# Patient Record
Sex: Female | Born: 1970
Health system: Southern US, Community
[De-identification: ages and names within clinical notes are randomized; demographics above are authoritative.]

## PROBLEM LIST (undated history)

## (undated) DIAGNOSIS — E079 Disorder of thyroid, unspecified: Secondary | ICD-10-CM

## (undated) DIAGNOSIS — J45909 Unspecified asthma, uncomplicated: Secondary | ICD-10-CM

## (undated) DIAGNOSIS — E119 Type 2 diabetes mellitus without complications: Secondary | ICD-10-CM

## (undated) DIAGNOSIS — L0390 Cellulitis, unspecified: Secondary | ICD-10-CM

## (undated) DIAGNOSIS — B86 Scabies: Secondary | ICD-10-CM

## (undated) HISTORY — DX: Disorder of thyroid, unspecified: E07.9

---

## 2001-04-06 ENCOUNTER — Emergency Department (HOSPITAL_COMMUNITY): Admission: EM | Admit: 2001-04-06 | Discharge: 2001-04-06 | Payer: Self-pay | Admitting: Emergency Medicine

## 2003-09-08 ENCOUNTER — Emergency Department (HOSPITAL_COMMUNITY): Admission: EM | Admit: 2003-09-08 | Discharge: 2003-09-08 | Payer: Self-pay | Admitting: Emergency Medicine

## 2015-07-20 ENCOUNTER — Emergency Department (HOSPITAL_COMMUNITY)
Admission: EM | Admit: 2015-07-20 | Discharge: 2015-07-20 | Discharge status: Against Medical Advice | Payer: Self-pay | Attending: Emergency Medicine | Admitting: Emergency Medicine

## 2015-07-20 ENCOUNTER — Encounter (HOSPITAL_COMMUNITY): Payer: Self-pay | Admitting: Emergency Medicine

## 2015-07-20 DIAGNOSIS — R21 Rash and other nonspecific skin eruption: Secondary | ICD-10-CM | POA: Insufficient documentation

## 2015-07-20 DIAGNOSIS — J45909 Unspecified asthma, uncomplicated: Secondary | ICD-10-CM | POA: Insufficient documentation

## 2015-07-20 DIAGNOSIS — R000 Tachycardia, unspecified: Secondary | ICD-10-CM | POA: Insufficient documentation

## 2015-07-20 DIAGNOSIS — L299 Pruritus, unspecified: Secondary | ICD-10-CM | POA: Insufficient documentation

## 2015-07-20 DIAGNOSIS — Z720 Tobacco use: Secondary | ICD-10-CM | POA: Insufficient documentation

## 2015-07-20 HISTORY — DX: Unspecified asthma, uncomplicated: J45.909

## 2015-07-20 LAB — CBG MONITORING, ED: Glucose-Capillary: 142 mg/dL — ABNORMAL HIGH (ref 65–99)

## 2015-07-20 NOTE — ED Provider Notes (Signed)
CSN: [id number]     Arrival date & time 07/20/15  1417 History   First MD Initiated Contact with Patient 07/20/15 1520     Chief Complaint  Patient presents with  . Rash   HPI Pt has had trouble with a rah in lower extremity for a few months.  The rash is itchy and raised.  It recently in the last couple of weeks started involving her arms.  There are raised area there as well that itch.  No trouble with her breathing.  No known exposure to chemicals.  NO history of rash problems in the past.  No shortness of breath.  No fever or pain.  She has also noticed swelling in her ankle areas recently. Past Medical History  Diagnosis Date  . Asthma    History reviewed. No pertinent past surgical history. No family history on file. Social History  Substance Use Topics  . Smoking status: Current Every Day Smoker -- 0.50 packs/day    Types: Cigarettes  . Smokeless tobacco: None  . Alcohol Use: Yes     Comment: occ   OB History    No data available     Review of Systems  Constitutional: Negative for fever.  Respiratory: Negative for chest tightness.   Cardiovascular: Negative for chest pain.  Genitourinary: Negative for dysuria.  All other systems reviewed and are negative.     Allergies  Review of patient's allergies indicates no known allergies.  Home Medications   Prior to Admission medications   Medication Sig Start Date End Date Taking? Authorizing Provider  diphenhydrAMINE (BENADRYL) 25 mg capsule Take 50 mg by mouth every 6 (six) hours as needed for itching.   Yes Historical Provider, MD  ibuprofen (ADVIL,MOTRIN) 200 MG tablet Take 600 mg by mouth every 8 (eight) hours as needed for mild pain.   Yes Historical Provider, MD   BP 142/83 mmHg  Pulse 127  Temp(Src) 98.2 F (36.8 C) (Oral)  Resp 18  Ht  (1.6 m)  Wt 140 lb (63.504 kg)  BMI 24.81 kg/m2  SpO2 98%  LMP 07/18/2015 Physical Exam  Constitutional: She appears well-developed and well-nourished. No  distress.  HENT:  Head: Normocephalic and atraumatic.  Right Ear: External ear normal.  Left Ear: External ear normal.  Eyes: Conjunctivae are normal. Right eye exhibits no discharge. Left eye exhibits no discharge. No scleral icterus.  Neck: Neck supple. No tracheal deviation present.  Cardiovascular: Regular rhythm and intact distal pulses.  Tachycardia present.   Pulmonary/Chest: Effort normal and breath sounds normal. No stridor. No respiratory distress. She has no wheezes. She has no rales.  Abdominal: Soft. Bowel sounds are normal. She exhibits no distension. There is no tenderness. There is no rebound and no guarding.  Musculoskeletal: She exhibits no edema or tenderness.  Neurological: She is alert. She has normal strength. No cranial nerve deficit (no facial droop, extraocular movements intact, no slurred speech) or sensory deficit. She exhibits normal muscle tone. She displays no seizure activity. Coordination normal.  Skin: Skin is warm and dry. Rash noted. No bruising, no ecchymosis and no petechiae noted. Rash is papular. Rash is not pustular, not vesicular and not urticarial. No erythema.  Multiple raised areas on upper and lower extremities, areas of excoriation on the lower extremities  Psychiatric: She has a normal mood and affect.  Nursing note and vitals reviewed.   ED Course  Procedures (including critical care time) Labs Review   MDM   I am not  certain what is causing the patient's rash.  I think she would benefit from seeing a dermatologist.  I suggested this to the patient but she does not have insurance.  The leg swelling is most likely related to her skin rash.  No sign of infection.  She was tachycardic.  Will basic labscheck labs and EKG to assess liver and renal function.  After I left the room, the patient ended up deciding to leave.  She did not get any of her tests done     Linwood Dibbles, MD 07/20/15 [phone]

## 2015-07-20 NOTE — ED Notes (Addendum)
Pt reports bilateral lower extremity edema x 2 weeks. Pt noted to have multiple wounds to bilateral feet. Pt unsure if she is diabetic. Pt also reports an itchy, raised rash to bilateral upper/ lower extremities and chin. Denies SOB. CBG 142

## 2015-07-20 NOTE — ED Notes (Signed)
Patient and family states "doctor came into room and said he didn't know much about rashes." Patient refused EKG/blood work. Patient informed Clinical research associate she is going to [facility] for treatment. Writer offered to let patient/family speak with doctor/charge nurse- patient/family refused. Patient stable and ambulatory at discharge.

## 2015-08-14 ENCOUNTER — Emergency Department (HOSPITAL_COMMUNITY)
Admission: EM | Admit: 2015-08-14 | Discharge: 2015-08-15 | Disposition: A | Discharge status: Home / Self Care | Payer: Self-pay | Attending: Emergency Medicine | Admitting: Emergency Medicine

## 2015-08-14 ENCOUNTER — Encounter (HOSPITAL_COMMUNITY): Payer: Self-pay | Admitting: Emergency Medicine

## 2015-08-14 DIAGNOSIS — R21 Rash and other nonspecific skin eruption: Secondary | ICD-10-CM | POA: Insufficient documentation

## 2015-08-14 DIAGNOSIS — J45901 Unspecified asthma with (acute) exacerbation: Secondary | ICD-10-CM | POA: Insufficient documentation

## 2015-08-14 DIAGNOSIS — F17210 Nicotine dependence, cigarettes, uncomplicated: Secondary | ICD-10-CM | POA: Insufficient documentation

## 2015-08-14 DIAGNOSIS — R220 Localized swelling, mass and lump, head: Secondary | ICD-10-CM | POA: Insufficient documentation

## 2015-08-14 NOTE — ED Notes (Signed)
Pt states she woke thinking her throat was closing up and rash all over. Pt was diagnosed with scabies x2 weeks ago.

## 2015-08-14 NOTE — ED Provider Notes (Signed)
CSN: [id number]     Arrival date & time 08/14/15  2350 History  By signing my name below, I, Budd Palmer, attest that this documentation has been prepared under the direction and in the presence of Loren Racer, MD. Electronically Signed: Budd Palmer, ED Scribe. 08/15/2015. 12:15 AM.     Chief Complaint  Patient presents with  . Allergic Reaction   The history is provided by the patient. No language interpreter was used.   HPI Comments: Linda Lin is a 44 y.o. female smoker at 0.5 ppd with a PMHx of asthma who presents to the Emergency Department complaining of an allergic reaction onset 1 day ago. She notes she was diagnosed with scabies 2 weeks ago. Was given permethrin cream. Rash improved. States she began noticing a similar rash 2 days ago starting on her hands and progressing up her arms. Also noted to the top of her chest, upper abdomen and face.  She notes she woke up 24 hours ago with the sensation of tongue swelling, and woke up with the same tonight, which is why she came to the ED. She notes she has been washing and cleaning around the house. Denies shortness of breath. No nausea or vomiting.  Past Medical History  Diagnosis Date  . Asthma    History reviewed. No pertinent past surgical history. No family history on file. Social History  Substance Use Topics  . Smoking status: Current Every Day Smoker -- 0.50 packs/day    Types: Cigarettes  . Smokeless tobacco: None  . Alcohol Use: Yes     Comment: occ   OB History    No data available     Review of Systems  Constitutional: Negative for fever and chills.  HENT: Negative for facial swelling and trouble swallowing.   Respiratory: Positive for wheezing. Negative for cough and shortness of breath.   Gastrointestinal: Negative for nausea, vomiting and abdominal pain.  Musculoskeletal: Negative for back pain, neck pain and neck stiffness.  Skin: Positive for rash.  Neurological: Negative for dizziness,  weakness, light-headedness, numbness and headaches.  All other systems reviewed and are negative.   Allergies  Review of patient's allergies indicates no known allergies.  Home Medications   Prior to Admission medications   Medication Sig Start Date End Date Taking? Authorizing Provider  diphenhydrAMINE (BENADRYL) 25 mg capsule Take 50 mg by mouth every 6 (six) hours as needed for itching.   Yes Historical Provider, MD  ibuprofen (ADVIL,MOTRIN) 200 MG tablet Take 600 mg by mouth every 8 (eight) hours as needed for mild pain.   Yes Historical Provider, MD  famotidine (PEPCID) 20 MG tablet Take 1 tablet (20 mg total) by mouth daily. 08/15/15   Loren Racer, MD  predniSONE (DELTASONE) 50 MG tablet Take 1 tablet daily 5 days 08/15/15   Loren Racer, MD  pyrethrins-piperonyl butoxide 0.5 % bottle Apply topically once. 08/15/15   Loren Racer, MD   BP 130/84 mmHg  Pulse 107  Temp(Src) 98.1 F (36.7 C) (Oral)  Resp 20  Ht 5\' 3"  (1.6 m)  Wt 139 lb (63.05 kg)  BMI 24.63 kg/m2  SpO2 97%  LMP 07/10/2015 Physical Exam  Constitutional: She is oriented to person, place, and time. She appears well-developed and well-nourished. No distress.  HENT:  Head: Normocephalic and atraumatic.  Mouth/Throat: Oropharynx is clear and moist.  No intraoral swelling or facial swelling. Patient speaks in a clear voice.  Eyes: EOM are normal. Pupils are equal, round, and reactive to light.  Neck: Normal range of motion. Neck supple.  Cardiovascular: Regular rhythm.  Exam reveals no gallop and no friction rub.   No murmur heard. Tachycardia  Pulmonary/Chest: Effort normal. No stridor. No respiratory distress. She has wheezes (few scattered expiratory wheezes). She has no rales.  Abdominal: Soft. Bowel sounds are normal. She exhibits no distension and no mass. There is no tenderness. There is no rebound and no guarding.  Musculoskeletal: Normal range of motion. She exhibits no edema or tenderness.   Neurological: She is alert and oriented to person, place, and time.  Moves all extremities without deficit. Sensation is fully intact.  Skin: Skin is warm and dry. Rash noted. No erythema.  Patient has papular lesions on bilateral hands and forearms. Also noted on face and chest. No erythema or evidence of infectious process. No interdigital lesions.   Psychiatric: She has a normal mood and affect. Her behavior is normal.  Nursing note and vitals reviewed.   ED Course  Procedures  DIAGNOSTIC STUDIES: Oxygen Saturation is 100% on RA, normal by my interpretation.    COORDINATION OF CARE: 12:14 AM - Discussed plans to order 2 doses of promethazine creme. Pt advised of plan for treatment and pt agrees.  Labs Review Labs Reviewed - No data to display  Imaging Review No results found. I have personally reviewed and evaluated these images and lab results as part of my medical decision-making.   EKG Interpretation None      MDM   Final diagnoses:  Rash and nonspecific skin eruption    I personally performed the services described in this documentation, which was scribed in my presence. The recorded information has been reviewed and is accurate.   Patient treated for possible allergic reaction. No evidence of intraoral swelling or airway compromise. Patient states sensation of tongue swelling has abated. We'll re-dose permethrin cream for diffuse rash. Patient has been advised that she needs to thoroughly clean all linens, clothes. Return precautions given.  Loren Racer, MD 08/15/15 [phone]

## 2015-08-15 MED ORDER — METHYLPREDNISOLONE SODIUM SUCC 125 MG IJ SOLR
125.0000 mg | Freq: Once | INTRAMUSCULAR | Status: AC
  Administered 2015-08-15: 125 mg via INTRAMUSCULAR
  Filled 2015-08-15: qty 2

## 2015-08-15 MED ORDER — PREDNISONE 50 MG PO TABS: ORAL_TABLET | ORAL | Status: DC

## 2015-08-15 MED ORDER — PERMETHRIN 0.5 % AERO: INHALATION_SPRAY | Freq: Once | Status: DC

## 2015-08-15 MED ORDER — FAMOTIDINE 20 MG PO TABS: 20.0000 mg | ORAL_TABLET | Freq: Every day | ORAL | Status: DC

## 2015-08-15 MED ORDER — FAMOTIDINE 20 MG PO TABS
20.0000 mg | ORAL_TABLET | Freq: Once | ORAL | Status: AC
  Administered 2015-08-15: 20 mg via ORAL
  Filled 2015-08-15: qty 1

## 2015-08-15 MED ORDER — ALBUTEROL SULFATE (2.5 MG/3ML) 0.083% IN NEBU
2.5000 mg | INHALATION_SOLUTION | Freq: Once | RESPIRATORY_TRACT | Status: AC
  Administered 2015-08-15: 2.5 mg via RESPIRATORY_TRACT
  Filled 2015-08-15: qty 3

## 2015-08-15 MED ORDER — DIPHENHYDRAMINE HCL 25 MG PO CAPS
25.0000 mg | ORAL_CAPSULE | Freq: Once | ORAL | Status: AC
  Administered 2015-08-15: 25 mg via ORAL
  Filled 2015-08-15: qty 1

## 2015-08-15 NOTE — Discharge Instructions (Signed)
Allergies An allergy is an abnormal reaction to a substance by the body's defense system (immune system). Allergies can develop at any age. WHAT CAUSES ALLERGIES? An allergic reaction happens when the immune system mistakenly reacts to a normally harmless substance, called an allergen, as if it were harmful. The immune system releases antibodies to fight the substance. Antibodies eventually release a chemical called histamine into the bloodstream. The release of histamine is meant to protect the body from infection, but it also causes discomfort. An allergic reaction can be triggered by:  Eating an allergen.  Inhaling an allergen.  Touching an allergen. WHAT TYPES OF ALLERGIES ARE THERE? There are many types of allergies. Common types include:  Seasonal allergies. People with this type of allergy are usually allergic to substances that are only present during certain seasons, such as molds and pollens.  Food allergies.  Drug allergies.  Insect allergies.  Animal dander allergies. WHAT ARE SYMPTOMS OF ALLERGIES? Possible allergy symptoms include:  Swelling of the lips, face, tongue, mouth, or throat.  Sneezing, coughing, or wheezing.  Nasal congestion.  Tingling in the mouth.  Rash.  Itching.  Itchy, red, swollen areas of skin (hives).  Watery eyes.  Vomiting.  Diarrhea.  Dizziness.  Lightheadedness.  Fainting.  Trouble breathing or swallowing.  Chest tightness.  Rapid heartbeat. HOW ARE ALLERGIES DIAGNOSED? Allergies are diagnosed with a medical and family history and one or more of the following:  Skin tests.  Blood tests.  A food diary. A food diary is a record of all the foods and drinks you have in a day and of all the symptoms you experience.  The results of an elimination diet. An elimination diet involves eliminating foods from your diet and then adding them back in one by one to find out if a certain food causes an allergic reaction. HOW ARE  ALLERGIES TREATED? There is no cure for allergies, but allergic reactions can be treated with medicine. Severe reactions usually need to be treated at a hospital. HOW CAN REACTIONS BE PREVENTED? The best way to prevent an allergic reaction is by avoiding the substance you are allergic to. Allergy shots and medicines can also help prevent reactions in some cases. People with severe allergic reactions may be able to prevent a life-threatening reaction called anaphylaxis with a medicine given right after exposure to the allergen.   This information is not intended to replace advice given to you by your health care provider. Make sure you discuss any questions you have with your health care provider.   Document Released: 12/03/2002 Document Revised: 09/30/2014 Document Reviewed: 06/21/2014 Elsevier Interactive Patient Education 2016 [employer]. Scabies, Adult Scabies is a skin condition that happens when very small insects get under the skin (infestation). This causes a rash and severe itchiness. Scabies can spread from person to person (is contagious). If you get scabies, it is common for others in your household to get scabies too. With proper treatment, symptoms usually go away in 2-4 weeks. Scabies usually does not cause lasting problems. CAUSES This condition is caused by mites (Sarcoptes scabiei, or human itch mites) that can only be seen with a microscope. The mites get into the top layer of skin and lay eggs. Scabies can spread from person to person through:  Close contact with a person who has scabies.  Contact with infested items, such as towels, bedding, or clothing. RISK FACTORS This condition is more likely to develop in:  People who live in nursing homes and other  extended-care facilities.  People who have sexual contact with a partner who has scabies.  Young children who attend child care facilities.  People who care for others who are at increased risk for  scabies. SYMPTOMS Symptoms of this condition may include:  Severe itchiness. This is often worse at night.  A rash that includes tiny red bumps or blisters. The rash commonly occurs on the wrist, elbow, armpit, fingers, waist, groin, or buttocks. Bumps may form a line (burrow) in some areas.  Skin irritation. This can include scaly patches or sores. DIAGNOSIS This condition is diagnosed with a physical exam. Your health care provider will look closely at your skin. In some cases, your health care provider may take a sample of your affected skin (skin scraping) and have it examined under a microscope. TREATMENT This condition may be treated with:  Medicated cream or lotion that kills the mites. This is spread on the entire body and left on for several hours. Usually, one treatment with medicated cream or lotion is enough to kill all of the mites. In severe cases, the treatment may be repeated.  Medicated cream that relieves itching.  Medicines that help to relieve itching.  Medicines that kill the mites. This treatment is rarely used. HOME CARE INSTRUCTIONS Medicines  Take or apply over-the-counter and prescription medicines as told by your health care provider.  Apply medicated cream or lotion as told by your health care provider.  Do not wash off the medicated cream or lotion until the necessary amount of time has passed. Skin Care  Avoid scratching your affected skin.  Keep your fingernails closely trimmed to reduce injury from scratching.  Take cool baths or apply cool washcloths to help reduce itching. General Instructions  Clean all items that you recently had contact with, including bedding, clothing, and furniture. Do this on the same day that your treatment starts.  Use hot water when you wash items.  Place unwashable items into closed, airtight plastic bags for at least 3 days. The mites cannot live for more than 3 days away from human skin.  Vacuum furniture and  mattresses that you use.  Make sure that other people who may have been infested are examined by a health care provider. These include members of your household and anyone who may have had contact with infested items.  Keep all follow-up visits as told by your health care provider. This is important. SEEK MEDICAL CARE IF:  You have itching that does not go away after 4 weeks of treatment.  You continue to develop new bumps or burrows.  You have redness, swelling, or pain in your rash area after treatment.  You have fluid, blood, or pus coming from your rash.   This information is not intended to replace advice given to you by your health care provider. Make sure you discuss any questions you have with your health care provider.   Document Released: 05/31/2015 Document Reviewed: 04/11/2015 Elsevier Interactive Patient Education [employer]

## 2015-08-15 NOTE — ED Notes (Signed)
Respiratory at bedside.

## 2015-08-16 ENCOUNTER — Telehealth (HOSPITAL_COMMUNITY): Payer: Self-pay

## 2015-08-16 NOTE — Telephone Encounter (Signed)
Pt calling thought she was supposed to get Rx for Permethrin cream but got a shampoo.  Chart reviewed w/ C. Lawyer PA "Permethrin topical 5% cream 60 grams Apply all over except eyes and mouth  Reapply in 2 wks if needed.  Rx called to [facility] [phone] and given to RPh.

## 2015-08-24 ENCOUNTER — Emergency Department (HOSPITAL_COMMUNITY)
Admission: EM | Admit: 2015-08-24 | Discharge: 2015-08-25 | Disposition: A | Discharge status: Home / Self Care | Payer: Self-pay | Attending: Emergency Medicine | Admitting: Emergency Medicine

## 2015-08-24 ENCOUNTER — Encounter (HOSPITAL_COMMUNITY): Payer: Self-pay | Admitting: Emergency Medicine

## 2015-08-24 DIAGNOSIS — L03115 Cellulitis of right lower limb: Secondary | ICD-10-CM

## 2015-08-24 DIAGNOSIS — J45909 Unspecified asthma, uncomplicated: Secondary | ICD-10-CM | POA: Insufficient documentation

## 2015-08-24 DIAGNOSIS — Z79899 Other long term (current) drug therapy: Secondary | ICD-10-CM | POA: Insufficient documentation

## 2015-08-24 DIAGNOSIS — L03116 Cellulitis of left lower limb: Secondary | ICD-10-CM | POA: Insufficient documentation

## 2015-08-24 DIAGNOSIS — F17210 Nicotine dependence, cigarettes, uncomplicated: Secondary | ICD-10-CM | POA: Insufficient documentation

## 2015-08-24 LAB — CBC WITH DIFFERENTIAL/PLATELET
BASOS ABS: 0 10*3/uL (ref 0.0–0.1)
BASOS PCT: 0 %
EOS ABS: 0.3 10*3/uL (ref 0.0–0.7)
Eosinophils Relative: 5 %
HEMATOCRIT: 39.4 % (ref 36.0–46.0)
HEMOGLOBIN: 12.8 g/dL (ref 12.0–15.0)
Lymphocytes Relative: 42 %
Lymphs Abs: 2.4 10*3/uL (ref 0.7–4.0)
MCH: 29.9 pg (ref 26.0–34.0)
MCHC: 32.5 g/dL (ref 30.0–36.0)
MCV: 92.1 fL (ref 78.0–100.0)
Monocytes Absolute: 1 10*3/uL (ref 0.1–1.0)
Monocytes Relative: 18 %
NEUTROS ABS: 2 10*3/uL (ref 1.7–7.7)
NEUTROS PCT: 35 %
Platelets: 238 10*3/uL (ref 150–400)
RBC: 4.28 MIL/uL (ref 3.87–5.11)
RDW: 12.8 % (ref 11.5–15.5)
WBC: 5.8 10*3/uL (ref 4.0–10.5)

## 2015-08-24 LAB — BASIC METABOLIC PANEL
ANION GAP: 7 (ref 5–15)
BUN: 16 mg/dL (ref 6–20)
CALCIUM: 8.8 mg/dL — AB (ref 8.9–10.3)
CO2: 26 mmol/L (ref 22–32)
Chloride: 107 mmol/L (ref 101–111)
Creatinine, Ser: 0.56 mg/dL (ref 0.44–1.00)
Glucose, Bld: 116 mg/dL — ABNORMAL HIGH (ref 65–99)
Potassium: 4.1 mmol/L (ref 3.5–5.1)
SODIUM: 140 mmol/L (ref 135–145)

## 2015-08-24 MED ORDER — IBUPROFEN 800 MG PO TABS
800.0000 mg | ORAL_TABLET | Freq: Once | ORAL | Status: AC
  Administered 2015-08-24: 800 mg via ORAL
  Filled 2015-08-24: qty 1

## 2015-08-24 MED ORDER — VANCOMYCIN HCL IN DEXTROSE 1-5 GM/200ML-% IV SOLN
1000.0000 mg | Freq: Once | INTRAVENOUS | Status: AC
  Administered 2015-08-25: 1000 mg via INTRAVENOUS
  Filled 2015-08-24: qty 200

## 2015-08-24 NOTE — ED Notes (Signed)
Pt legs bilateral red, sore, swelling, pus and draining,  Denies fever, chills at times, pt was treated last week for scabies, states the rash is better, unsure if this came from medication or infection from scabies

## 2015-08-25 MED ORDER — HYDROCODONE-ACETAMINOPHEN 5-325 MG PO TABS: ORAL_TABLET | ORAL | Status: DC

## 2015-08-25 MED ORDER — DOXYCYCLINE HYCLATE 100 MG PO CAPS: 100.0000 mg | ORAL_CAPSULE | Freq: Two times a day (BID) | ORAL | Status: DC

## 2015-08-25 NOTE — Discharge Instructions (Signed)
Cellulitis °Cellulitis is an infection of the skin and the tissue under the skin. The infected area is usually red and tender. This happens most often in the arms and lower legs. °HOME CARE  °· Take your antibiotic medicine as told. Finish the medicine even if you start to feel better. °· Keep the infected arm or leg raised (elevated). °· Put a warm cloth on the area up to 4 times per day. °· Only take medicines as told by your doctor. °· Keep all doctor visits as told. °GET HELP IF: °· You see red streaks on the skin coming from the infected area. °· Your red area gets bigger or turns a dark color. °· Your bone or joint under the infected area is painful after the skin heals. °· Your infection comes back in the same area or different area. °· You have a puffy (swollen) bump in the infected area. °· You have new symptoms. °· You have a fever. °GET HELP RIGHT AWAY IF:  °· You feel very sleepy. °· You throw up (vomit) or have watery poop (diarrhea). °· You feel sick and have muscle aches and pains. °  °This information is not intended to replace advice given to you by your health care provider. Make sure you discuss any questions you have with your health care provider. °  °Document Released: 02/26/2008 Document Revised: 05/31/2015 Document Reviewed: 11/25/2011 °Elsevier Interactive Patient Education ©2016 Elsevier Inc. ° °

## 2015-08-26 NOTE — ED Provider Notes (Signed)
CSN: [id number]     Arrival date & time 08/24/15  1858 History   First MD Initiated Contact with Patient 08/24/15 2137     Chief Complaint  Patient presents with  . Cellulitis     (Consider location/radiation/quality/duration/timing/severity/associated sxs/prior Treatment) HPI   Linda Lin is a 44 y.o. female who presents to the Emergency Department complaining of redness, swelling and itching of the bilateral lower legs for several days.  She states that she was seen here over one week ago and also treated at another facility for scabies.  Treated with permetrin cream x 2 prior to onset of itching and redness of her legs.  She states the previous rash is improving after treatment, but states that she was having severe itching of her legs and was scratching frequently.  She has also noticed the swelling increases with standing and improves with rest.  She noticed clear drainage and oozing from her legs for two days.  She denies fever, vomiting, numbness or weakness of the extremities.    Past Medical History  Diagnosis Date  . Asthma    History reviewed. No pertinent past surgical history. No family history on file. Social History  Substance Use Topics  . Smoking status: Current Every Day Smoker -- 0.50 packs/day    Types: Cigarettes  . Smokeless tobacco: None  . Alcohol Use: Yes     Comment: occ   OB History    No data available     Review of Systems  Constitutional: Positive for chills. Negative for fever.  Respiratory: Negative for chest tightness and shortness of breath.   Cardiovascular: Negative for chest pain.  Gastrointestinal: Negative for nausea, vomiting and abdominal pain.  Genitourinary: Negative for dysuria.  Musculoskeletal: Positive for myalgias.  Skin: Positive for color change and rash.  Neurological: Negative for dizziness, weakness and numbness.  All other systems reviewed and are negative.     Allergies  Review of patient's allergies  indicates no known allergies.  Home Medications   Prior to Admission medications   Medication Sig Start Date End Date Taking? Authorizing Provider  diphenhydrAMINE (BENADRYL) 25 mg capsule Take 50 mg by mouth every 6 (six) hours as needed for itching.   Yes Historical Provider, MD  famotidine (PEPCID) 20 MG tablet Take 1 tablet (20 mg total) by mouth daily. 08/15/15  Yes Loren Racer, MD  ibuprofen (ADVIL,MOTRIN) 200 MG tablet Take 600 mg by mouth every 8 (eight) hours as needed for mild pain.   Yes Historical Provider, MD  doxycycline (VIBRAMYCIN) 100 MG capsule Take 1 capsule (100 mg total) by mouth 2 (two) times daily. For 10 days 08/25/15   Pauline Aus, PA-C  HYDROcodone-acetaminophen (NORCO/VICODIN) 5-325 MG tablet Take one-two tabs po q 4-6 hrs prn pain 08/25/15   Damiya Sandefur, PA-C  predniSONE (DELTASONE) 50 MG tablet Take 1 tablet daily 5 days Patient not taking: Reported on 08/24/2015 08/15/15   Loren Racer, MD  pyrethrins-piperonyl butoxide 0.5 % bottle Apply topically once. Patient not taking: Reported on 08/24/2015 08/15/15   Loren Racer, MD   BP 138/69 mmHg  Pulse 95  Temp(Src) 98.3 F (36.8 C) (Oral)  Resp 16  Ht  (1.6 m)  Wt 63.504 kg  BMI 24.81 kg/m2  SpO2 99%  LMP 08/17/2015   Physical Exam  Constitutional: She is oriented to person, place, and time. She appears well-developed and well-nourished. No distress.  HENT:  Head: Atraumatic.  Mouth/Throat: Oropharynx is clear and moist.  Neck: Normal  range of motion. Neck supple.  Cardiovascular: Normal rate, regular rhythm and intact distal pulses.   Pulmonary/Chest: Effort normal. No respiratory distress. She has no wheezes.  Musculoskeletal:  Confluent, mild erythema of the bilateral LE's, few excoriations and mild serous drainage.  Mild edema of the dorsal feet.  DP pulses brisk, distal sensation intact.  Compartments soft.    Neurological: She is alert and oriented to person, place, and time. She  exhibits normal muscle tone. Coordination normal.  Skin: Skin is warm.    ED Course  Procedures (including critical care time) Labs Review Labs Reviewed  BASIC METABOLIC PANEL - Abnormal; Notable for the following:    Glucose, Bld 116 (*)    Calcium 8.8 (*)    All other components within normal limits  CBC WITH DIFFERENTIAL/PLATELET    Imaging Review No results found. I have personally reviewed and evaluated these images and lab results as part of my medical decision-making.   EKG Interpretation None      MDM   Final diagnoses:  Cellulitis of both lower extremities    Pt with likely cellulitis of the bilateral LE's secondary to scratching.  NV and NS intact.  Ambulates with a steady gait.  IV vancomycin given here, labs are reassuring.  Advised to elevate, warm compresses and to return here for worsening symptoms.      Pauline Aus, PA-C 08/26/15 2042  Vanetta Mulders, MD 08/30/15 [phone]

## 2015-08-27 ENCOUNTER — Inpatient Hospital Stay (HOSPITAL_COMMUNITY)
Admission: EM | Admit: 2015-08-27 | Discharge: 2015-08-31 | DRG: 603 | Disposition: A | Discharge status: Home / Self Care | Payer: Self-pay | Attending: Internal Medicine | Admitting: Internal Medicine

## 2015-08-27 ENCOUNTER — Encounter (HOSPITAL_COMMUNITY): Payer: Self-pay | Admitting: Emergency Medicine

## 2015-08-27 DIAGNOSIS — R21 Rash and other nonspecific skin eruption: Secondary | ICD-10-CM | POA: Diagnosis present

## 2015-08-27 DIAGNOSIS — L309 Dermatitis, unspecified: Secondary | ICD-10-CM | POA: Diagnosis present

## 2015-08-27 DIAGNOSIS — Z720 Tobacco use: Secondary | ICD-10-CM | POA: Diagnosis present

## 2015-08-27 DIAGNOSIS — F17210 Nicotine dependence, cigarettes, uncomplicated: Secondary | ICD-10-CM | POA: Diagnosis present

## 2015-08-27 DIAGNOSIS — L299 Pruritus, unspecified: Secondary | ICD-10-CM | POA: Diagnosis present

## 2015-08-27 DIAGNOSIS — L03116 Cellulitis of left lower limb: Secondary | ICD-10-CM | POA: Diagnosis present

## 2015-08-27 DIAGNOSIS — L0390 Cellulitis, unspecified: Secondary | ICD-10-CM | POA: Diagnosis present

## 2015-08-27 DIAGNOSIS — L03115 Cellulitis of right lower limb: Principal | ICD-10-CM | POA: Diagnosis present

## 2015-08-27 HISTORY — DX: Cellulitis, unspecified: L03.90

## 2015-08-27 HISTORY — DX: Scabies: B86

## 2015-08-27 LAB — CBC WITH DIFFERENTIAL/PLATELET
BASOS PCT: 0 %
Band Neutrophils: 0 %
Basophils Absolute: 0 10*3/uL (ref 0.0–0.1)
Blasts: 0 %
EOS PCT: 5 %
Eosinophils Absolute: 0.3 10*3/uL (ref 0.0–0.7)
HCT: 41.5 % (ref 36.0–46.0)
Hemoglobin: 13.8 g/dL (ref 12.0–15.0)
LYMPHS ABS: 2.4 10*3/uL (ref 0.7–4.0)
LYMPHS PCT: 35 %
MCH: 30.7 pg (ref 26.0–34.0)
MCHC: 33.3 g/dL (ref 30.0–36.0)
MCV: 92.2 fL (ref 78.0–100.0)
MONO ABS: 0.7 10*3/uL (ref 0.1–1.0)
Metamyelocytes Relative: 0 %
Monocytes Relative: 10 %
Myelocytes: 0 %
NEUTROS PCT: 50 %
NRBC: 0 /100{WBCs}
Neutro Abs: 3.4 10*3/uL (ref 1.7–7.7)
OTHER: 0 %
PLATELETS: 243 10*3/uL (ref 150–400)
Promyelocytes Absolute: 0 %
RBC: 4.5 MIL/uL (ref 3.87–5.11)
RDW: 12.7 % (ref 11.5–15.5)
WBC: 6.8 10*3/uL (ref 4.0–10.5)

## 2015-08-27 LAB — COMPREHENSIVE METABOLIC PANEL
ALT: 14 U/L (ref 14–54)
ANION GAP: 8 (ref 5–15)
AST: 17 U/L (ref 15–41)
Albumin: 3.2 g/dL — ABNORMAL LOW (ref 3.5–5.0)
Alkaline Phosphatase: 97 U/L (ref 38–126)
BUN: 17 mg/dL (ref 6–20)
CALCIUM: 9.3 mg/dL (ref 8.9–10.3)
CHLORIDE: 106 mmol/L (ref 101–111)
CO2: 25 mmol/L (ref 22–32)
Creatinine, Ser: 0.67 mg/dL (ref 0.44–1.00)
GFR calc non Af Amer: >60 mL/min (ref >60)
Glucose, Bld: 93 mg/dL (ref 65–99)
Potassium: 4 mmol/L (ref 3.5–5.1)
SODIUM: 139 mmol/L (ref 135–145)
Total Bilirubin: 0.4 mg/dL (ref 0.3–1.2)
Total Protein: 6.8 g/dL (ref 6.5–8.1)

## 2015-08-27 MED ORDER — FAMOTIDINE IN NACL 20-0.9 MG/50ML-% IV SOLN
INTRAVENOUS | Status: AC
  Filled 2015-08-27: qty 50

## 2015-08-27 MED ORDER — ONDANSETRON HCL 4 MG/2ML IJ SOLN: 4.0000 mg | Freq: Four times a day (QID) | INTRAMUSCULAR | Status: DC | PRN

## 2015-08-27 MED ORDER — HYDROXYZINE HCL 25 MG PO TABS
25.0000 mg | ORAL_TABLET | Freq: Three times a day (TID) | ORAL | Status: DC
  Administered 2015-08-27 – 2015-08-28 (×2): 25 mg via ORAL
  Filled 2015-08-27 (×2): qty 1

## 2015-08-27 MED ORDER — VANCOMYCIN HCL IN DEXTROSE 750-5 MG/150ML-% IV SOLN
750.0000 mg | Freq: Two times a day (BID) | INTRAVENOUS | Status: DC
  Administered 2015-08-28 – 2015-08-31 (×7): 750 mg via INTRAVENOUS
  Filled 2015-08-27 (×9): qty 150

## 2015-08-27 MED ORDER — ACETAMINOPHEN 650 MG RE SUPP: 650.0000 mg | Freq: Four times a day (QID) | RECTAL | Status: DC | PRN

## 2015-08-27 MED ORDER — ENOXAPARIN SODIUM 30 MG/0.3ML ~~LOC~~ SOLN
30.0000 mg | SUBCUTANEOUS | Status: DC
  Administered 2015-08-27: 30 mg via SUBCUTANEOUS
  Filled 2015-08-27: qty 0.3

## 2015-08-27 MED ORDER — ALUM & MAG HYDROXIDE-SIMETH 200-200-20 MG/5ML PO SUSP: 30.0000 mL | Freq: Four times a day (QID) | ORAL | Status: DC | PRN

## 2015-08-27 MED ORDER — SODIUM CHLORIDE 0.9 % IV SOLN
INTRAVENOUS | Status: DC
  Administered 2015-08-27 – 2015-08-29 (×4): via INTRAVENOUS

## 2015-08-27 MED ORDER — HYDROMORPHONE HCL 1 MG/ML IJ SOLN
0.5000 mg | INTRAMUSCULAR | Status: DC | PRN
  Administered 2015-08-27: 1 mg via INTRAVENOUS
  Filled 2015-08-27: qty 1

## 2015-08-27 MED ORDER — VANCOMYCIN HCL IN DEXTROSE 1-5 GM/200ML-% IV SOLN
1000.0000 mg | Freq: Once | INTRAVENOUS | Status: AC
  Administered 2015-08-27: 1000 mg via INTRAVENOUS
  Filled 2015-08-27: qty 200

## 2015-08-27 MED ORDER — VANCOMYCIN HCL IN DEXTROSE 1-5 GM/200ML-% IV SOLN
1000.0000 mg | Freq: Once | INTRAVENOUS | Status: AC
  Administered 2015-08-27: 1000 mg via INTRAVENOUS

## 2015-08-27 MED ORDER — IVERMECTIN 3 MG PO TABS
200.0000 ug/kg | ORAL_TABLET | Freq: Once | ORAL | Status: DC
Start: 2015-08-27 — End: 2015-08-31
  Filled 2015-08-27: qty 4

## 2015-08-27 MED ORDER — VANCOMYCIN HCL IN DEXTROSE 1-5 GM/200ML-% IV SOLN
INTRAVENOUS | Status: AC
  Filled 2015-08-27: qty 200

## 2015-08-27 MED ORDER — ONDANSETRON HCL 4 MG PO TABS: 4.0000 mg | ORAL_TABLET | Freq: Four times a day (QID) | ORAL | Status: DC | PRN

## 2015-08-27 MED ORDER — OXYCODONE HCL 5 MG PO TABS
5.0000 mg | ORAL_TABLET | ORAL | Status: DC | PRN
  Administered 2015-08-28 – 2015-08-29 (×4): 5 mg via ORAL
  Filled 2015-08-27 (×4): qty 1

## 2015-08-27 MED ORDER — METHYLPREDNISOLONE SODIUM SUCC 125 MG IJ SOLR
125.0000 mg | Freq: Once | INTRAMUSCULAR | Status: AC
  Administered 2015-08-28: 125 mg via INTRAVENOUS
  Filled 2015-08-27: qty 2

## 2015-08-27 MED ORDER — SODIUM CHLORIDE 0.9 % IV BOLUS (SEPSIS)
1000.0000 mL | Freq: Once | INTRAVENOUS | Status: AC
  Administered 2015-08-27: 1000 mL via INTRAVENOUS

## 2015-08-27 MED ORDER — METHYLPREDNISOLONE SODIUM SUCC 125 MG IJ SOLR
125.0000 mg | Freq: Once | INTRAMUSCULAR | Status: AC
  Administered 2015-08-27: 125 mg via INTRAVENOUS
  Filled 2015-08-27: qty 2

## 2015-08-27 MED ORDER — ACETAMINOPHEN 325 MG PO TABS: 650.0000 mg | ORAL_TABLET | Freq: Four times a day (QID) | ORAL | Status: DC | PRN

## 2015-08-27 MED ORDER — FAMOTIDINE IN NACL 20-0.9 MG/50ML-% IV SOLN
20.0000 mg | Freq: Two times a day (BID) | INTRAVENOUS | Status: DC
  Administered 2015-08-27 – 2015-08-29 (×4): 20 mg via INTRAVENOUS
  Filled 2015-08-27 (×6): qty 50

## 2015-08-27 NOTE — H&P (Signed)
[facility] Admission History and Physical       SHELVEY STAEBELL LXB:[id number] DOB: [birth year] DOA: 08/27/2015  Referring physician: EDP PCP: No PCP Per Patient  Specialists:   Chief Complaint:   HPI: Linda Lin is a 44 y.o. female who presented to the [facility] ED with complaints of worsening Rash especially on both of her lower legs despite 4 visits to the ED, and Outpatient rx with Pyrethrin  for Scabies, and then with Doxycycline for Cellulitis.    She denies any Fevers or chills.  She denies any contacts with anyone with scabies that she knows of .  Her Rash continued to spread all over her body and the rash itches.    Her legs developed blisters that burst into ulcers.   She was seen in the ED , and placed on IV Vancomycin and given 1 dose of IV Solumedrol and referred for admission.      Review of Systems:  Constitutional: No Weight Loss, No Weight Gain, Night Sweats, Fevers, Chills, Dizziness, Light Headedness, Fatigue, or Generalized Weakness HEENT: No Headaches, Difficulty Swallowing,Tooth/Dental Problems,Sore Throat,  No Sneezing, Rhinitis, Ear Ache, Nasal Congestion, or Post Nasal Drip,  Cardio-vascular:  No Chest pain, Orthopnea, PND, Edema in Lower Extremities, Anasarca, Dizziness, Palpitations  Resp: No Dyspnea, No DOE, No Productive Cough, No Non-Productive Cough, No Hemoptysis, No Wheezing.    GI: No Heartburn, Indigestion, Abdominal Pain, Nausea, Vomiting, Diarrhea, Constipation, Hematemesis, Hematochezia, Melena, Change in Bowel Habits,  Loss of Appetite  GU: No Dysuria, No Change in Color of Urine, No Urgency or Urinary Frequency, No Flank pain.  Musculoskeletal: No Joint Pain or Swelling, No Decreased Range of Motion, No Back Pain.  Neurologic: No Syncope, No Seizures, Muscle Weakness, Paresthesia, Vision Disturbance or Loss, No Diplopia, No Vertigo, No Difficulty Walking,  Skin: No Rash or Lesions. Psych: No Change in Mood or Affect, No  Depression or Anxiety, No Memory loss, No Confusion, or Hallucinations   Past Medical History  Diagnosis Date  . Asthma   . Cellulitis   . Scabies      History reviewed. No pertinent past surgical history.    Prior to Admission medications   Medication Sig Start Date End Date Taking? Authorizing Provider  diphenhydrAMINE (BENADRYL) 25 mg capsule Take 50 mg by mouth every 6 (six) hours as needed for itching.   Yes Historical Provider, MD  doxycycline (VIBRAMYCIN) 100 MG capsule Take 1 capsule (100 mg total) by mouth 2 (two) times daily. For 10 days 08/25/15  Yes Tammy Triplett, PA-C  HYDROcodone-acetaminophen (NORCO/VICODIN) 5-325 MG tablet Take one-two tabs po q 4-6 hrs prn pain 08/25/15  Yes Tammy Triplett, PA-C  famotidine (PEPCID) 20 MG tablet Take 1 tablet (20 mg total) by mouth daily. 08/15/15   Loren Racer, MD  ibuprofen (ADVIL,MOTRIN) 200 MG tablet Take 800 mg by mouth every 8 (eight) hours as needed for mild pain.     Historical Provider, MD  predniSONE (DELTASONE) 50 MG tablet Take 1 tablet daily 5 days Patient not taking: Reported on 08/24/2015 08/15/15   Loren Racer, MD  pyrethrins-piperonyl butoxide 0.5 % bottle Apply topically once. Patient not taking: Reported on 08/24/2015 08/15/15   Loren Racer, MD     No Known Allergies     Social History:  reports that she has been smoking Cigarettes.  She has a 10 pack-year smoking history. She has never used smokeless tobacco. She reports that she drinks alcohol. She reports that she does not use  illicit drugs.     History reviewed. No pertinent family history.     Physical Exam:  GEN:  Pleasant Thin Well Developed 44 y.o.  African American female examined and in no acute distress; cooperative with exam Filed Vitals:   08/27/15 1543 08/27/15 1939  BP: 108/78 129/82  Pulse: 135 109  Temp: 97.6 F (36.4 C)   TempSrc: Oral   Resp: 18 18  Height:  (1.6 m)   Weight: 63.05 kg (139 lb)   SpO2: 100% 99%    Blood pressure 129/82, pulse 109, temperature 97.6 F (36.4 C), temperature source Oral, resp. rate 18, height  (1.6 m), weight 63.05 kg (139 lb), last menstrual period 08/17/2015, SpO2 99 %. PSYCH: She is alert and oriented x4; does not appear anxious does not appear depressed; affect is normal HEENT: Normocephalic and Atraumatic, Mucous membranes pink; PERRLA; EOM intact; Fundi:  Benign;  No scleral icterus, Nares: Patent, Oropharynx: Clear, Fair Dentition,    Neck:  FROM, No Cervical Lymphadenopathy nor Thyromegaly or Carotid Bruit; No JVD; Breasts:: Not examined CHEST WALL: No tenderness CHEST: Normal respiration, clear to auscultation bilaterally HEART: Regular rate and rhythm; no murmurs rubs or gallops BACK: No kyphosis or scoliosis; No CVA tenderness ABDOMEN: Positive Bowel Sounds, Scaphoid,  Soft Non-Tender, No Rebound or Guarding; No Masses, No Organomegaly Rectal Exam: Not done EXTREMITIES: No Cyanosis, Clubbing, 2+ Edema;  Diffuse scattered Ulcerations of the BLEs Genitalia: not examined PULSES: 2+ and symmetric SKIN: Diffuse Papular Pruritic lesions all over and in Webs of fingers,  And Diffuse scattered Ulcerations of the BLE CNS:  Alert and Oriented x 4, No Focal Deficits Vascular: pulses palpable throughout    Labs on Admission:  Basic Metabolic Panel:  Recent Labs Lab 08/24/15 2228 08/27/15 1645  NA 140 139  K 4.1 4.0  CL 107 106  CO2 26 25  GLUCOSE 116* 93  BUN 16 17  CREATININE 0.56 0.67  CALCIUM 8.8* 9.3   Liver Function Tests:  Recent Labs Lab 08/27/15 1645  AST 17  ALT 14  ALKPHOS 97  BILITOT 0.4  PROT 6.8  ALBUMIN 3.2*   No results for input(s): LIPASE, AMYLASE in the last 168 hours. No results for input(s): AMMONIA in the last 168 hours. CBC:  Recent Labs Lab 08/24/15 2228 08/27/15 1645  WBC 5.8 6.8  NEUTROABS 2.0 3.4  HGB 12.8 13.8  HCT 39.4 41.5  MCV 92.1 92.2  PLT 238 243   Cardiac Enzymes: No results for input(s):  CKTOTAL, CKMB, CKMBINDEX, TROPONINI in the last 168 hours.  BNP (last 3 results) No results for input(s): BNP in the last 8760 hours.  ProBNP (last 3 results) No results for input(s): PROBNP in the last 8760 hours.  CBG: No results for input(s): GLUCAP in the last 168 hours.  Radiological Exams on Admission: No results found.      Assessment/Plan:     44 y.o. female with  Active Problems:   1.     Cellulitis of both lower extremities/Cellulitis with Possible SJS   Contact Precautions   IV Vancomycin   IV Solumedrol, IV Famotidine BID, Atarax TID   IVFs   May Need Dermatology consultation     2.     Dermatitis- Scabies that failed Pyrethrin Rx, vs Other Dermatitis   Contact Precautions   Oral Ivermectin (Single Dose Rx of 12 mg )    Repeat in 7 days   May Need Dermatology consultation     3.  DVT Prophylaxis   Lovenox    Code Status:     FULL CODE     Disposition Plan:    Inpatient  Observation Status        Time spent:  60 70 Minutes      Hanna Aultman C [facility] Pager [phone]   If 7AM -7PM Please Contact the Day Rounding Team MD for [facility]  If 7PM-7AM, Please Contact Night-Floor Coverage  [email or web address] Password [email or web address] 08/27/2015, 8:18 PM     ADDENDUM:   Patient was seen and examined on 08/27/2015

## 2015-08-27 NOTE — ED Provider Notes (Addendum)
CSN: [id number]     Arrival date & time 08/27/15  1533 History   First MD Initiated Contact with Patient 08/27/15 1616     Chief Complaint  Patient presents with  . Cellulitis     (Consider location/radiation/quality/duration/timing/severity/associated sxs/prior Treatment) HPI.... Persistent erythema, scaling, excoriation, edema in bilateral lower extremities. This is her fourth medical visit for this problem. She is reported originally diagnosed with "scabies". She was prescribed permethrin cream.  This diagnosis has not been substantiated. She was seen here 2 days ago and prescribed doxycycline. Symptoms have worsened. No fever, sweats, chills. She claims to be reasonably healthy. Severity is moderate.  Past Medical History  Diagnosis Date  . Asthma   . Cellulitis   . Scabies    History reviewed. No pertinent past surgical history. History reviewed. No pertinent family history. Social History  Substance Use Topics  . Smoking status: Current Every Day Smoker -- 0.50 packs/day for 20 years    Types: Cigarettes  . Smokeless tobacco: Never Used  . Alcohol Use: Yes     Comment: occ   OB History    Gravida Para Term Preterm AB TAB SAB Ectopic Multiple Living   0 0 0 0 0 0 0 0 0 0      Review of Systems  All other systems reviewed and are negative.     Allergies  Review of patient's allergies indicates no known allergies.  Home Medications   Prior to Admission medications   Medication Sig Start Date End Date Taking? Authorizing Provider  diphenhydrAMINE (BENADRYL) 25 mg capsule Take 50 mg by mouth every 6 (six) hours as needed for itching.   Yes Historical Provider, MD  doxycycline (VIBRAMYCIN) 100 MG capsule Take 1 capsule (100 mg total) by mouth 2 (two) times daily. For 10 days 08/25/15  Yes Tammy Triplett, PA-C  HYDROcodone-acetaminophen (NORCO/VICODIN) 5-325 MG tablet Take one-two tabs po q 4-6 hrs prn pain 08/25/15  Yes Tammy Triplett, PA-C  famotidine (PEPCID) 20 MG  tablet Take 1 tablet (20 mg total) by mouth daily. 08/15/15   Loren Racer, MD  ibuprofen (ADVIL,MOTRIN) 200 MG tablet Take 800 mg by mouth every 8 (eight) hours as needed for mild pain.     Historical Provider, MD  predniSONE (DELTASONE) 50 MG tablet Take 1 tablet daily 5 days Patient not taking: Reported on 08/24/2015 08/15/15   Loren Racer, MD  pyrethrins-piperonyl butoxide 0.5 % bottle Apply topically once. Patient not taking: Reported on 08/24/2015 08/15/15   Loren Racer, MD   BP 108/78 mmHg  Pulse 135  Temp(Src) 97.6 F (36.4 C) (Oral)  Resp 18  Ht 5\' 3"  (1.6 m)  Wt 139 lb (63.05 kg)  BMI 24.63 kg/m2  SpO2 100%  LMP 08/17/2015 Physical Exam  Constitutional: She is oriented to person, place, and time. She appears well-developed and well-nourished.  HENT:  Head: Normocephalic and atraumatic.  Eyes: Conjunctivae and EOM are normal. Pupils are equal, round, and reactive to light.  Neck: Normal range of motion. Neck supple.  Cardiovascular: Normal rate and regular rhythm.   Pulmonary/Chest: Effort normal and breath sounds normal.  Abdominal: Soft. Bowel sounds are normal.  Musculoskeletal: Normal range of motion.  Neurological: She is alert and oriented to person, place, and time.  Skin:  Bilateral lower extremities: Significant edema, erythema, scaling, blistering, weeping  Psychiatric: She has a normal mood and affect. Her behavior is normal.  Nursing note and vitals reviewed.   ED Course  Procedures (including critical care time) Labs  Review Labs Reviewed  COMPREHENSIVE METABOLIC PANEL - Abnormal; Notable for the following:    Albumin 3.2 (*)    All other components within normal limits  CBC WITH DIFFERENTIAL/PLATELET    Imaging Review No results found. I have personally reviewed and evaluated these images and lab results as part of my medical decision-making.   EKG Interpretation None      MDM   Final diagnoses:  Cellulitis of both lower  extremities    This appears to be cellulitic skin problem. There is also inflammatory component. IV vancomycin. IV steroids. Admit to general medicine. Disc c Dr Ted Mcalpine, MD 08/27/15 Mikle Bosworth  Donnetta Hutching, MD 08/27/15 [phone]

## 2015-08-27 NOTE — Progress Notes (Signed)
ANTIBIOTIC CONSULT NOTE - INITIAL  Pharmacy Consult for Vancomycin Indication: cellulitis  No Known Allergies  Patient Measurements: Height: 5\' 3"  (160 cm) Weight: 139 lb (63.05 kg) IBW/kg (Calculated) : 52.4  Vital Signs: Temp: 97.6 F (36.4 C) (12/04 1543) Temp Source: Oral (12/04 1543) BP: 108/78 mmHg (12/04 1543) Pulse Rate: 135 (12/04 1543) Intake/Output from previous day:   Intake/Output from this shift:    Labs:  Recent Labs  08/24/15 2228 08/27/15 1645  WBC 5.8 6.8  HGB 12.8 13.8  PLT 238 243  CREATININE 0.56 0.67   Estimated Creatinine Clearance: 79.5 mL/min (by C-G formula based on Cr of 0.67). No results for input(s): VANCOTROUGH, VANCOPEAK, VANCORANDOM, GENTTROUGH, GENTPEAK, GENTRANDOM, TOBRATROUGH, TOBRAPEAK, TOBRARND, AMIKACINPEAK, AMIKACINTROU, AMIKACIN in the last 72 hours.   Microbiology: No results found for this or any previous visit (from the past 720 hour(s)).  Medical History: Past Medical History  Diagnosis Date  . Asthma   . Cellulitis   . Scabies    Assessment: 44yo female with previous dx of cellulitis, returns to ED due to no improvement, asked to initiate Vancomycin. Estimated Creatinine Clearance: 79.5 mL/min (by C-G formula based on Cr of 0.67).  Goal of Therapy:  Vancomycin trough level 10-15 mcg/ml  Plan:  Vancomycin 1000mg  IV now x 1 then Vancomycin 750mg  IV q12hrs Check trough at steady state Monitor labs, renal fxn, and c/s  Valrie Hart A 08/27/2015,6:18 PM

## 2015-08-27 NOTE — ED Notes (Signed)
Patient c/o bilateral lower leg pain with wiping. Per patient was here Thursday and diagnosed with cellulitis in which she was given vicodin and doxycycline. Denies any relief or improvement. Patient unsure of fevers but reports occasional chills.Patient also c/o itching and rash to entire body in which she was seen here and diagnosed with scabies. Per patient used prescribed cream with no improvement.

## 2015-08-28 LAB — BASIC METABOLIC PANEL
ANION GAP: 5 (ref 5–15)
BUN: 15 mg/dL (ref 6–20)
CALCIUM: 9 mg/dL (ref 8.9–10.3)
CO2: 24 mmol/L (ref 22–32)
Chloride: 109 mmol/L (ref 101–111)
Creatinine, Ser: 0.45 mg/dL (ref 0.44–1.00)
GLUCOSE: 172 mg/dL — AB (ref 65–99)
Potassium: 4.9 mmol/L (ref 3.5–5.1)
SODIUM: 138 mmol/L (ref 135–145)

## 2015-08-28 LAB — CBC
HCT: 39.5 % (ref 36.0–46.0)
HEMOGLOBIN: 12.7 g/dL (ref 12.0–15.0)
MCH: 29.7 pg (ref 26.0–34.0)
MCHC: 32.2 g/dL (ref 30.0–36.0)
MCV: 92.3 fL (ref 78.0–100.0)
Platelets: 254 10*3/uL (ref 150–400)
RBC: 4.28 MIL/uL (ref 3.87–5.11)
RDW: 12.6 % (ref 11.5–15.5)
WBC: 2.7 10*3/uL — AB (ref 4.0–10.5)

## 2015-08-28 LAB — CRYPTOCOCCAL ANTIGEN: Crypto Ag: NEGATIVE

## 2015-08-28 MED ORDER — ENOXAPARIN SODIUM 40 MG/0.4ML ~~LOC~~ SOLN
40.0000 mg | SUBCUTANEOUS | Status: DC
  Administered 2015-08-28 – 2015-08-30 (×3): 40 mg via SUBCUTANEOUS
  Filled 2015-08-28 (×3): qty 0.4

## 2015-08-28 MED ORDER — HYDROXYZINE HCL 25 MG PO TABS
50.0000 mg | ORAL_TABLET | Freq: Three times a day (TID) | ORAL | Status: DC
  Administered 2015-08-28 – 2015-08-30 (×6): 50 mg via ORAL
  Filled 2015-08-28 (×6): qty 2

## 2015-08-28 MED ORDER — VANCOMYCIN HCL IN DEXTROSE 750-5 MG/150ML-% IV SOLN
INTRAVENOUS | Status: AC
  Filled 2015-08-28: qty 150

## 2015-08-28 MED ORDER — METHYLPREDNISOLONE SODIUM SUCC 40 MG IJ SOLR
40.0000 mg | Freq: Every day | INTRAMUSCULAR | Status: DC
  Administered 2015-08-28 – 2015-08-30 (×3): 40 mg via INTRAVENOUS
  Filled 2015-08-28 (×3): qty 1

## 2015-08-28 NOTE — Progress Notes (Addendum)
[facility] PROGRESS NOTE  Linda Lin JGG:[id number] DOB: Sep 17, 1971 DOA: 08/27/2015 PCP: No PCP Per Patient  Length of stay: 1   Assessment/Plan: Active Problems:   Cellulitis of both lower extremities   Cellulitis   Dermatitis    History of present illness 43 y.o. female who presented to the [facility] ED with complaints of worsening Rash especially on both of her lower legs despite 4 visits to the ED, and Outpatient rx with Pyrethrin for Scabies, and then with Doxycycline for Cellulitis. She denies any Fevers or chills. She denies any contacts with anyone with scabies that she knows of . Her Rash continued to spread all over her body and the rash itches. Her legs developed blisters that burst into ulcers. She was seen in the ED , and placed on IV Vancomycin and given 1 dose of IV Solumedrol and referred for admission. Patient has had 4 ER visits for the same in the last 2 months   Assessment and plan #1 diffuse maculopapular rash with superimposed cellulitis of bilateral lower extremities Continue IV vancomycin, IV Solu-Medrol, Pepcid, Atarax, Patient needs outpatient dermatology evaluation for skin biopsy Patient agreeable to proceed with autoimmune workup including autoimmune antibody panel, HIV, acute hepatitis panel, RPR, syphilis  #2 recent history of scabies, Patient ordered to have  Oral Ivermectin   #3 asthma/controlled at this time  DVT prophylaxsis   Code Status:      Code Status Orders        Start     Ordered   08/27/15 2037  Full code   Continuous     08/27/15 2036     Family Communication: family updated about patient's clinical progress Disposition Plan:  As above       Consultants:  None  Procedures: None  Antibiotics: Anti-infectives    Start     Dose/Rate Route Frequency Ordered Stop   08/28/15 0600  vancomycin (VANCOCIN) IVPB 750 mg/150 ml premix     750 mg 150 mL/hr over 60 Minutes Intravenous Every  12 hours 08/27/15 2036     08/27/15 2030  vancomycin (VANCOCIN) IVPB 1000 mg/200 mL premix     1,000 mg 200 mL/hr over 60 Minutes Intravenous  Once 08/27/15 2021 08/27/15 2130   08/27/15 2030  ivermectin (STROMECTOL) tablet 12,000 mcg     200 mcg/kg  63.1 kg Oral  Once 08/27/15 2032     08/27/15 1830  vancomycin (VANCOCIN) IVPB 1000 mg/200 mL premix     1,000 mg 200 mL/hr over 60 Minutes Intravenous  Once 08/27/15 1817 08/27/15 1930         HPI/Subjective: Complaining of generalized itching  Objective: Filed Vitals:   08/27/15 1939 08/27/15 2040 08/27/15 2121 08/28/15 0454  BP: 129/82 145/96 142/81 153/76  Pulse: 109 101 107 83  Temp:   99.1 F (37.3 C) 98.7 F (37.1 C)  TempSrc:   Oral Oral  Resp: 18 20 20 20   Height:   5\' 3"  (1.6 m)   Weight:   64 kg (141 lb 1.5 oz)   SpO2: 99% 100% 100% 100%    Intake/Output Summary (Last 24 hours) at 08/28/15 1240 Last data filed at 08/28/15 0700  Gross per 24 hour  Intake    480 ml  Output      1 ml  Net    479 ml    Exam: HEART: Regular rate and rhythm; no murmurs rubs or gallops BACK: No kyphosis or scoliosis; No  CVA tenderness ABDOMEN: Positive Bowel Sounds, Scaphoid, Soft Non-Tender, No Rebound or Guarding; No Masses, No Organomegaly Rectal Exam: Not done EXTREMITIES: No Cyanosis, Clubbing, 2+ Edema; Diffuse scattered Ulcerations of the BLEs Genitalia: not examined PULSES: 2+ and symmetric SKIN: Diffuse Papular Pruritic lesions all over and in Webs of fingers, And Diffuse scattered Ulcerations of the BLE CNS: Alert and Oriented x 4, No Focal Deficits Vascular: pulses palpable throughout    Data Review   Micro Results No results found for this or any previous visit (from the past 240 hour(s)).  Radiology Reports No results found.   CBC  Recent Labs Lab 08/24/15 2228 08/27/15 1645 08/28/15 0553  WBC 5.8 6.8 2.7*  HGB 12.8 13.8 12.7  HCT 39.4 41.5 39.5  PLT 238 243 254  MCV 92.1 92.2 92.3  MCH 29.9  30.7 29.7  MCHC 32.5 33.3 32.2  RDW 12.8 12.7 12.6  LYMPHSABS 2.4 2.4  --   MONOABS 1.0 0.7  --   EOSABS 0.3 0.3  --   BASOSABS 0.0 0.0  --     Chemistries   Recent Labs Lab 08/24/15 2228 08/27/15 1645 08/28/15 0553  NA 140 139 138  K 4.1 4.0 4.9  CL 107 106 109  CO2 GLUCOSE 116* 93 172*  BUN CREATININE 0.56 0.67 0.45  CALCIUM 8.8* 9.3 9.0  AST  --  17  --   ALT  --  14  --   ALKPHOS  --  97  --   BILITOT  --  0.4  --    ------------------------------------------------------------------------------------------------------------------ estimated creatinine clearance is 79.9 mL/min (by C-G formula based on Cr of 0.45). ------------------------------------------------------------------------------------------------------------------ No results for input(s): HGBA1C in the last 72 hours. ------------------------------------------------------------------------------------------------------------------ No results for input(s): CHOL, HDL, LDLCALC, TRIG, CHOLHDL, LDLDIRECT in the last 72 hours. ------------------------------------------------------------------------------------------------------------------ No results for input(s): TSH, T4TOTAL, T3FREE, THYROIDAB in the last 72 hours.  Invalid input(s): FREET3 ------------------------------------------------------------------------------------------------------------------ No results for input(s): VITAMINB12, FOLATE, FERRITIN, TIBC, IRON, RETICCTPCT in the last 72 hours.  Coagulation profile No results for input(s): INR, PROTIME in the last 168 hours.  No results for input(s): DDIMER in the last 72 hours.  Cardiac Enzymes No results for input(s): CKMB, TROPONINI, MYOGLOBIN in the last 168 hours.  Invalid input(s): CK ------------------------------------------------------------------------------------------------------------------ Invalid input(s): POCBNP   CBG: No results for input(s): GLUCAP in the  last 168 hours.     Studies: No results found.    No results found for: HGBA1C Lab Results  Component Value Date   CREATININE 0.45 08/28/2015       Scheduled Meds: . enoxaparin (LOVENOX) injection  40 mg Subcutaneous Q24H  . famotidine (PEPCID) IV  20 mg Intravenous Q12H  . hydrOXYzine  50 mg Oral TID  . ivermectin  200 mcg/kg Oral Once  . methylPREDNISolone (SOLU-MEDROL) injection  40 mg Intravenous Daily  . vancomycin  750 mg Intravenous Q12H   Continuous Infusions: . sodium chloride 100 mL/hr at 08/27/15 2138    Active Problems:   Cellulitis of both lower extremities   Cellulitis   Dermatitis    Time spent: 45 minutes   [facility]  [facility] Pager [phone]. If 7PM-7AM, please contact night-coverage at [email or web address], password [email or web address] 08/28/2015, 12:40 PM  LOS: 1 day

## 2015-08-28 NOTE — Care Management Note (Signed)
Case Management Note  Patient Details  Name: Linda Lin MRN: [id number] Date of Birth: [birth year]  Subjective/Objective:                  Pt is from home, lives with her sister and nephew. Pt is ind with ADL's. Pt is employed, uninsured but her place of employment will begin offering insurance in January. Pt goes to the [facility] health dept for PCP care.   Action/Plan: Pt plans to return home at DC. Referral sent to [facility]. Pt may need MATCH voucher. Pt will f/u with [facility] Health Dept and will also be given list of PCP's in area accepting new pt's.   Expected Discharge Date:    08/28/2015              Expected Discharge Plan:  Home/Self Care  In-House Referral:  Financial Counselor  Discharge planning Services  CM Consult  Post Acute Care Choice:  NA Choice offered to:  NA  DME Arranged:    DME Agency:     HH Arranged:    HH Agency:     Status of Service:  In process, will continue to follow  Medicare Important Message Given:    Date Medicare IM Given:    Medicare IM give by:    Date Additional Medicare IM Given:    Additional Medicare Important Message give by:     If discussed at Long Length of Stay Meetings, dates discussed:    Additional Comments:  Malcolm Metro, RN 08/28/2015, 12:07 PM

## 2015-08-29 LAB — HSV(HERPES SIMPLEX VRS) I + II AB-IGG: HSV 2 GLYCOPROTEIN G AB, IGG: 12.9 {index} — AB (ref 0.00–0.90)

## 2015-08-29 LAB — ANTI-DNA ANTIBODY, DOUBLE-STRANDED

## 2015-08-29 LAB — MUMPS ANTIBODY, IGG: Mumps IgG: >300 AU/mL (ref >10.9)

## 2015-08-29 LAB — ANTI-SCLERODERMA ANTIBODY: Scleroderma (Scl-70) (ENA) Antibody, IgG: <0.2 AI (ref 0.0–0.9)

## 2015-08-29 LAB — ROCKY MTN SPOTTED FVR ABS PNL(IGG+IGM)
RMSF IGG: NEGATIVE
RMSF IgM: 0.12 index (ref 0.00–0.89)

## 2015-08-29 LAB — CMV ANTIBODY, IGG (EIA): CMV Ab - IgG: >10 U/mL — ABNORMAL HIGH (ref 0.00–0.59)

## 2015-08-29 LAB — HEPATITIS PANEL, ACUTE
HCV Ab: <0.1 {s_co_ratio} (ref 0.0–0.9)
Hep A IgM: NEGATIVE
Hep B C IgM: NEGATIVE
Hepatitis B Surface Ag: NEGATIVE

## 2015-08-29 LAB — MPO/PR-3 (ANCA) ANTIBODIES
ANCA Proteinase 3: <3.5 U/mL (ref 0.0–3.5)
Myeloperoxidase Abs: <9 U/mL (ref 0.0–9.0)

## 2015-08-29 LAB — VARICELLA ZOSTER ANTIBODY, IGG: Varicella IgG: <135 index — ABNORMAL LOW (ref >165)

## 2015-08-29 LAB — RUBEOLA ANTIBODY IGG: Rubeola IgG: >300 AU/mL (ref >29.9)

## 2015-08-29 LAB — RPR: RPR Ser Ql: NONREACTIVE

## 2015-08-29 LAB — HIV ANTIBODY (ROUTINE TESTING W REFLEX): HIV Screen 4th Generation wRfx: NONREACTIVE

## 2015-08-29 MED ORDER — FAMOTIDINE 20 MG PO TABS
20.0000 mg | ORAL_TABLET | Freq: Two times a day (BID) | ORAL | Status: DC
  Administered 2015-08-29 – 2015-08-31 (×4): 20 mg via ORAL
  Filled 2015-08-29 (×4): qty 1

## 2015-08-29 NOTE — Progress Notes (Signed)
The patient is receiving Pepcid by the intravenous route.  Based on criteria approved by the Pharmacy and Therapeutics Committee and the Medical Executive Committee, the medication is being converted to the equivalent oral dose form.  These criteria include: -No Active GI bleeding -Able to tolerate diet of full liquids (or better) or tube feeding OR able to tolerate other medications by the oral or enteral route  If you have any questions about this conversion, please contact the Pharmacy Department (ext [phone]).  Thank you.  Wayland Denis, [facility] 08/29/2015 11:34 AM

## 2015-08-29 NOTE — Consult Note (Signed)
WOC consulted for LE rash, reviewed chart and it appears rash has spread over her body.  This would be considered outside of the scope of practice for the [facility] nurse and would be most appropriate for dermatology to determine the underlying cause.  I contacted the bedside nurse to determine if the patient has any open wounds that needed topical care and he did not report a need for wound care at this time.   Would recommend follow up with dermatology  Kloie Whiting [facility], CWOCN  [phone]

## 2015-08-29 NOTE — Progress Notes (Signed)
[facility] PROGRESS NOTE  Linda Lin ZHG:[id number] DOB: 12-Nov-1970 DOA: 08/27/2015 PCP: No PCP Per Patient  Length of stay: 2   Assessment/Plan: Active Problems:   Cellulitis of both lower extremities   Cellulitis   Dermatitis    History of present illness 44 y.o. female who presented to the [facility] ED with complaints of worsening Rash especially on both of her lower legs despite 4 visits to the ED, and Outpatient rx with Pyrethrin for Scabies, and then with Doxycycline for Cellulitis. She denies any Fevers or chills. She denies any contacts with anyone with scabies that she knows of . Her Rash continued to spread all over her body and the rash itches. Her legs developed blisters that burst into ulcers. She was seen in the ED , and placed on IV Vancomycin and given 1 dose of IV Solumedrol and referred for admission. Patient has had 4 ER visits for the same in the last 2 months   Assessment and plan #1 diffuse maculopapular rash with superimposed cellulitis of bilateral lower extremities Normal excoriation of the skin of  bilateral lower extremities, as the rash seems to be scabbing and falling off Continue IV vancomycin, IV Solu-Medrol, Pepcid, Atarax, wound care consultation ordered for topical management of her peeling skin Patient needs outpatient dermatology evaluation for skin biopsy Patient agreeable to proceed with autoimmune workup including autoimmune antibody panel, HIV, acute hepatitis panel, RPR, syphilis RPR., Cryptococcus antigen negative  #2 recent history of scabies, Patient ordered to have  Oral Ivermectin   #3 asthma/controlled at this time  DVT prophylaxsis   Code Status:      Code Status Orders        Start     Ordered   08/27/15 2037  Full code   Continuous     08/27/15 2036     Family Communication: family updated about patient's clinical progress Disposition Plan:  Anticipate patient will be discharged  tomorrow,      Consultants:  None  Procedures: None  Antibiotics: Anti-infectives    Start     Dose/Rate Route Frequency Ordered Stop   08/28/15 0600  vancomycin (VANCOCIN) IVPB 750 mg/150 ml premix     750 mg 150 mL/hr over 60 Minutes Intravenous Every 12 hours 08/27/15 2036     08/27/15 2030  vancomycin (VANCOCIN) IVPB 1000 mg/200 mL premix     1,000 mg 200 mL/hr over 60 Minutes Intravenous  Once 08/27/15 2021 08/27/15 2130   08/27/15 2030  ivermectin (STROMECTOL) tablet 12,000 mcg     200 mcg/kg  63.1 kg Oral  Once 08/27/15 2032     08/27/15 1830  vancomycin (VANCOCIN) IVPB 1000 mg/200 mL premix     1,000 mg 200 mL/hr over 60 Minutes Intravenous  Once 08/27/15 1817 08/27/15 1930         HPI/Subjective: Patient complaining of increased itching in bilateral lower extremities with excoriation skin in her lower extremities  Objective: Filed Vitals:   08/28/15 0454 08/28/15 1423 08/28/15 2119 08/29/15 0554  BP: 153/76 140/81 133/74 140/68  Pulse: 83 87 78 102  Temp: 98.7 F (37.1 C) 98.9 F (37.2 C) 98.8 F (37.1 C) 98.2 F (36.8 C)  TempSrc: Oral Oral Oral Oral  Resp: 20 18 18 18   Height:      Weight:      SpO2: 100% 100% 100% 100%    Intake/Output Summary (Last 24 hours) at 08/29/15 1051 Last data filed at 08/29/15 [phone]  Gross per 24 hour  Intake   2246 ml  Output      0 ml  Net   2246 ml    Exam: HEART: Regular rate and rhythm; no murmurs rubs or gallops BACK: No kyphosis or scoliosis; No CVA tenderness ABDOMEN: Positive Bowel Sounds, Scaphoid, Soft Non-Tender, No Rebound or Guarding; No Masses, No Organomegaly Rectal Exam: Not done EXTREMITIES: No Cyanosis, Clubbing, 2+ Edema; Diffuse scattered Ulcerations of the BLEs Genitalia: not examined PULSES: 2+ and symmetric SKIN: Diffuse Papular Pruritic lesions all over and in Webs of fingers, And Diffuse scattered Ulcerations of the BLE CNS: Alert and Oriented x 4, No Focal Deficits Vascular:  pulses palpable throughout    Data Review   Micro Results No results found for this or any previous visit (from the past 240 hour(s)).  Radiology Reports No results found.   CBC  Recent Labs Lab 08/24/15 2228 08/27/15 1645 08/28/15 0553  WBC 5.8 6.8 2.7*  HGB 12.8 13.8 12.7  HCT 39.4 41.5 39.5  PLT 238 243 254  MCV 92.1 92.2 92.3  MCH 29.9 30.7 29.7  MCHC 32.5 33.3 32.2  RDW 12.8 12.7 12.6  LYMPHSABS 2.4 2.4  --   MONOABS 1.0 0.7  --   EOSABS 0.3 0.3  --   BASOSABS 0.0 0.0  --     Chemistries   Recent Labs Lab 08/24/15 2228 08/27/15 1645 08/28/15 0553  NA 140 139 138  K 4.1 4.0 4.9  CL 107 106 109  CO2 GLUCOSE 116* 93 172*  BUN CREATININE 0.56 0.67 0.45  CALCIUM 8.8* 9.3 9.0  AST  --  17  --   ALT  --  14  --   ALKPHOS  --  97  --   BILITOT  --  0.4  --    ------------------------------------------------------------------------------------------------------------------ estimated creatinine clearance is 79.9 mL/min (by C-G formula based on Cr of 0.45). ------------------------------------------------------------------------------------------------------------------ No results for input(s): HGBA1C in the last 72 hours. ------------------------------------------------------------------------------------------------------------------ No results for input(s): CHOL, HDL, LDLCALC, TRIG, CHOLHDL, LDLDIRECT in the last 72 hours. ------------------------------------------------------------------------------------------------------------------ No results for input(s): TSH, T4TOTAL, T3FREE, THYROIDAB in the last 72 hours.  Invalid input(s): FREET3 ------------------------------------------------------------------------------------------------------------------ No results for input(s): VITAMINB12, FOLATE, FERRITIN, TIBC, IRON, RETICCTPCT in the last 72 hours.  Coagulation profile No results for input(s): INR, PROTIME in the last 168  hours.  No results for input(s): DDIMER in the last 72 hours.  Cardiac Enzymes No results for input(s): CKMB, TROPONINI, MYOGLOBIN in the last 168 hours.  Invalid input(s): CK ------------------------------------------------------------------------------------------------------------------ Invalid input(s): POCBNP   CBG: No results for input(s): GLUCAP in the last 168 hours.     Studies: No results found.    No results found for: HGBA1C Lab Results  Component Value Date   CREATININE 0.45 08/28/2015       Scheduled Meds: . enoxaparin (LOVENOX) injection  40 mg Subcutaneous Q24H  . famotidine (PEPCID) IV  20 mg Intravenous Q12H  . hydrOXYzine  50 mg Oral TID  . ivermectin  200 mcg/kg Oral Once  . methylPREDNISolone (SOLU-MEDROL) injection  40 mg Intravenous Daily  . vancomycin  750 mg Intravenous Q12H   Continuous Infusions: . sodium chloride 100 mL/hr at 08/29/15 0915    Active Problems:   Cellulitis of both lower extremities   Cellulitis   Dermatitis    Time spent: 45 minutes   [facility]  [facility] Pager [phone]. If 7PM-7AM, please contact night-coverage at [email or web address], password  [email or web address] 08/29/2015, 10:51 AM  LOS: 2 days

## 2015-08-30 DIAGNOSIS — Z720 Tobacco use: Secondary | ICD-10-CM | POA: Diagnosis present

## 2015-08-30 DIAGNOSIS — L309 Dermatitis, unspecified: Secondary | ICD-10-CM

## 2015-08-30 DIAGNOSIS — L03116 Cellulitis of left lower limb: Secondary | ICD-10-CM

## 2015-08-30 DIAGNOSIS — L03115 Cellulitis of right lower limb: Principal | ICD-10-CM

## 2015-08-30 DIAGNOSIS — R21 Rash and other nonspecific skin eruption: Secondary | ICD-10-CM | POA: Diagnosis present

## 2015-08-30 LAB — COMPREHENSIVE METABOLIC PANEL
ALK PHOS: 85 U/L (ref 38–126)
ALT: 25 U/L (ref 14–54)
AST: 24 U/L (ref 15–41)
Albumin: 2.8 g/dL — ABNORMAL LOW (ref 3.5–5.0)
Anion gap: 4 — ABNORMAL LOW (ref 5–15)
BUN: 13 mg/dL (ref 6–20)
CALCIUM: 8.7 mg/dL — AB (ref 8.9–10.3)
CO2: 25 mmol/L (ref 22–32)
CREATININE: 0.47 mg/dL (ref 0.44–1.00)
Chloride: 108 mmol/L (ref 101–111)
Glucose, Bld: 110 mg/dL — ABNORMAL HIGH (ref 65–99)
Potassium: 3.9 mmol/L (ref 3.5–5.1)
Sodium: 137 mmol/L (ref 135–145)
Total Bilirubin: 0.6 mg/dL (ref 0.3–1.2)
Total Protein: 5.8 g/dL — ABNORMAL LOW (ref 6.5–8.1)

## 2015-08-30 LAB — ADENOVIRUS ANTIBODIES: Adenovirus Antibody: 1:64 {titer} — ABNORMAL HIGH

## 2015-08-30 LAB — CBC
HEMATOCRIT: 33.6 % — AB (ref 36.0–46.0)
HEMOGLOBIN: 11 g/dL — AB (ref 12.0–15.0)
MCH: 29.7 pg (ref 26.0–34.0)
MCHC: 32.7 g/dL (ref 30.0–36.0)
MCV: 90.8 fL (ref 78.0–100.0)
Platelets: 236 10*3/uL (ref 150–400)
RBC: 3.7 MIL/uL — AB (ref 3.87–5.11)
RDW: 12.4 % (ref 11.5–15.5)
WBC: 9.7 10*3/uL (ref 4.0–10.5)

## 2015-08-30 LAB — ANCA TITERS
C-ANCA: <1:20 {titer}
P-ANCA: <1:20 {titer}

## 2015-08-30 MED ORDER — HYDROXYZINE HCL 25 MG PO TABS
25.0000 mg | ORAL_TABLET | Freq: Three times a day (TID) | ORAL | Status: DC
  Administered 2015-08-30 – 2015-08-31 (×3): 25 mg via ORAL
  Filled 2015-08-30 (×3): qty 1

## 2015-08-30 MED ORDER — PREDNISONE 20 MG PO TABS
60.0000 mg | ORAL_TABLET | Freq: Every day | ORAL | Status: DC
  Administered 2015-08-31: 60 mg via ORAL
  Filled 2015-08-30: qty 3

## 2015-08-30 NOTE — Progress Notes (Signed)
[facility] PROGRESS NOTE  TULIP DONLAN YSA:[id number] DOB: [birth year] DOA: 08/27/2015 PCP: No PCP Per Patient    Code Status: Full code Family Communication: Discussed with patient; family not available Disposition Plan: Anticipate discharge in the next 24-48 hours   Consultants:  None  Procedures:  None  Antibiotics:  Vancomycin 08/27/15>>  HPI/Subjective: Patient says that the itching and redness of the rash are less intense. She denies subjective fever or chills.    Objective: Filed Vitals:   08/30/15 0552 08/30/15 0805  BP: 163/83 131/71  Pulse: 84 92  Temp: 98.7 F (37.1 C) 99.7 F (37.6 C)  Resp: 18 18    Intake/Output Summary (Last 24 hours) at 08/30/15 1057 Last data filed at 08/30/15 0830  Gross per 24 hour  Intake    720 ml  Output      0 ml  Net    720 ml   Filed Weights   08/27/15 1543 08/27/15 2121  Weight: 63.05 kg (139 lb) 64 kg (141 lb 1.5 oz)    Exam:   General:  Pleasant alert 44 year old African-recommend woman in no acute distress.  Cardiovascular: S1, S2, no murmurs rubs or cough.  Respiratory: Clear to auscultation bilaterally.  Abdomen: Positive bowel sounds, soft, nontender, nondistended.  Musculoskeletal/extremities: No acute hot joints. Trace-1+  pedal edema bilaterally.  Skin: Diffuse maculopapular pruritic lesions/rash over her neck, torso, arms, abdomen, legs, and webs of fingers. Diffuse scattered superficial ulcerations with skin sloughing on the bilateral lower extremities with intermittent mild erythema. No bruising or draining ulcers. Nontender.   Data Reviewed: Basic Metabolic Panel:  Recent Labs Lab 08/24/15 2228 08/27/15 1645 08/28/15 0553 08/30/15 0523  NA 140 139 138 137  K 4.1 4.0 4.9 3.9  CL 107 106 109 108  CO2 26 25 24 25   GLUCOSE 116* 93 172* 110*  BUN 16 17 15 13   CREATININE 0.56 0.67 0.45 0.47  CALCIUM 8.8* 9.3 9.0 8.7*   Liver Function Tests:  Recent Labs Lab  08/27/15 1645 08/30/15 0523  AST 17 24  ALT 14 25  ALKPHOS 97 85  BILITOT 0.4 0.6  PROT 6.8 5.8*  ALBUMIN 3.2* 2.8*   No results for input(s): LIPASE, AMYLASE in the last 168 hours. No results for input(s): AMMONIA in the last 168 hours. CBC:  Recent Labs Lab 08/24/15 2228 08/27/15 1645 08/28/15 0553 08/30/15 0523  WBC 5.8 6.8 2.7* 9.7  NEUTROABS 2.0 3.4  --   --   HGB 12.8 13.8 12.7 11.0*  HCT 39.4 41.5 39.5 33.6*  MCV 92.1 92.2 92.3 90.8  PLT 238 243 254 236   Cardiac Enzymes: No results for input(s): CKTOTAL, CKMB, CKMBINDEX, TROPONINI in the last 168 hours. BNP (last 3 results) No results for input(s): BNP in the last 8760 hours.  ProBNP (last 3 results) No results for input(s): PROBNP in the last 8760 hours.  CBG: No results for input(s): GLUCAP in the last 168 hours.  No results found for this or any previous visit (from the past 240 hour(s)).   Studies: No results found.  Scheduled Meds: . enoxaparin (LOVENOX) injection  40 mg Subcutaneous Q24H  . famotidine  20 mg Oral BID  . hydrOXYzine  50 mg Oral TID  . ivermectin  200 mcg/kg Oral Once  . methylPREDNISolone (SOLU-MEDROL) injection  40 mg Intravenous Daily  . vancomycin  750 mg Intravenous Q12H   Continuous Infusions: . sodium chloride 10 mL/hr at 08/30/15 0745   Assessment and plan:  Principal Problem:   Maculopapular rash, generalized Active Problems:   Cellulitis of both lower extremities   Dermatitis   Tobacco abuse   1. Diffuse dermatitis and cellulitis with generalized maculopapular rash. The patient presented on 08/27/2015 with worsening diffuse rash. Prior to her admission, she had been seen in the ED for previous times. She was presumed to have scabies. Outpatient treatment included Pyethrin for scabies and then doxycycline for cellulitis. These measures did not help. Her legs began to blister and burst into superficial ulcers. -The patient was started on IV vancomycin and IV  Solu-Medrol. She was given Ivermectin 1 with a possible plan of repeating it in 7 days. She was also started on Atarax and famotidine. -Studies ordered for evaluation revealed a negative autoimmune screen with negative double-stranded DNA, negative scleroderma antibody, nonreactive RPR, negative acute hepatitis panel, negative RMSF IgG and IgM, negative varus sella IgG, nonreactive HIV screen, and negative rubella and mumps IgG. CMV antibody IgG was elevated, but the significance of this is unknown. -There is no inpatient dermatology, but the patient will be referred to outpatient dermatology. -We will decrease hydroxyzine. Pepcid changed to by mouth. IV fluids have already been decreased due to mild pedal edema. -We will change Solu-Medrol to oral prednisone. Continue vancomycin. -Of note, the patient stated that one of her coworkers also developed a rash recently. 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 It is unclear if chemicals in the factory could have contributed to her rash. The exact etiology is not known. -The patient appears to be improving somewhat clinically.  Tobacco abuse. The patient was advised to stop smoking.   Time spent: 35 minutes.    [facility]  [facility] Pager [phone]. If 7PM-7AM, please contact night-coverage at [email or web address], password [email or web address] 08/30/2015, 10:57 AM  LOS: 3 days

## 2015-08-30 NOTE — Progress Notes (Signed)
ANTIBIOTIC CONSULT NOTE - follow up  Pharmacy Consult for Vancomycin Indication: cellulitis  No Known Allergies  Patient Measurements: Height: 5\' 3"  (160 cm) Weight: 141 lb 1.5 oz (64 kg) IBW/kg (Calculated) : 52.4  Vital Signs: Temp: 99.7 F (37.6 C) (12/07 0805) Temp Source: Oral (12/07 0805) BP: 131/71 mmHg (12/07 0805) Pulse Rate: 92 (12/07 0805) Intake/Output from previous day: 12/06 0701 - 12/07 0700 In: 946 [P.O.:720; I.V.:226] Out: -  Intake/Output from this shift: Total I/O In: 240 [P.O.:240] Out: -   Labs:  Recent Labs  08/27/15 1645 08/28/15 0553 08/30/15 0523  WBC 6.8 2.7* 9.7  HGB 13.8 12.7 11.0*  PLT 243 254 236  CREATININE 0.67 0.45 0.47   Estimated Creatinine Clearance: 79.9 mL/min (by C-G formula based on Cr of 0.47). No results for input(s): VANCOTROUGH, VANCOPEAK, VANCORANDOM, GENTTROUGH, GENTPEAK, GENTRANDOM, TOBRATROUGH, TOBRAPEAK, TOBRARND, AMIKACINPEAK, AMIKACINTROU, AMIKACIN in the last 72 hours.   Microbiology: No results found for this or any previous visit (from the past 720 hour(s)).  Medical History: Past Medical History  Diagnosis Date  . Asthma   . Cellulitis   . Scabies    Assessment: 44yo female with previous dx of cellulitis, returns to ED due to no improvement, asked to initiate Vancomycin. Estimated Creatinine Clearance: 79.9 mL/min (by C-G formula based on Cr of 0.47). Patient stable on current tx, Scr = 0.22mcg/ml.  If plan to continue will check trough, currently not warranted and plan to discharge in 24-48 rs.  Goal of Therapy:  Vancomycin trough level 10-15 mcg/ml  Plan:  Continue Vancomycin 750mg  IV q12hrs Check trough at Steady state if warranted Monitor labs, renal fxn, and c/s  Dutch Quint, Zyia Kaneko L 08/30/2015,1:05 PM

## 2015-08-31 MED ORDER — FAMOTIDINE 10 MG PO TABS: 20.0000 mg | ORAL_TABLET | Freq: Every day | ORAL | Status: DC

## 2015-08-31 MED ORDER — HYDROCODONE-ACETAMINOPHEN 5-325 MG PO TABS: ORAL_TABLET | ORAL | Status: DC

## 2015-08-31 MED ORDER — PREDNISONE 10 MG PO TABS
ORAL_TABLET | ORAL | Status: DC
Start: 2015-08-31 — End: 2015-12-19

## 2015-08-31 NOTE — Discharge Summary (Signed)
Physician Discharge Summary  Linda Lin:[id number] DOB: 1971/01/11 DOA: 08/27/2015  PCP: No PCP Per Patient  Admit date: 08/27/2015 Discharge date: 08/31/2015  Time spent: Greater than 30  minutes  Recommendations for Outpatient Follow-up:  1. Patient was referred to dermatology for outpatient follow-up.     Discharge Diagnoses:  1. Diffuse dermatitis with generalized maculopapular rash. 2. Bilateral lower extremity cellulitis. 3. Previous diagnosis of scabies. 4. Tobacco abuse. The patient was advised to stop smoking.   Discharge Condition: Improved.  Diet recommendation: Heart healthy.  Filed Weights   08/27/15 1543 08/27/15 2121  Weight: 63.05 kg (139 lb) 64 kg (141 lb 1.5 oz)    History of present illness:  The patient is a 44 year old woman with with a past medical history significant for asthma, who presented to the emergency department on 08/27/2015 with a chief complaint of worsening rash. She had been seen in the ED for 4 prior visits and at one point, was diagnosed with scabies. She was given a prescription for Pyrethrum for scabies and then doxycycline for cellulitis. Her rash did not improve. Her legs began to blister and rupture into ulcers. She was admitted for further evaluation and management.  Hospital Course:  1. Diffuse dermatitis and cellulitis with generalized maculopapular rash. The patient presented on 08/27/2015 with worsening diffuse rash. Prior to her admission, she had been seen in the ED several previous times. She was presumed to have scabies initially. Outpatient treatment included Pyrethrum for scabies and then doxycycline for cellulitis. These measures did not help. Her legs began to blister and burst into superficial ulcers. -The patient was started on IV vancomycin and IV Solu-Medrol. She was given Ivermectin 1 with a possible plan of repeating it in 7 days. She was also started on Atarax and famotidine for symptomatic relief of  pruritus. -Studies ordered for evaluation revealed a negative autoimmune screen with negative double-stranded DNA, negative scleroderma antibody, nonreactive RPR, negative acute hepatitis panel, negative RMSF IgG and IgM, negative varus sella IgG, nonreactive HIV screen, and negative rubella and mumps IgG. CMV antibody IgG was elevated, but the significance of this was unknown. -There was no inpatient dermatology for consultation. -The patient stated that one of her coworkers also developed a rash recently. 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 It is unclear if the chemicals in the factory could've contributed to her rash or if the rash was some type of contact dermatitis or allergic dermatitis superimposed on possible scabies. -Over the course of the hospitalization, her maculopapular rash began to dry and the erythema and edema of both legs subsided substantially. -She received 4 days of IV antibiotics in addition to outpatient oral antibiotics. At the time of discharge, antibiotics were discontinued if the patient was afebrile and did not have a fever and that the extent of erythema has subsided substantially. However, she was continued on a prednisone taper, Pepcid, and as needed Benadryl. -Appointment was made and scheduled with dermatologist, Dr. Margo Aye. She was instructed to follow-up with him as scheduled.   Tobacco abuse. The patient was advised to stop smoking.  Procedures: None Consultations: None  Discharge Exam: Filed Vitals:   08/31/15 0200 08/31/15 0600  BP: 166/88 156/88  Pulse: 76 71  Temp: 98.8 F (37.1 C) 98.4 F (36.9 C)  Resp: 20 20   oxygen saturation 100% on room air.   General: Pleasant alert 44 year old African-recommend woman in no acute distress.  Cardiovascular: S1, S2, no  murmurs rubs or cough.  Respiratory: Clear to auscultation bilaterally.  Abdomen: Positive bowel sounds, soft, nontender,  nondistended.  Musculoskeletal/extremities: No acute hot joints. Trace pedal edema bilaterally, decreased compared to yesterday.  Skin: Diffuse maculopapular pruritic lesions/rash over her neck, torso, arms, abdomen, legs, and webs of fingers with some superficial drying; Diffuse scattered superficial ulcerations with healing and less skin sloughing; significantly less erythema and edema; nontender.   Discharge Instructions   Discharge Instructions    Diet - low sodium heart healthy    Complete by:  As directed      Discharge instructions    Complete by:  As directed   Try to stop smoking. Keep legs elevated while sitting until you return to work. Take medications as prescribed.     Increase activity slowly    Complete by:  As directed           Current Discharge Medication List    CONTINUE these medications which have CHANGED   Details  famotidine (PEPCID) 10 MG tablet Take 2 tablets (20 mg total) by mouth daily. No prescription is needed.    HYDROcodone-acetaminophen (NORCO/VICODIN) 5-325 MG tablet Take one-two tabs po q 4-6 hrs prn pain Qty: 10 tablet, Refills: 0    predniSONE (DELTASONE) 10 MG tablet Starting tomorrow on 09/01/15: Takes 6 tablets daily for 1 day; then 5 tablets the next day for 1 day; then 4 tablets daily the next day for 1 day; then 3 tablets the next day for 1 day; then 2 tablets the next day for 1 day; then 1 tablet the next day for 1 day; then stop. Qty: 21 tablet, Refills: 0      CONTINUE these medications which have NOT CHANGED   Details  diphenhydrAMINE (BENADRYL) 25 mg capsule Take 50 mg by mouth every 6 (six) hours as needed for itching.      STOP taking these medications     doxycycline (VIBRAMYCIN) 100 MG capsule      ibuprofen (ADVIL,MOTRIN) 200 MG tablet      pyrethrins-piperonyl butoxide 0.5 % bottle        No Known Allergies Follow-up Information    Follow up with [employer] [facility] On 09/14/2015.   Why:  at 9:00 am. Bring ID  and Proof of Income   Contact information:   [address] [place] [address] [phone]       Follow up with Nita Sells On 09/05/2015.   Why:  9am - will have $100 charge - call office if questions about payments   Contact information:   [address] [place], [place] [address] [phone]       The results of significant diagnostics from this hospitalization (including imaging, microbiology, ancillary and laboratory) are listed below for reference.    Significant Diagnostic Studies: No results found.  Microbiology: No results found for this or any previous visit (from the past 240 hour(s)).   Labs: Basic Metabolic Panel:  Recent Labs Lab 08/24/15 2228 08/27/15 1645 08/28/15 0553 08/30/15 0523  NA 140 139 138 137  K 4.1 4.0 4.9 3.9  CL 107 106 109 108  CO2 GLUCOSE 116* 93 172* 110*  BUN CREATININE 0.56 0.67 0.45 0.47  CALCIUM 8.8* 9.3 9.0 8.7*   Liver Function Tests:  Recent Labs Lab 08/27/15 1645 08/30/15 0523  AST 17 24  ALT 14 25  ALKPHOS 97 85  BILITOT 0.4 0.6  PROT 6.8  5.8*  ALBUMIN 3.2* 2.8*   No results for input(s): LIPASE, AMYLASE in the last 168 hours. No results for input(s): AMMONIA in the last 168 hours. CBC:  Recent Labs Lab 08/24/15 2228 08/27/15 1645 08/28/15 0553 08/30/15 0523  WBC 5.8 6.8 2.7* 9.7  NEUTROABS 2.0 3.4  --   --   HGB 12.8 13.8 12.7 11.0*  HCT 39.4 41.5 39.5 33.6*  MCV 92.1 92.2 92.3 90.8  PLT 238 243 254 236   Cardiac Enzymes: No results for input(s): CKTOTAL, CKMB, CKMBINDEX, TROPONINI in the last 168 hours. BNP: BNP (last 3 results) No results for input(s): BNP in the last 8760 hours.  ProBNP (last 3 results) No results for input(s): PROBNP in the last 8760 hours.  CBG: No results for input(s): GLUCAP in the last 168 hours.     Signed:  Luellen Howson  [facility] 08/31/2015, 10:13 AM

## 2015-08-31 NOTE — Care Management Note (Signed)
Case Management Note  Patient Details  Name: BRANDILYNN STOCKBURGER MRN: [id number] Date of Birth: Apr 03, 1971  Pt discharging home today with self care. Pt will have PCP f/u with [facility] health dept. F/u appointment made with Dr. Margo Aye, dermatologist. Pt made aware of $100 OOP cost for pt and possibility of more costs depending on needs. Pt given and explained MATCH voucher. Pt verbalizes understanding.   Expected Discharge Date:                  Expected Discharge Plan:  Home/Self Care  In-House Referral:  Financial Counselor  Discharge planning Services  CM Consult  Post Acute Care Choice:  NA Choice offered to:  NA  DME Arranged:    DME Agency:     HH Arranged:    HH Agency:     Status of Service:  Completed, signed off  Medicare Important Message Given:    Date Medicare IM Given:    Medicare IM give by:    Date Additional Medicare IM Given:    Additional Medicare Important Message give by:     If discussed at Long Length of Stay Meetings, dates discussed:    Additional Comments:  Malcolm Metro, RN 08/31/2015, 11:01 AM

## 2015-08-31 NOTE — Progress Notes (Signed)
Patient's IV removed.  Site WNL.  AVS reviewed with patient.  Verbalized understanding of discharge instructions, physician follow-up, medications.  Work note and prescriptions given to patient.  Patient reports all belongings intact and in possession at time of discharge.  Patient transported by NT via w/c to main entrance for discharge.  Patient stable at time of discharge.

## 2015-12-10 ENCOUNTER — Emergency Department (HOSPITAL_COMMUNITY)
Admission: EM | Admit: 2015-12-10 | Discharge: 2015-12-10 | Disposition: A | Discharge status: Home / Self Care | Payer: Self-pay | Attending: Emergency Medicine | Admitting: Emergency Medicine

## 2015-12-10 ENCOUNTER — Encounter (HOSPITAL_COMMUNITY): Payer: Self-pay | Admitting: *Deleted

## 2015-12-10 DIAGNOSIS — F17210 Nicotine dependence, cigarettes, uncomplicated: Secondary | ICD-10-CM | POA: Insufficient documentation

## 2015-12-10 DIAGNOSIS — J45909 Unspecified asthma, uncomplicated: Secondary | ICD-10-CM | POA: Insufficient documentation

## 2015-12-10 DIAGNOSIS — L03116 Cellulitis of left lower limb: Secondary | ICD-10-CM | POA: Insufficient documentation

## 2015-12-10 DIAGNOSIS — Z79899 Other long term (current) drug therapy: Secondary | ICD-10-CM | POA: Insufficient documentation

## 2015-12-10 DIAGNOSIS — L03119 Cellulitis of unspecified part of limb: Secondary | ICD-10-CM

## 2015-12-10 LAB — CBC WITH DIFFERENTIAL/PLATELET
BASOS PCT: 0 %
Basophils Absolute: 0 10*3/uL (ref 0.0–0.1)
EOS ABS: 0.1 10*3/uL (ref 0.0–0.7)
EOS PCT: 2 %
HEMATOCRIT: 34.8 % — AB (ref 36.0–46.0)
Hemoglobin: 11.4 g/dL — ABNORMAL LOW (ref 12.0–15.0)
Lymphocytes Relative: 50 %
Lymphs Abs: 2.2 10*3/uL (ref 0.7–4.0)
MCH: 30 pg (ref 26.0–34.0)
MCHC: 32.8 g/dL (ref 30.0–36.0)
MCV: 91.6 fL (ref 78.0–100.0)
MONO ABS: 0.6 10*3/uL (ref 0.1–1.0)
MONOS PCT: 14 %
NEUTROS ABS: 1.5 10*3/uL — AB (ref 1.7–7.7)
Neutrophils Relative %: 34 %
Platelets: 223 10*3/uL (ref 150–400)
RBC: 3.8 MIL/uL — ABNORMAL LOW (ref 3.87–5.11)
RDW: 12.2 % (ref 11.5–15.5)
WBC: 4.5 10*3/uL (ref 4.0–10.5)

## 2015-12-10 MED ORDER — CEPHALEXIN 500 MG PO CAPS
500.0000 mg | ORAL_CAPSULE | Freq: Once | ORAL | Status: AC
  Administered 2015-12-10: 500 mg via ORAL
  Filled 2015-12-10: qty 1

## 2015-12-10 MED ORDER — TRAMADOL HCL 50 MG PO TABS: 50.0000 mg | ORAL_TABLET | Freq: Four times a day (QID) | ORAL | Status: DC | PRN

## 2015-12-10 MED ORDER — DEXAMETHASONE 4 MG PO TABS
12.0000 mg | ORAL_TABLET | Freq: Once | ORAL | Status: AC
  Administered 2015-12-10: 12 mg via ORAL
  Filled 2015-12-10: qty 3

## 2015-12-10 MED ORDER — CEPHALEXIN 500 MG PO CAPS: 500.0000 mg | ORAL_CAPSULE | Freq: Four times a day (QID) | ORAL | Status: DC

## 2015-12-10 MED ORDER — OXYCODONE-ACETAMINOPHEN 5-325 MG PO TABS
1.0000 | ORAL_TABLET | Freq: Once | ORAL | Status: AC
  Administered 2015-12-10: 1 via ORAL
  Filled 2015-12-10: qty 1

## 2015-12-10 NOTE — Discharge Instructions (Signed)

## 2015-12-10 NOTE — ED Notes (Signed)
Pt comes in with bilateral leg pain, redness noted. Pt has hx of cellulitis of her legs in December. She states her legs got better but then she started back to work and has now had increase pain in her legs. No fever in triage, pulse 91.

## 2015-12-15 NOTE — ED Provider Notes (Signed)
CSN: [id number]     Arrival date & time 12/10/15  1337 History   First MD Initiated Contact with Patient 12/10/15 1640     Chief Complaint  Patient presents with  . Leg Pain     (Consider location/radiation/quality/duration/timing/severity/associated sxs/prior Treatment) HPI   64y female with bilateral lower shoulder pain and swelling. Past history of cellulitis which is treated with improvement of her symptoms. She began having similar symptoms started about a week ago. Progressively worsening. She denies any trauma but she has been up on her feet more while at work. No orthopnea. No respiratory complaints.  Past Medical History  Diagnosis Date  . Asthma   . Cellulitis   . Scabies    History reviewed. No pertinent past surgical history. No family history on file. Social History  Substance Use Topics  . Smoking status: Current Every Day Smoker -- 0.50 packs/day for 20 years    Types: Cigarettes  . Smokeless tobacco: Never Used  . Alcohol Use: Yes     Comment: occ   OB History    Gravida Para Term Preterm AB TAB SAB Ectopic Multiple Living       Review of Systems  All systems reviewed and negative, other than as noted in HPI.   Allergies  Doxycycline  Home Medications   Prior to Admission medications   Medication Sig Start Date End Date Taking? Authorizing Provider  diphenhydrAMINE (BENADRYL) 25 mg capsule Take 50 mg by mouth every 6 (six) hours as needed for itching.   Yes Historical Provider, MD  cephALEXin (KEFLEX) 500 MG capsule Take 1 capsule (500 mg total) by mouth 4 (four) times daily. 12/10/15   [name], MD  famotidine (PEPCID) 10 MG tablet Take 2 tablets (20 mg total) by mouth daily. No prescription is needed. Patient not taking: Reported on 12/10/2015 08/31/15   Elliot Cousin, MD  HYDROcodone-acetaminophen (NORCO/VICODIN) 5-325 MG tablet Take one-two tabs po q 4-6 hrs prn pain Patient not taking: Reported on 12/10/2015 08/31/15    Elliot Cousin, MD  predniSONE (DELTASONE) 10 MG tablet Starting tomorrow on 09/01/15: Takes 6 tablets daily for 1 day; then 5 tablets the next day for 1 day; then 4 tablets daily the next day for 1 day; then 3 tablets the next day for 1 day; then 2 tablets the next day for 1 day; then 1 tablet the next day for 1 day; then stop. Patient not taking: Reported on 12/10/2015 08/31/15   Elliot Cousin, MD  traMADol (ULTRAM) 50 MG tablet Take 1 tablet (50 mg total) by mouth every 6 (six) hours as needed. 12/10/15   [name], MD   BP 141/91 mmHg  Pulse 108  Temp(Src) 98.8 F (37.1 C) (Oral)  Resp 18  Ht  (1.626 m)  Wt 133 lb (60.328 kg)  BMI 22.82 kg/m2  SpO2 100%  LMP 11/20/2015 Physical Exam  Constitutional: She appears well-developed and well-nourished. No distress.  HENT:  Head: Normocephalic and atraumatic.  Eyes: Conjunctivae are normal. Right eye exhibits no discharge. Left eye exhibits no discharge.  Neck: Neck supple.  Cardiovascular: Normal rate, regular rhythm and normal heart sounds.  Exam reveals no gallop and no friction rub.   No murmur heard. Pulmonary/Chest: Effort normal and breath sounds normal. No respiratory distress.  Abdominal: Soft. She exhibits no distension. There is no tenderness.  Musculoskeletal:  Mild symmetric edema bilateral lower extremities. She has chronic appearing skin changes to  bilateral shins. Darkening of skin with some hypertrophic areas. She does have some small breaks in the skin in her left lower extremity to about the mid anterior shin. There is some mild surrounding redness and increased warmth. She is tender in the same area. Neurovascular intact distally.  Neurological: She is alert.  Skin: Skin is warm and dry.  Psychiatric: She has a normal mood and affect. Her behavior is normal. Thought content normal.  Nursing note and vitals reviewed.   ED Course  Procedures (including critical care time) Labs Review Labs Reviewed  CBC WITH  DIFFERENTIAL/PLATELET - Abnormal; Notable for the following:    RBC 3.80 (*)    Hemoglobin 11.4 (*)    HCT 34.8 (*)    Neutro Abs 1.5 (*)    All other components within normal limits    Imaging Review No results found. I have personally reviewed and evaluated these images and lab results as part of my medical decision-making.   EKG Interpretation None      MDM   Final diagnoses:  Cellulitis of lower extremity, unspecified laterality  45 year old female with chronic appearing skin changes of bilateral lower extremities. It does appear that she may be developing some superimposed cellulitis her left lower extremity. She is afebrile. She is mildly tachycardic but she does not look ill. Will place on antibiotics. Outpatient follow-up.    [name], MD 12/15/15 [phone]

## 2015-12-17 ENCOUNTER — Encounter (HOSPITAL_COMMUNITY): Payer: Self-pay

## 2015-12-17 ENCOUNTER — Emergency Department (HOSPITAL_COMMUNITY)
Admission: EM | Admit: 2015-12-17 | Discharge: 2015-12-17 | Disposition: A | Discharge status: Home / Self Care | Payer: Self-pay | Attending: Emergency Medicine | Admitting: Emergency Medicine

## 2015-12-17 DIAGNOSIS — J45909 Unspecified asthma, uncomplicated: Secondary | ICD-10-CM | POA: Insufficient documentation

## 2015-12-17 DIAGNOSIS — L03115 Cellulitis of right lower limb: Secondary | ICD-10-CM | POA: Insufficient documentation

## 2015-12-17 DIAGNOSIS — L03116 Cellulitis of left lower limb: Secondary | ICD-10-CM | POA: Insufficient documentation

## 2015-12-17 DIAGNOSIS — Z792 Long term (current) use of antibiotics: Secondary | ICD-10-CM | POA: Insufficient documentation

## 2015-12-17 DIAGNOSIS — F17210 Nicotine dependence, cigarettes, uncomplicated: Secondary | ICD-10-CM | POA: Insufficient documentation

## 2015-12-17 DIAGNOSIS — Z8619 Personal history of other infectious and parasitic diseases: Secondary | ICD-10-CM | POA: Insufficient documentation

## 2015-12-17 LAB — COMPREHENSIVE METABOLIC PANEL
ALBUMIN: 3.2 g/dL — AB (ref 3.5–5.0)
ALK PHOS: 120 U/L (ref 38–126)
ALT: 16 U/L (ref 14–54)
AST: 18 U/L (ref 15–41)
Anion gap: 6 (ref 5–15)
BUN: 11 mg/dL (ref 6–20)
CALCIUM: 9.4 mg/dL (ref 8.9–10.3)
CO2: 21 mmol/L — AB (ref 22–32)
CREATININE: 0.73 mg/dL (ref 0.44–1.00)
Chloride: 112 mmol/L — ABNORMAL HIGH (ref 101–111)
GFR calc Af Amer: >60 mL/min (ref >60)
GFR calc non Af Amer: >60 mL/min (ref >60)
GLUCOSE: 92 mg/dL (ref 65–99)
Potassium: 4.8 mmol/L (ref 3.5–5.1)
SODIUM: 139 mmol/L (ref 135–145)
TOTAL PROTEIN: 6.7 g/dL (ref 6.5–8.1)
Total Bilirubin: 0.6 mg/dL (ref 0.3–1.2)

## 2015-12-17 LAB — CBC WITH DIFFERENTIAL/PLATELET
BASOS ABS: 0 10*3/uL (ref 0.0–0.1)
BASOS PCT: 0 %
EOS ABS: 0.3 10*3/uL (ref 0.0–0.7)
EOS PCT: 7 %
HEMATOCRIT: 41.5 % (ref 36.0–46.0)
Hemoglobin: 13.2 g/dL (ref 12.0–15.0)
Lymphocytes Relative: 29 %
Lymphs Abs: 1.3 10*3/uL (ref 0.7–4.0)
MCH: 29.3 pg (ref 26.0–34.0)
MCHC: 31.8 g/dL (ref 30.0–36.0)
MCV: 92.2 fL (ref 78.0–100.0)
MONO ABS: 0.8 10*3/uL (ref 0.1–1.0)
MONOS PCT: 17 %
NEUTROS ABS: 2.1 10*3/uL (ref 1.7–7.7)
Neutrophils Relative %: 47 %
PLATELETS: 253 10*3/uL (ref 150–400)
RBC: 4.5 MIL/uL (ref 3.87–5.11)
RDW: 12.7 % (ref 11.5–15.5)
WBC: 4.5 10*3/uL (ref 4.0–10.5)

## 2015-12-17 MED ORDER — CLINDAMYCIN HCL 150 MG PO CAPS
450.0000 mg | ORAL_CAPSULE | Freq: Once | ORAL | Status: AC
  Administered 2015-12-17: 450 mg via ORAL
  Filled 2015-12-17: qty 3

## 2015-12-17 MED ORDER — CLINDAMYCIN HCL 150 MG PO CAPS: 450.0000 mg | ORAL_CAPSULE | Freq: Four times a day (QID) | ORAL | Status: DC

## 2015-12-17 NOTE — ED Notes (Addendum)
Cellulitis bilateral LE from knees down to feet x 6 months,worse x 1 week.  Cellulitis worsening, leaking fluid, itches and stinging.  Onset 12-12-16 widespread red bumps rash  from antibiotics, itching.

## 2015-12-17 NOTE — ED Notes (Signed)
Pt ambulates independently and with steady gait at time of discharge. Discharge instructions and follow up information reviewed with patient. No other questions or concerns voiced at this time. RX x 1. 

## 2015-12-17 NOTE — Discharge Instructions (Signed)
Please take all of your antibiotics until finished!   You may develop abdominal discomfort or diarrhea from the antibiotic. You may help offset this with probiotics which you can buy or get in yogurt, continue usual home medications.  Rest, drink plenty of fluids, continue using triple antibiotic ointment.  Please follow up with your primary doctor in 3-5 days for discussion of your diagnoses and further evaluation after today's visit; Please return to the ER for new or worsening symptoms, any additional concerns.

## 2015-12-17 NOTE — ED Provider Notes (Signed)
CSN: [id number]     Arrival date & time 12/17/15  1119 History   First MD Initiated Contact with Patient 12/17/15 1252     Chief Complaint  Patient presents with  . Cellulitis     (Consider location/radiation/quality/duration/timing/severity/associated sxs/prior Treatment) The history is provided by the patient and medical records. No language interpreter was used.   Linda Lin is a 45 y.o. female  who presents to the Emergency Department complaining of worsening bilateral lower extremity pain and swelling. She was seen on 3/19 for similar sxs and treated with keflex. She started this ABX on Monday (3/20) and developed a rash on Wednesday (3/22) and discontinued the medication. She was seen by PCP on Friday where she was started on Bactrim. Despite ABX, patient endorses an increase in pain described as burning and worsening of clear fluid drainage from skin. Denies fever/chills, chest pain, palpitations.   Past Medical History  Diagnosis Date  . Asthma   . Cellulitis   . Scabies    History reviewed. No pertinent past surgical history. History reviewed. No pertinent family history. Social History  Substance Use Topics  . Smoking status: Current Every Day Smoker -- 0.25 packs/day for 20 years    Types: Cigarettes  . Smokeless tobacco: Never Used  . Alcohol Use: Yes     Comment: occ   OB History    Gravida Para Term Preterm AB TAB SAB Ectopic Multiple Living   0 0 0 0 0 0 0 0 0 0      Review of Systems  Constitutional: Negative for fever and chills.  HENT: Negative for congestion and sore throat.   Eyes: Negative for visual disturbance.  Respiratory: Negative for cough, shortness of breath and wheezing.   Cardiovascular: Negative.   Gastrointestinal: Negative for nausea, vomiting and abdominal pain.  Genitourinary: Negative for dysuria.  Musculoskeletal: Negative for back pain and neck pain.  Skin: Positive for color change and wound.  Neurological: Negative for  dizziness, weakness and headaches.      Allergies  Cephalexin and Doxycycline  Home Medications   Prior to Admission medications   Medication Sig Start Date End Date Taking? Authorizing Provider  cephALEXin (KEFLEX) 500 MG capsule Take 1 capsule (500 mg total) by mouth 4 (four) times daily. 12/10/15   [name], MD  clindamycin (CLEOCIN) 150 MG capsule Take 3 capsules (450 mg total) by mouth 4 (four) times daily. 12/17/15   Chase Picket Ward, PA-C  diphenhydrAMINE (BENADRYL) 25 mg capsule Take 50 mg by mouth every 6 (six) hours as needed for itching.    Historical Provider, MD  famotidine (PEPCID) 10 MG tablet Take 2 tablets (20 mg total) by mouth daily. No prescription is needed. Patient not taking: Reported on 12/10/2015 08/31/15   Elliot Cousin, MD  HYDROcodone-acetaminophen (NORCO/VICODIN) 5-325 MG tablet Take one-two tabs po q 4-6 hrs prn pain Patient not taking: Reported on 12/10/2015 08/31/15   Elliot Cousin, MD  predniSONE (DELTASONE) 10 MG tablet Starting tomorrow on 09/01/15: Takes 6 tablets daily for 1 day; then 5 tablets the next day for 1 day; then 4 tablets daily the next day for 1 day; then 3 tablets the next day for 1 day; then 2 tablets the next day for 1 day; then 1 tablet the next day for 1 day; then stop. Patient not taking: Reported on 12/10/2015 08/31/15   Elliot Cousin, MD  traMADol (ULTRAM) 50 MG tablet Take 1 tablet (50 mg total) by mouth every 6 (six) hours  as needed. 12/10/15   [name], MD   BP 111/98 mmHg  Pulse 103  Temp(Src) 98.1 F (36.7 C) (Oral)  Resp 18  Ht  (1.6 m)  Wt 60.374 kg  BMI 23.58 kg/m2  SpO2 100%  LMP 11/20/2015 Physical Exam  Constitutional: She is oriented to person, place, and time. She appears well-developed and well-nourished.  Alert and in no acute distress  HENT:  Head: Normocephalic and atraumatic.  Cardiovascular: Normal rate, regular rhythm and normal heart sounds.  Exam reveals no gallop and no friction rub.   No  murmur heard. Bilateral lower extremities with equal and intact distal pulses.  Pulmonary/Chest: Effort normal and breath sounds normal. No respiratory distress. She has no wheezes. She has no rales. She exhibits no tenderness.  Abdominal: Soft. Bowel sounds are normal. She exhibits no distension and no mass. There is no tenderness. There is no rebound and no guarding.  Musculoskeletal: She exhibits no edema.  Neurological: She is alert and oriented to person, place, and time.  Skin: Skin is warm.  Bilateral lower extremities with chronic skin changes and hyperpigmentation.  + swelling, mild surrounding erythema and slightly warm to the touch. Areas of breaks in the skin with clear drainage. Tender to the touch. Sensation intact.   Psychiatric: She has a normal mood and affect. Her behavior is normal. Judgment and thought content normal.  Nursing note and vitals reviewed.   ED Course  Procedures (including critical care time) Labs Review Labs Reviewed  COMPREHENSIVE METABOLIC PANEL - Abnormal; Notable for the following:    Chloride 112 (*)    CO2 21 (*)    Albumin 3.2 (*)    All other components within normal limits  CBC WITH DIFFERENTIAL/PLATELET    Imaging Review No results found. I have personally reviewed and evaluated these images and lab results as part of my medical decision-making.   EKG Interpretation None      MDM   Final diagnoses:  Cellulitis of right lower extremity  Cellulitis of left lower extremity   Linda Lin presents with worsening bilateral edema, erythema, pain. Seen on 3/24 where she was given keflex. Took 3 doses then developed allergy to meds and discontinued it. PCP switched ABX to Bactrim and she has had 3 doses of this as well. She has not successfully completed full course of antibiotics as an outpatient at this time. On exam, patient has chronic inflammatory skin changes, but there are areas of erythema that appear cellulitic in nature as  well. CBC wdl; BMP reassuring. Patient is afebrile and VSS. 1st dose of clindamycin given in ED. Patient was given strict return precautions and follow up instructions - she appears reasonable to follow up. All questions answered.   Patient seen by and discussed with Dr. Criss Alvine who agrees with treatment plan.    [facility] Ward, PA-C 12/17/15 1510  Pricilla Loveless, MD 12/21/15 [phone]

## 2015-12-19 ENCOUNTER — Encounter (HOSPITAL_COMMUNITY): Payer: Self-pay | Admitting: Emergency Medicine

## 2015-12-19 ENCOUNTER — Inpatient Hospital Stay (HOSPITAL_COMMUNITY)
Admission: EM | Admit: 2015-12-19 | Discharge: 2015-12-21 | DRG: 607 | Disposition: A | Discharge status: Home / Self Care | Payer: Self-pay | Attending: Internal Medicine | Admitting: Internal Medicine

## 2015-12-19 DIAGNOSIS — L03119 Cellulitis of unspecified part of limb: Secondary | ICD-10-CM

## 2015-12-19 DIAGNOSIS — Z8709 Personal history of other diseases of the respiratory system: Secondary | ICD-10-CM

## 2015-12-19 DIAGNOSIS — L258 Unspecified contact dermatitis due to other agents: Principal | ICD-10-CM | POA: Diagnosis present

## 2015-12-19 DIAGNOSIS — J45909 Unspecified asthma, uncomplicated: Secondary | ICD-10-CM | POA: Diagnosis present

## 2015-12-19 DIAGNOSIS — L03116 Cellulitis of left lower limb: Secondary | ICD-10-CM

## 2015-12-19 DIAGNOSIS — L259 Unspecified contact dermatitis, unspecified cause: Secondary | ICD-10-CM

## 2015-12-19 DIAGNOSIS — L0390 Cellulitis, unspecified: Secondary | ICD-10-CM | POA: Insufficient documentation

## 2015-12-19 DIAGNOSIS — R600 Localized edema: Secondary | ICD-10-CM

## 2015-12-19 DIAGNOSIS — F17210 Nicotine dependence, cigarettes, uncomplicated: Secondary | ICD-10-CM | POA: Diagnosis present

## 2015-12-19 DIAGNOSIS — L03115 Cellulitis of right lower limb: Secondary | ICD-10-CM | POA: Insufficient documentation

## 2015-12-19 LAB — CBC WITH DIFFERENTIAL/PLATELET
Basophils Absolute: 0 10*3/uL (ref 0.0–0.1)
Basophils Relative: 0 %
EOS PCT: 5 %
Eosinophils Absolute: 0.3 10*3/uL (ref 0.0–0.7)
HEMATOCRIT: 42.9 % (ref 36.0–46.0)
Hemoglobin: 14.1 g/dL (ref 12.0–15.0)
LYMPHS ABS: 1.6 10*3/uL (ref 0.7–4.0)
LYMPHS PCT: 25 %
MCH: 29.7 pg (ref 26.0–34.0)
MCHC: 32.9 g/dL (ref 30.0–36.0)
MCV: 90.5 fL (ref 78.0–100.0)
MONO ABS: 0.6 10*3/uL (ref 0.1–1.0)
Monocytes Relative: 9 %
NEUTROS ABS: 3.7 10*3/uL (ref 1.7–7.7)
NEUTROS PCT: 61 %
PLATELETS: 281 10*3/uL (ref 150–400)
RBC: 4.74 MIL/uL (ref 3.87–5.11)
RDW: 12.5 % (ref 11.5–15.5)
WBC: 6.2 10*3/uL (ref 4.0–10.5)

## 2015-12-19 LAB — COMPREHENSIVE METABOLIC PANEL
ALT: 17 U/L (ref 14–54)
AST: 20 U/L (ref 15–41)
Albumin: 3.3 g/dL — ABNORMAL LOW (ref 3.5–5.0)
Alkaline Phosphatase: 145 U/L — ABNORMAL HIGH (ref 38–126)
Anion gap: 6 (ref 5–15)
BILIRUBIN TOTAL: 0.3 mg/dL (ref 0.3–1.2)
BUN: 12 mg/dL (ref 6–20)
CHLORIDE: 109 mmol/L (ref 101–111)
CO2: 21 mmol/L — ABNORMAL LOW (ref 22–32)
CREATININE: 0.49 mg/dL (ref 0.44–1.00)
Calcium: 8.7 mg/dL — ABNORMAL LOW (ref 8.9–10.3)
Glucose, Bld: 122 mg/dL — ABNORMAL HIGH (ref 65–99)
Potassium: 4 mmol/L (ref 3.5–5.1)
Sodium: 136 mmol/L (ref 135–145)
TOTAL PROTEIN: 7.1 g/dL (ref 6.5–8.1)

## 2015-12-19 LAB — I-STAT CG4 LACTIC ACID, ED: LACTIC ACID, VENOUS: 1.78 mmol/L (ref 0.5–2.0)

## 2015-12-19 MED ORDER — ENOXAPARIN SODIUM 40 MG/0.4ML ~~LOC~~ SOLN
40.0000 mg | SUBCUTANEOUS | Status: DC
  Administered 2015-12-20: 40 mg via SUBCUTANEOUS
  Filled 2015-12-19: qty 0.4

## 2015-12-19 MED ORDER — ONDANSETRON HCL 4 MG/2ML IJ SOLN: 4.0000 mg | Freq: Four times a day (QID) | INTRAMUSCULAR | Status: DC | PRN

## 2015-12-19 MED ORDER — ACETAMINOPHEN 650 MG RE SUPP
650.0000 mg | Freq: Four times a day (QID) | RECTAL | Status: DC | PRN
Start: 2015-12-19 — End: 2015-12-21

## 2015-12-19 MED ORDER — VANCOMYCIN HCL IN DEXTROSE 1-5 GM/200ML-% IV SOLN
1000.0000 mg | Freq: Once | INTRAVENOUS | Status: AC
  Administered 2015-12-19: 1000 mg via INTRAVENOUS
  Filled 2015-12-19: qty 200

## 2015-12-19 MED ORDER — ONDANSETRON HCL 4 MG PO TABS: 4.0000 mg | ORAL_TABLET | Freq: Four times a day (QID) | ORAL | Status: DC | PRN

## 2015-12-19 MED ORDER — VANCOMYCIN HCL IN DEXTROSE 750-5 MG/150ML-% IV SOLN
750.0000 mg | Freq: Two times a day (BID) | INTRAVENOUS | Status: DC
  Administered 2015-12-20 – 2015-12-21 (×3): 750 mg via INTRAVENOUS
  Filled 2015-12-19 (×5): qty 150

## 2015-12-19 MED ORDER — SODIUM CHLORIDE 0.9 % IV BOLUS (SEPSIS)
1000.0000 mL | Freq: Once | INTRAVENOUS | Status: AC
  Administered 2015-12-19: 1000 mL via INTRAVENOUS

## 2015-12-19 MED ORDER — SODIUM CHLORIDE 0.9 % IV SOLN: INTRAVENOUS | Status: DC

## 2015-12-19 MED ORDER — SODIUM CHLORIDE 0.9 % IV SOLN
INTRAVENOUS | Status: DC
  Administered 2015-12-19: 20:00:00 via INTRAVENOUS

## 2015-12-19 MED ORDER — POLYETHYLENE GLYCOL 3350 17 G PO PACK: 17.0000 g | PACK | Freq: Every day | ORAL | Status: DC | PRN

## 2015-12-19 MED ORDER — HYDROCODONE-ACETAMINOPHEN 5-325 MG PO TABS
1.0000 | ORAL_TABLET | ORAL | Status: DC | PRN
  Administered 2015-12-19: 1 via ORAL
  Filled 2015-12-19: qty 1

## 2015-12-19 MED ORDER — ACETAMINOPHEN 325 MG PO TABS: 650.0000 mg | ORAL_TABLET | Freq: Four times a day (QID) | ORAL | Status: DC | PRN

## 2015-12-19 MED ORDER — DIPHENHYDRAMINE HCL 25 MG PO CAPS
25.0000 mg | ORAL_CAPSULE | Freq: Four times a day (QID) | ORAL | Status: DC | PRN
  Administered 2015-12-19 – 2015-12-21 (×4): 25 mg via ORAL
  Filled 2015-12-19 (×4): qty 1

## 2015-12-19 NOTE — Progress Notes (Signed)
Pharmacy Antibiotic Note  Linda Lin is a 45 y.o. female admitted on 12/19/2015 with cellulitis.  Pharmacy has been consulted for Vancomycin dosing.  Plan: Initial dose given in ED (1 gm) Vancomycin 750 IV every 12 hours.  Goal trough 10-15 mcg/mL.  Monitor labs, micro and vitals.  Height: 5\' 3"  (160 cm) Weight: 133 lb (60.328 kg) IBW/kg (Calculated) : 52.4  Temp (24hrs), Avg:98.1 F (36.7 C), Min:97.7 F (36.5 C), Max:98.5 F (36.9 C)   Recent Labs Lab 12/17/15 1341 12/19/15 1510 12/19/15 1521  WBC 4.5 6.2  --   CREATININE 0.73 0.49  --   LATICACIDVEN  --   --  1.78    Estimated Creatinine Clearance: 72.7 mL/min (by C-G formula based on Cr of 0.49).    Allergies  Allergen Reactions  . Cephalexin Rash  . Doxycycline Rash    Antimicrobials this admission: Vanc 3/28 >>   Dose adjustments this admission: n/a  Microbiology results: 3/28 BCx: pending  Thank you for allowing pharmacy to be a part of this patient's care.  Mady Gemma 12/19/2015 6:48 PM

## 2015-12-19 NOTE — ED Notes (Addendum)
C/o cellulitis to both legs and has notice more drainage to both legs.  Was seen at [facility] on Sunday for cellulitis, treated with antibiotic.  Pt was placed on antibiotic (Cephalexine) and has rash to entire body, which antibiotics were stopped on march 19 th and Pt was placed on clindamycin On 12-17-15 for cellulitis.

## 2015-12-19 NOTE — ED Provider Notes (Signed)
CSN: [id number]     Arrival date & time 12/19/15  1142 History   First MD Initiated Contact with Patient 12/19/15 1501     Chief Complaint  Patient presents with  . Cellulitis     (Consider location/radiation/quality/duration/timing/severity/associated sxs/prior Treatment) HPI Comments:  Patient presents with worsening bilateral lower extremity cellulitis since March 19. She was treated with a course of Keflex but developed a diffuse body rash and this medicine was discontinued in March 22. She  was seen in the ED on March 26 and switch to clindamycin and she has had 3 days worth. She developed a fever 3 days ago to 100.1. States her pain and swelling and redness are getting worse with worsening clear drainage from the skin. Denies any chills, chest pain. She does endorse some shortness of breath she is tachycardic on arrival but afebrile. She denies being diabetic. She is a smoker. Denies any direct trauma to her lower extremities. No chest pain. No vomiting or diarrhea.   The history is provided by the patient.    Past Medical History  Diagnosis Date  . Asthma   . Cellulitis   . Scabies    History reviewed. No pertinent past surgical history. History reviewed. No pertinent family history. Social History  Substance Use Topics  . Smoking status: Current Every Day Smoker -- 0.25 packs/day for 20 years    Types: Cigarettes  . Smokeless tobacco: Never Used  . Alcohol Use: Yes     Comment: occ   OB History    Gravida Para Term Preterm AB TAB SAB Ectopic Multiple Living       Review of Systems  Constitutional: Positive for fever, activity change and appetite change.  HENT: Negative for congestion.   Respiratory: Negative for cough, chest tightness and shortness of breath.   Cardiovascular: Positive for leg swelling. Negative for chest pain.  Gastrointestinal: Negative for nausea, vomiting and abdominal pain.  Genitourinary: Negative for dysuria, hematuria,  vaginal bleeding and vaginal discharge.  Musculoskeletal: Negative for myalgias and arthralgias.  Skin: Negative for rash.  Neurological: Negative for dizziness and numbness.  A complete 10 system review of systems was obtained and all systems are negative except as noted in the HPI and PMH.      Allergies  Cephalexin and Doxycycline  Home Medications   Prior to Admission medications   Medication Sig Start Date End Date Taking? Authorizing Provider  clindamycin (CLEOCIN) 150 MG capsule Take 3 capsules (450 mg total) by mouth 4 (four) times daily. 12/17/15  Yes Jaime Pilcher Ward, PA-C  diphenhydrAMINE (BENADRYL) 25 mg capsule Take 50 mg by mouth every 6 (six) hours as needed for itching.   Yes Historical Provider, MD  ibuprofen (ADVIL,MOTRIN) 200 MG tablet Take 200 mg by mouth every 6 (six) hours as needed for mild pain or moderate pain.   Yes Historical Provider, MD  cephALEXin (KEFLEX) 500 MG capsule Take 1 capsule (500 mg total) by mouth 4 (four) times daily. Patient not taking: Reported on 12/19/2015 12/10/15   [name], MD  traMADol (ULTRAM) 50 MG tablet Take 1 tablet (50 mg total) by mouth every 6 (six) hours as needed. Patient not taking: Reported on 12/19/2015 12/10/15   [name], MD   BP 119/87 mmHg  Pulse 100  Temp(Src) 98.1 F (36.7 C) (Oral)  Resp 20  Ht  (1.6 m)  Wt 133 lb (60.328 kg)  BMI 23.57 kg/m2  SpO2 100%  LMP 12/19/2015 Physical Exam  Constitutional: She is oriented to person, place, and time. She appears well-developed and well-nourished. No distress.  HENT:  Head: Normocephalic and atraumatic.  Mouth/Throat: Oropharynx is clear and moist. No oropharyngeal exudate.  Eyes: Conjunctivae and EOM are normal. Pupils are equal, round, and reactive to light.  Neck: Normal range of motion. Neck supple.  No meningismus.  Cardiovascular: Normal rate, normal heart sounds and intact distal pulses.   No murmur heard. tachycardic  Pulmonary/Chest: Effort  normal and breath sounds normal. No respiratory distress.  Abdominal: Soft. There is no tenderness. There is no rebound and no guarding.  Musculoskeletal: Normal range of motion. She exhibits edema and tenderness.  Bilateral lower extremities with chronic skin changes and hyperpigmentation, there is diffuse swelling and erythema exiting from knees to bilateral dorsal feet. Intact DP pulses. Breaks in the skin with clear drainage. Diffuse tenderness. Compartments soft.  Neurological: She is alert and oriented to person, place, and time. No cranial nerve deficit. She exhibits normal muscle tone. Coordination normal.  No ataxia on finger to nose bilaterally. No pronator drift. 5/5 strength throughout. CN 2-12 intact.Equal grip strength. Sensation intact.   Skin: Skin is warm.  Psychiatric: She has a normal mood and affect. Her behavior is normal.  Nursing note and vitals reviewed.   ED Course  Procedures (including critical care time) Labs Review Labs Reviewed  COMPREHENSIVE METABOLIC PANEL - Abnormal; Notable for the following:    CO2 21 (*)    Glucose, Bld 122 (*)    Calcium 8.7 (*)    Albumin 3.3 (*)    Alkaline Phosphatase 145 (*)    All other components within normal limits  CULTURE, BLOOD (ROUTINE X 2)  CULTURE, BLOOD (ROUTINE X 2)  CBC WITH DIFFERENTIAL/PLATELET  I-STAT CG4 LACTIC ACID, ED  I-STAT CG4 LACTIC ACID, ED    Imaging Review No results found. I have personally reviewed and evaluated these images and lab results as part of my medical decision-making.   EKG Interpretation   Date/Time:  Tuesday December 19 2015 11:57:09 EDT Ventricular Rate:  0 PR Interval:  130 QRS Duration: 0 QT Interval:  0 QTC Calculation: 0 R Axis:   0 Text Interpretation:  No QRS complexes found, no ECG analysis possible  artifact, needs repeat Confirmed by Bebe Shaggy  MD, Dorinda Hill ([id number]) on  12/19/2015 12:00:08 PM         MDM   Final diagnoses:  Cellulitis of lower extremity,  unspecified laterality  Patient presents with worsening lower extremity cellulitis after failing outpatient antibiotics. Redness and swelling are getting worse. She is tachycardic but afebrile.  Sinus tachycardia to 138 but no fever.  Heart rate improved to 100 after IV fluids. Cultures sent. Normal lactate normal white blood cell count. Patient started on IV vancomycin.  Patient will be admitted for failing outpatient treatment of cellulitis. Similar admission in December. Discussed with Dr. Arlean Hopping.  Glynn Octave, MD 12/19/15 [phone]

## 2015-12-19 NOTE — H&P (Signed)
[facility] History and Physical  DAVELYN GWINN QMV:[id number] DOB: [birth year] DOA: 12/19/2015  Referring physician: Dr Manus Gunning PCP: No PCP Per Patient   Chief Complaint: Recurrent cellulitis LE's  HPI: Linda Lin is a 45 y.o. female with hx mild asthma, scabies, remote genital herpes , who is presenting to ER with pain, redness and swelling of bilat lower legs.  The swelling is always there to some degree but got worse over the last 24 hrs or so with onset of pain and redness and weepeing.  She was admitted here Dec 2016 with similar presentation and was treated with IV vanc, IV solumedrol, IVermectin x 1 and to repeat in 7 days.  Atarax given for itching.  Autoimmune w/u was done as follows > Negative dsDNA, neg scleroderma, neg RPR, neg acute hepatitis panel, neg RMSF panel, neg varicella IgG, neg HIV, neg rubella/ mumps IgG. CMV IgG was ^ but unknown clinical significance.  She was treated for 7 days in the hospital w significant improvement and was dc'd to receive total of 14 days of abx, however abx were dc'd at dc as the legs were remarkably better.  Appt with made for OP visit w a dermatologist.    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  She does not come into contact with fumes or the chemicals in her dept.  The chemical rooms require specific training.  The wipes are already soaked with the chemicals before they get into her dept for packaging in boxes.  She does note that she has had some leg swelling for quite some time, not this bad, but that predated her first leg infection last December. Denies hx of etoh abuse, liver or kidney or heart disease.  She does smoke, no drug use.  Is single.    ROS  denies CP  no joint pain   no HA  no blurry vision  no rash  no diarrhea  no nausea/ vomiting  no dysuria  no difficulty voiding  no change in urine color    Where does patient live home alone Can  patient participate in ADLs? yes  Past Medical History  Past Medical History  Diagnosis Date  . Asthma   . Cellulitis   . Scabies    Past Surgical History History reviewed. No pertinent past surgical history. Family History History reviewed. No pertinent family history. Social History  reports that she has been smoking Cigarettes.  She has a 5 pack-year smoking history. She has never used smokeless tobacco. She reports that she drinks alcohol. She reports that she does not use illicit drugs. Allergies  Allergies  Allergen Reactions  . Cephalexin Rash  . Doxycycline Rash   Home medications Prior to Admission medications   Medication Sig Start Date End Date Taking? Authorizing Provider  clindamycin (CLEOCIN) 150 MG capsule Take 3 capsules (450 mg total) by mouth 4 (four) times daily. 12/17/15  Yes Jaime Pilcher Ward, PA-C  diphenhydrAMINE (BENADRYL) 25 mg capsule Take 50 mg by mouth every 6 (six) hours as needed for itching.   Yes Historical Provider, MD  ibuprofen (ADVIL,MOTRIN) 200 MG tablet Take 200 mg by mouth every 6 (six) hours as needed for mild pain or moderate pain.   Yes Historical Provider, MD  cephALEXin (KEFLEX) 500 MG capsule Take 1 capsule (500 mg total) by mouth 4 (four) times daily. Patient not taking: Reported on 12/19/2015 12/10/15   [name], MD  traMADol (ULTRAM) 50 MG tablet Take 1 tablet (50 mg total) by mouth every 6 (six) hours as needed. Patient not taking: Reported on 12/19/2015 12/10/15   [name], MD   Liver Function Tests  Recent Labs Lab 12/17/15 1341 12/19/15 1510  AST 18 20  ALT 16 17  ALKPHOS 120 145*  BILITOT 0.6 0.3  PROT 6.7 7.1  ALBUMIN 3.2* 3.3*   No results for input(s): LIPASE, AMYLASE in the last 168 hours. CBC  Recent Labs Lab 12/17/15 1341 12/19/15 1510  WBC 4.5 6.2  NEUTROABS 2.1 3.7  HGB 13.2 14.1  HCT 41.5 42.9  MCV 92.2 90.5  PLT 253 281   Basic Metabolic Panel  Recent Labs Lab 12/17/15 1341 12/19/15 1510   NA 139 136  K 4.8 4.0  CL 112* 109  CO2 21* 21*  GLUCOSE 92 122*  BUN 11 12  CREATININE 0.73 0.49  CALCIUM 9.4 8.7*     Filed Vitals:   12/19/15 1630 12/19/15 1716 12/19/15 1717 12/19/15 1718  BP: 134/86 119/87    Pulse: 100   100  Temp:      TempSrc:      Resp: 26  16 20   Height:      Weight:      SpO2: 100%   100%   Exam: VS: 134/86   HR 100  RR 26   Temp 98.1   100% RA  Gen alert, no distress, small -framed AAF no distress No rash, cyanosis or gangrene Sclera anicteric, throat clear  No jvd or bruits Chest clear bilat RRR no MRG, quiet precord Abd soft ntnd no mass or ascites +bs liver down 3cm GU defer MS no joint effusions or deformity Ext 1-2+ bilat edema, some chronic skin changes and also bilat erythema mid calf to ankles bilat w weeping Neuro is alert, Ox 3 , nf   EKG (independently reviewed) > EKG , sinus tach 113, LVH borderline by voltage vs normal variant  Home medications: clindamycin po, denadryl, ibuprofen, cephalexin, tramadol  Na 136  K 4.0  BUN 12  Cr 0.49  Ca 8.7  Glu 122   Alb 3.3   LFT"s ok WBC 6k  Hb 14 plt 281   Assessment: 1. Cellulitis bilat LE's - recurrent problem, had similar presentation in Dec 2016 treated w Vanc/ steroid/ Ivermectin.  Not sure why this is happening again.  She does report some chronic leg edema which she attributes to standing at work all day in the [employer].  Clinically doesn't appear to have renal / liver or heart failure, will get ECHO to eval for pulm HTN or some subclinical valve disease / CM.  Get UA r/o neph syndrome, unlikely.  May benefit from VVS and/ or ID referral after resolved if no other explanation arises.  Plan admit, IV vanc, IVF's and echo in am.  UA.  LE dopplers.  2. Tobacco use 3. Remote genital HSV - hasn't flared in many yrs per pt  Plan - as above   DVT Prophylaxis lovenox  Code Status: full  Family Communication: none here  Disposition Plan: home when better    Maree Krabbe [facility] Pager [phone]  Cell [phone]  If 7PM-7AM, please contact night-coverage [email or web address] Password [email or web address] 12/19/2015, 5:20 PM

## 2015-12-19 NOTE — ED Notes (Signed)
Pt ambulatory to bathroom without difficulty.  

## 2015-12-20 ENCOUNTER — Inpatient Hospital Stay (HOSPITAL_COMMUNITY): Payer: Self-pay

## 2015-12-20 DIAGNOSIS — L03115 Cellulitis of right lower limb: Secondary | ICD-10-CM

## 2015-12-20 DIAGNOSIS — R609 Edema, unspecified: Secondary | ICD-10-CM

## 2015-12-20 DIAGNOSIS — L03116 Cellulitis of left lower limb: Secondary | ICD-10-CM

## 2015-12-20 DIAGNOSIS — Z8709 Personal history of other diseases of the respiratory system: Secondary | ICD-10-CM

## 2015-12-20 LAB — BASIC METABOLIC PANEL WITH GFR
Anion gap: 5 (ref 5–15)
BUN: 11 mg/dL (ref 6–20)
CO2: 23 mmol/L (ref 22–32)
Calcium: 8.3 mg/dL — ABNORMAL LOW (ref 8.9–10.3)
Chloride: 110 mmol/L (ref 101–111)
Creatinine, Ser: 0.46 mg/dL (ref 0.44–1.00)
GFR calc Af Amer: >60 mL/min
GFR calc non Af Amer: >60 mL/min
Glucose, Bld: 116 mg/dL — ABNORMAL HIGH (ref 65–99)
Potassium: 4.5 mmol/L (ref 3.5–5.1)
Sodium: 138 mmol/L (ref 135–145)

## 2015-12-20 LAB — CBC
HCT: 36.6 % (ref 36.0–46.0)
HEMOGLOBIN: 11.9 g/dL — AB (ref 12.0–15.0)
MCH: 29.6 pg (ref 26.0–34.0)
MCHC: 32.5 g/dL (ref 30.0–36.0)
MCV: 91 fL (ref 78.0–100.0)
PLATELETS: 267 10*3/uL (ref 150–400)
RBC: 4.02 MIL/uL (ref 3.87–5.11)
RDW: 12.6 % (ref 11.5–15.5)
WBC: 5.7 10*3/uL (ref 4.0–10.5)

## 2015-12-20 LAB — URINALYSIS, ROUTINE W REFLEX MICROSCOPIC
Bilirubin Urine: NEGATIVE
Glucose, UA: NEGATIVE mg/dL
Ketones, ur: NEGATIVE mg/dL
Nitrite: NEGATIVE
Protein, ur: NEGATIVE mg/dL
Specific Gravity, Urine: 1.03 (ref 1.005–1.030)
pH: 5 (ref 5.0–8.0)

## 2015-12-20 LAB — ECHOCARDIOGRAM COMPLETE
Height: 63 in
Weight: 2128 [oz_av]

## 2015-12-20 LAB — C-REACTIVE PROTEIN: CRP: 0.7 mg/dL (ref <1.0)

## 2015-12-20 LAB — URINE MICROSCOPIC-ADD ON

## 2015-12-20 LAB — SEDIMENTATION RATE: Sed Rate: 15 mm/h (ref 0–22)

## 2015-12-20 MED ORDER — TRIAMCINOLONE ACETONIDE 0.1 % EX OINT
TOPICAL_OINTMENT | Freq: Two times a day (BID) | CUTANEOUS | Status: DC
  Administered 2015-12-20 – 2015-12-21 (×3): via TOPICAL
  Filled 2015-12-20 (×2): qty 15

## 2015-12-20 MED ORDER — METHYLPREDNISOLONE SODIUM SUCC 40 MG IJ SOLR
40.0000 mg | INTRAMUSCULAR | Status: DC
  Administered 2015-12-20 – 2015-12-21 (×2): 40 mg via INTRAVENOUS
  Filled 2015-12-20 (×2): qty 1

## 2015-12-20 NOTE — Progress Notes (Signed)
[facility] PROGRESS NOTE  Linda Lin JYN:[id number] DOB: 27-Jul-1971 DOA: 12/19/2015 PCP: No PCP Per Patient  HPI/Brief narrative 45 y.o. female with hx mild asthma, scabies, remote genital herpes , who is presenting to ER with pain, redness and swelling of bilat lower legs. Pt was recently admitted in Dec 2016 with similar presentation and was treated with IV vanc, IV solumedrol, IVermectin x 1 and to repeat in 7 days. Atarax given for itching. Autoimmune w/u was largely unremarkable. Patient was referred to Dermatology on that discharge  Assessment/Plan: 1. Cellulitis bilat LE's - recurrent problem, had similar presentation in Dec 2016 treated w Vanc/ steroid/ Ivermectin.Patient without florid renal / liver or heart failure. 2d ECHO ordered to eval for pulm HTN or some subclinical valve disease. Patient is continued on empiric IV vanc, IVF's LE dopplers neg for DVT. 2d echo pending 2. Tobacco use 3. Remote genital HSV - hasn't flared in many yrs per pt 4. DVT prophylaxis - Lovenox subQ  Code Status: Full Family Communication: Pt in room Disposition Plan: Uncertain at this time   Consultants:    Procedures:    Antibiotics: Anti-infectives    Start     Dose/Rate Route Frequency Ordered Stop   12/20/15 0400  vancomycin (VANCOCIN) IVPB 750 mg/150 ml premix     750 mg 150 mL/hr over 60 Minutes Intravenous Every 12 hours 12/19/15 1900     12/19/15 1545  vancomycin (VANCOCIN) IVPB 1000 mg/200 mL premix     1,000 mg 200 mL/hr over 60 Minutes Intravenous  Once 12/19/15 1530 12/19/15 1719       HPI/Subjective: Complains of generalized itching involving palms and soles.  Objective: Filed Vitals:   12/19/15 2025 12/19/15 2142 12/20/15 0628 12/20/15 1340  BP:  128/62 109/71 136/95  Pulse:  115 99 93  Temp:  98.9 F (37.2 C) 98.4 F (36.9 C) 97.7 F (36.5 C)  TempSrc:  Oral Oral Oral  Resp:  20 17 18   Height:      Weight:      SpO2: 99% 100% 100% 100%     Intake/Output Summary (Last 24 hours) at 12/20/15 1354 Last data filed at 12/20/15 0900  Gross per 24 hour  Intake    240 ml  Output    300 ml  Net    -60 ml   Filed Weights   12/19/15 1149  Weight: 60.328 kg (133 lb)    Exam:   General:  Awake, in nad  Cardiovascular: regular, s1, s2  Respiratory: normal resp effort, no wheezing  Abdomen: soft, nondsitended  Musculoskeletal: perfused, no clubbing   Data Reviewed: Basic Metabolic Panel:  Recent Labs Lab 12/17/15 1341 12/19/15 1510 12/20/15 0624  NA 139 136 138  K 4.8 4.0 4.5  CL 112* 109 110  CO2 21* 21* 23  GLUCOSE 92 122* 116*  BUN 11 12 11   CREATININE 0.73 0.49 0.46  CALCIUM 9.4 8.7* 8.3*   Liver Function Tests:  Recent Labs Lab 12/17/15 1341 12/19/15 1510  AST 18 20  ALT 16 17  ALKPHOS 120 145*  BILITOT 0.6 0.3  PROT 6.7 7.1  ALBUMIN 3.2* 3.3*   No results for input(s): LIPASE, AMYLASE in the last 168 hours. No results for input(s): AMMONIA in the last 168 hours. CBC:  Recent Labs Lab 12/17/15 1341 12/19/15 1510 12/20/15 0624  WBC 4.5 6.2 5.7  NEUTROABS 2.1 3.7  --   HGB 13.2 14.1 11.9*  HCT 41.5 42.9 36.6  MCV 92.2 90.5  91.0  PLT 253 281 267   Cardiac Enzymes: No results for input(s): CKTOTAL, CKMB, CKMBINDEX, TROPONINI in the last 168 hours. BNP (last 3 results) No results for input(s): BNP in the last 8760 hours.  ProBNP (last 3 results) No results for input(s): PROBNP in the last 8760 hours.  CBG: No results for input(s): GLUCAP in the last 168 hours.  Recent Results (from the past 240 hour(s))  Blood culture (routine x 2)     Status: None (Preliminary result)   Collection Time: 12/19/15  3:10 PM  Result Value Ref Range Status   Specimen Description BLOOD RIGHT FOREARM DRAWN BY RN  Final   Special Requests BOTTLES DRAWN AEROBIC AND ANAEROBIC 5CC  Final   Culture NO GROWTH < 24 HOURS  Final   Report Status PENDING  Incomplete  Blood culture (routine x 2)      Status: None (Preliminary result)   Collection Time: 12/19/15  3:16 PM  Result Value Ref Range Status   Specimen Description BLOOD LEFT ANTECUBITAL  Final   Special Requests   Final    BOTTLES DRAWN AEROBIC AND ANAEROBIC AEB=12CC ANA=8CC   Culture NO GROWTH < 24 HOURS  Final   Report Status PENDING  Incomplete     Studies: US Venous Img Lower Bilateral  12/20/2015  CLINICAL DATA:  Bilateral lower extremity edema with acute on chronic cellulitis for the past 2 weeks. History of smoking. Evaluate for DVT. EXAM: BILATERAL LOWER EXTREMITY VENOUS DOPPLER ULTRASOUND TECHNIQUE: Gray-scale sonography with graded compression, as well as color Doppler and duplex ultrasound were performed to evaluate the lower extremity deep venous systems from the level of the common femoral vein and including the common femoral, femoral, profunda femoral, popliteal and calf veins including the posterior tibial, peroneal and gastrocnemius veins when visible. The superficial great saphenous vein was also interrogated. Spectral Doppler was utilized to evaluate flow at rest and with distal augmentation maneuvers in the common femoral, femoral and popliteal veins. COMPARISON:  None. FINDINGS: RIGHT LOWER EXTREMITY Common Femoral Vein: No evidence of thrombus. Normal compressibility, respiratory phasicity and response to augmentation. Saphenofemoral Junction: No evidence of thrombus. Normal compressibility and flow on color Doppler imaging. Profunda Femoral Vein: No evidence of thrombus. Normal compressibility and flow on color Doppler imaging. Femoral Vein: No evidence of thrombus. Normal compressibility, respiratory phasicity and response to augmentation. Popliteal Vein: No evidence of thrombus. Normal compressibility, respiratory phasicity and response to augmentation. Calf Veins: No evidence of thrombus. Normal compressibility and flow on color Doppler imaging. Superficial Great Saphenous Vein: No evidence of thrombus. Normal  compressibility and flow on color Doppler imaging. Venous Reflux:  None. Other Findings: There is a borderline enlarged though benign appearing right inguinal lymph node which measures approximately 1.4 cm in greatest short axis diameter though maintains a benign fatty hilum (image 10). There is a minimal amount of subcutaneous edema noted at the level of the right lower leg and calf. LEFT LOWER EXTREMITY Common Femoral Vein: No evidence of thrombus. Normal compressibility, respiratory phasicity and response to augmentation. Saphenofemoral Junction: No evidence of thrombus. Normal compressibility and flow on color Doppler imaging. Profunda Femoral Vein: No evidence of thrombus. Normal compressibility and flow on color Doppler imaging. Femoral Vein: No evidence of thrombus. Normal compressibility, respiratory phasicity and response to augmentation. Popliteal Vein: No evidence of thrombus. Normal compressibility, respiratory phasicity and response to augmentation. Calf Veins: No evidence of thrombus. Normal compressibility and flow on color Doppler imaging. Superficial Great Saphenous Vein: No evidence  of thrombus. Normal compressibility and flow on color Doppler imaging. Venous Reflux:  None. Other Findings: There is a prominent though nonenlarged left inguinal lymph node which measures approximately 0.9 cm in diameter and maintains a benign fatty hilum (image 44). IMPRESSION: 1. No evidence of DVT within either lower extremity. 2. Presumably reactive bilateral inguinal lymph nodes. Electronically Signed   By: Simonne Come M.D.   On: 12/20/2015 10:30    Scheduled Meds: . enoxaparin (LOVENOX) injection  40 mg Subcutaneous Q24H  . methylPREDNISolone (SOLU-MEDROL) injection  40 mg Intravenous Q24H  . triamcinolone ointment   Topical BID  . vancomycin  750 mg Intravenous Q12H  . [DISCONTINUED] sodium chloride   Intravenous STAT   Continuous Infusions: . sodium chloride 50 mL/hr at 12/19/15 2010    Principal  Problem:   Bilateral lower leg cellulitis, recurrent Active Problems:   Cellulitis of both lower extremities   Lower extremity edema   History of asthma, mild    Danh Bayus K  [facility] Pager [phone]. If 7PM-7AM, please contact night-coverage at [email or web address], password [facility] 12/20/2015, 1:54 PM  LOS: 1 day

## 2015-12-21 DIAGNOSIS — L259 Unspecified contact dermatitis, unspecified cause: Secondary | ICD-10-CM

## 2015-12-21 MED ORDER — PREDNISONE 5 MG PO TABS
5.0000 mg | ORAL_TABLET | Freq: Every day | ORAL | Status: DC
Start: 2015-12-22 — End: 2015-12-28

## 2015-12-21 MED ORDER — PREDNISONE 5 MG PO TABS: 5.0000 mg | ORAL_TABLET | Freq: Every day | ORAL | Status: DC

## 2015-12-21 MED ORDER — TRIAMCINOLONE ACETONIDE 0.1 % EX OINT: TOPICAL_OINTMENT | Freq: Two times a day (BID) | CUTANEOUS | Status: DC

## 2015-12-21 NOTE — Discharge Summary (Signed)
Physician Discharge Summary  Linda Lin:[id number] DOB: Jun 14, 1971 DOA: 12/19/2015  PCP: No PCP Per Patient  Admit date: 12/19/2015 Discharge date: 12/21/2015  Time spent: 20 minutes  Recommendations for Outpatient Follow-up:  1. Follow up with PCP in 2-3 weeks  2. Follow up with Dermatology on 4/4 at 10am  Discharge Diagnoses:  Principal Problem:   Contact dermatitis Active Problems:   Lower extremity edema   History of asthma, mild   Discharge Condition: Improved  Diet recommendation: Regular  Filed Weights   12/19/15 1149  Weight: 60.328 kg (133 lb)    History of present illness:  Please review dictated H and P from 3/28 for details. Briefly, 45 y.o. female with hx mild asthma, scabies, remote genital herpes , who is presenting to ER with pain, redness and swelling of bilat lower legs. Pt was recently admitted in Dec 2016 with similar presentation and was treated with IV vanc, IV solumedrol, IVermectin x 1 and to repeat in 7 days. Atarax given for itching. Autoimmune w/u was largely unremarkable. Patient was referred to Dermatology on that discharge but did not go secondary to financial issues.  Hospital Course:  1. B LE and generalized rash likely secondary to contact dermatitis - recurrent issue, had similar presentation in Dec 2016 treated w Vanc/ steroid/ Ivermectin.Patient without florid renal / liver or heart failure. 2d ECHO was unremarkable. Pt was initially continued on empiric IV vanc, IVF's. LE dopplers neg for DVT. Patient has reported much improvement with topical steroid. Of note, patient works in [employer] and recalls breaking out in rash when cardboard box scraped across skin while at work. Question contact dermatitis. Patient previously was unable to f/u with Dermatology given financial issues. She is now willing to follow up. Have arranged appt with Dermatology as per above 2. Tobacco use 3. Remote genital HSV - hasn't  flared in many yrs per pt 4. DVT prophylaxis - Lovenox subQ while admitted  Discharge Exam: Filed Vitals:   12/20/15 1340 12/20/15 2127 12/20/15 2139 12/21/15 0500  BP: 136/95  139/66 131/73  Pulse: 93  111 98  Temp: 97.7 F (36.5 C)  98.3 F (36.8 C) 98.1 F (36.7 C)  TempSrc: Oral  Oral Oral  Resp: Height:      Weight:      SpO2: 100% 97% 100% 98%    General: Awake, in nad Cardiovascular: regular, s1, s2 Respiratory: normal resp effort, no wheezing  Discharge Instructions     Medication List    STOP taking these medications        clindamycin 150 MG capsule  Commonly known as:  CLEOCIN      TAKE these medications        diphenhydrAMINE 25 mg capsule  Commonly known as:  BENADRYL  Take 50 mg by mouth every 6 (six) hours as needed for itching.     ibuprofen 200 MG tablet  Commonly known as:  ADVIL,MOTRIN  Take 200 mg by mouth every 6 (six) hours as needed for mild pain or moderate pain.     predniSONE 5 MG tablet  Commonly known as:  DELTASONE  Take 1 tablet (5 mg total) by mouth daily with breakfast.  Start taking on:  12/22/2015     triamcinolone ointment 0.1 %  Commonly known as:  KENALOG  Apply topically 2 (two) times daily. Apply to affected areas       Allergies  Allergen Reactions  . Cephalexin  Rash  . Doxycycline Rash   Follow-up Information    Follow up with Follow up with PCP in 2-3 weeks.   Why:  Hospital follow up      Follow up with REGISTER,AMBER, PA-C On 12/26/2015.   Specialty:  Dermatology   Why:  your Dermatology appointment. Please arrive at 10am   Contact information:   [address] [place] [address] [phone]        The results of significant diagnostics from this hospitalization (including imaging, microbiology, ancillary and laboratory) are listed below for reference.    Significant Diagnostic Studies: US Venous Img Lower Bilateral  12/20/2015  CLINICAL DATA:  Bilateral lower extremity edema  with acute on chronic cellulitis for the past 2 weeks. History of smoking. Evaluate for DVT. EXAM: BILATERAL LOWER EXTREMITY VENOUS DOPPLER ULTRASOUND TECHNIQUE: Gray-scale sonography with graded compression, as well as color Doppler and duplex ultrasound were performed to evaluate the lower extremity deep venous systems from the level of the common femoral vein and including the common femoral, femoral, profunda femoral, popliteal and calf veins including the posterior tibial, peroneal and gastrocnemius veins when visible. The superficial great saphenous vein was also interrogated. Spectral Doppler was utilized to evaluate flow at rest and with distal augmentation maneuvers in the common femoral, femoral and popliteal veins. COMPARISON:  None. FINDINGS: RIGHT LOWER EXTREMITY Common Femoral Vein: No evidence of thrombus. Normal compressibility, respiratory phasicity and response to augmentation. Saphenofemoral Junction: No evidence of thrombus. Normal compressibility and flow on color Doppler imaging. Profunda Femoral Vein: No evidence of thrombus. Normal compressibility and flow on color Doppler imaging. Femoral Vein: No evidence of thrombus. Normal compressibility, respiratory phasicity and response to augmentation. Popliteal Vein: No evidence of thrombus. Normal compressibility, respiratory phasicity and response to augmentation. Calf Veins: No evidence of thrombus. Normal compressibility and flow on color Doppler imaging. Superficial Great Saphenous Vein: No evidence of thrombus. Normal compressibility and flow on color Doppler imaging. Venous Reflux:  None. Other Findings: There is a borderline enlarged though benign appearing right inguinal lymph node which measures approximately 1.4 cm in greatest short axis diameter though maintains a benign fatty hilum (image 10). There is a minimal amount of subcutaneous edema noted at the level of the right lower leg and calf. LEFT LOWER EXTREMITY Common Femoral Vein: No  evidence of thrombus. Normal compressibility, respiratory phasicity and response to augmentation. Saphenofemoral Junction: No evidence of thrombus. Normal compressibility and flow on color Doppler imaging. Profunda Femoral Vein: No evidence of thrombus. Normal compressibility and flow on color Doppler imaging. Femoral Vein: No evidence of thrombus. Normal compressibility, respiratory phasicity and response to augmentation. Popliteal Vein: No evidence of thrombus. Normal compressibility, respiratory phasicity and response to augmentation. Calf Veins: No evidence of thrombus. Normal compressibility and flow on color Doppler imaging. Superficial Great Saphenous Vein: No evidence of thrombus. Normal compressibility and flow on color Doppler imaging. Venous Reflux:  None. Other Findings: There is a prominent though nonenlarged left inguinal lymph node which measures approximately 0.9 cm in diameter and maintains a benign fatty hilum (image 44). IMPRESSION: 1. No evidence of DVT within either lower extremity. 2. Presumably reactive bilateral inguinal lymph nodes. Electronically Signed   By: Simonne Come M.D.   On: 12/20/2015 10:30    Microbiology: Recent Results (from the past 240 hour(s))  Blood culture (routine x 2)     Status: None (Preliminary result)   Collection Time: 12/19/15  3:10 PM  Result Value Ref Range Status  Specimen Description BLOOD RIGHT FOREARM DRAWN BY RN  Final   Special Requests BOTTLES DRAWN AEROBIC AND ANAEROBIC 5CC  Final   Culture NO GROWTH 2 DAYS  Final   Report Status PENDING  Incomplete  Blood culture (routine x 2)     Status: None (Preliminary result)   Collection Time: 12/19/15  3:16 PM  Result Value Ref Range Status   Specimen Description BLOOD LEFT ANTECUBITAL  Final   Special Requests   Final    BOTTLES DRAWN AEROBIC AND ANAEROBIC AEB=12CC ANA=8CC   Culture NO GROWTH 2 DAYS  Final   Report Status PENDING  Incomplete     Labs: Basic Metabolic Panel:  Recent  Labs Lab 12/17/15 1341 12/19/15 1510 12/20/15 0624  NA 139 136 138  K 4.8 4.0 4.5  CL 112* 109 110  CO2 21* 21* 23  GLUCOSE 92 122* 116*  BUN 11 12 11   CREATININE 0.73 0.49 0.46  CALCIUM 9.4 8.7* 8.3*   Liver Function Tests:  Recent Labs Lab 12/17/15 1341 12/19/15 1510  AST 18 20  ALT 16 17  ALKPHOS 120 145*  BILITOT 0.6 0.3  PROT 6.7 7.1  ALBUMIN 3.2* 3.3*   No results for input(s): LIPASE, AMYLASE in the last 168 hours. No results for input(s): AMMONIA in the last 168 hours. CBC:  Recent Labs Lab 12/17/15 1341 12/19/15 1510 12/20/15 0624  WBC 4.5 6.2 5.7  NEUTROABS 2.1 3.7  --   HGB 13.2 14.1 11.9*  HCT 41.5 42.9 36.6  MCV 92.2 90.5 91.0  PLT 253 281 267   Cardiac Enzymes: No results for input(s): CKTOTAL, CKMB, CKMBINDEX, TROPONINI in the last 168 hours. BNP: BNP (last 3 results) No results for input(s): BNP in the last 8760 hours.  ProBNP (last 3 results) No results for input(s): PROBNP in the last 8760 hours.  CBG: No results for input(s): GLUCAP in the last 168 hours.   Signed:  CHIU, Scheryl Marten  [facility] 12/21/2015, 5:14 PM

## 2015-12-21 NOTE — Progress Notes (Signed)
Pt discharged home today per Dr. Rhona Leavens.  Pt's IV site D/C'd and WDL.  Pt's VSS.  Pt provided with home medication list, discharge instructions and prescriptions.  Verbalized understanding.  Pt is to leave floor via WC in stable condition accompanied by nursing staff.

## 2015-12-24 LAB — CULTURE, BLOOD (ROUTINE X 2)
CULTURE: NO GROWTH
Culture: NO GROWTH

## 2015-12-28 ENCOUNTER — Encounter (HOSPITAL_COMMUNITY): Payer: Self-pay

## 2015-12-28 ENCOUNTER — Emergency Department (HOSPITAL_COMMUNITY)
Admission: EM | Admit: 2015-12-28 | Discharge: 2015-12-28 | Disposition: A | Discharge status: Home / Self Care | Payer: Self-pay | Attending: Emergency Medicine | Admitting: Emergency Medicine

## 2015-12-28 ENCOUNTER — Emergency Department (HOSPITAL_COMMUNITY): Payer: Self-pay

## 2015-12-28 DIAGNOSIS — F17210 Nicotine dependence, cigarettes, uncomplicated: Secondary | ICD-10-CM | POA: Insufficient documentation

## 2015-12-28 DIAGNOSIS — J45901 Unspecified asthma with (acute) exacerbation: Secondary | ICD-10-CM | POA: Insufficient documentation

## 2015-12-28 LAB — BASIC METABOLIC PANEL
Anion gap: 7 (ref 5–15)
BUN: 24 mg/dL — AB (ref 6–20)
CALCIUM: 8.7 mg/dL — AB (ref 8.9–10.3)
CHLORIDE: 105 mmol/L (ref 101–111)
CO2: 23 mmol/L (ref 22–32)
CREATININE: 0.62 mg/dL (ref 0.44–1.00)
GFR calc Af Amer: >60 mL/min (ref >60)
Glucose, Bld: 91 mg/dL (ref 65–99)
Potassium: 3.9 mmol/L (ref 3.5–5.1)
SODIUM: 135 mmol/L (ref 135–145)

## 2015-12-28 LAB — CBC
HCT: 36.9 % (ref 36.0–46.0)
Hemoglobin: 12.1 g/dL (ref 12.0–15.0)
MCH: 30 pg (ref 26.0–34.0)
MCHC: 32.8 g/dL (ref 30.0–36.0)
MCV: 91.6 fL (ref 78.0–100.0)
PLATELETS: 305 10*3/uL (ref 150–400)
RBC: 4.03 MIL/uL (ref 3.87–5.11)
RDW: 12.9 % (ref 11.5–15.5)
WBC: 12.8 10*3/uL — AB (ref 4.0–10.5)

## 2015-12-28 LAB — TROPONIN I
Troponin I: <0.03 ng/mL (ref <0.031)
Troponin I: <0.03 ng/mL (ref <0.031)

## 2015-12-28 MED ORDER — PREDNISONE 20 MG PO TABS: 40.0000 mg | ORAL_TABLET | Freq: Every day | ORAL | Status: DC

## 2015-12-28 MED ORDER — PREDNISONE 50 MG PO TABS
60.0000 mg | ORAL_TABLET | Freq: Once | ORAL | Status: AC
  Administered 2015-12-28: 60 mg via ORAL
  Filled 2015-12-28: qty 1

## 2015-12-28 MED ORDER — ALBUTEROL SULFATE HFA 108 (90 BASE) MCG/ACT IN AERS: 2.0000 | INHALATION_SPRAY | RESPIRATORY_TRACT | Status: DC | PRN

## 2015-12-28 MED ORDER — ALBUTEROL (5 MG/ML) CONTINUOUS INHALATION SOLN
10.0000 mg/h | INHALATION_SOLUTION | Freq: Once | RESPIRATORY_TRACT | Status: AC
  Administered 2015-12-28: 10 mg/h via RESPIRATORY_TRACT
  Filled 2015-12-28: qty 20

## 2015-12-28 MED ORDER — SODIUM CHLORIDE 0.9 % IV BOLUS (SEPSIS)
1000.0000 mL | Freq: Once | INTRAVENOUS | Status: AC
  Administered 2015-12-28: 1000 mL via INTRAVENOUS

## 2015-12-28 MED ORDER — IPRATROPIUM BROMIDE 0.02 % IN SOLN
1.0000 mg | Freq: Once | RESPIRATORY_TRACT | Status: AC
  Administered 2015-12-28: 1 mg via RESPIRATORY_TRACT
  Filled 2015-12-28: qty 5

## 2015-12-28 NOTE — ED Notes (Signed)
Upon hourly rounding pt's breathing tx was disconnected from the medical air. Connection was re-attached and pt was adjusted in the bed.

## 2015-12-28 NOTE — Discharge Instructions (Signed)
Take the prescriptions as directed.  Use your albuterol inhaler (2 to 4 puffs) every 4 hours for the next 7 days, then as needed for cough, wheezing, or shortness of breath.  Call your regular medical doctor today to schedule a follow up appointment within the next 2 days.  Return to the Emergency Department immediately sooner if worsening.  ° °

## 2015-12-28 NOTE — ED Notes (Signed)
RT at bedside.

## 2015-12-28 NOTE — ED Provider Notes (Signed)
CSN: [id number]     Arrival date & time 12/28/15  0645 History   First MD Initiated Contact with Patient 12/28/15 [phone]     Chief Complaint  Patient presents with  . Chest Pain  . Wheezing     HPI  Pt was seen at 0710.  Per pt, c/o gradual onset and worsening of persistent cough, wheezing and SOB for the past 2 days. Has been associated with constant chest "tightness." Symptoms "woke her up from sleep" 2 days ago. Denies palpitations, no back pain, no abd pain, no N/V/D, no fevers, no rash.    Past Medical History  Diagnosis Date  . Asthma   . Cellulitis   . Scabies    History reviewed. No pertinent past surgical history.  Social History  Substance Use Topics  . Smoking status: Current Every Day Smoker -- 0.25 packs/day for 20 years    Types: Cigarettes  . Smokeless tobacco: Never Used  . Alcohol Use: Yes     Comment: occ   OB History    Gravida Para Term Preterm AB TAB SAB Ectopic Multiple Living   0 0 0 0 0 0 0 0 0 0      Review of Systems ROS: Statement: All systems negative except as marked or noted in the HPI; Constitutional: Negative for fever and chills. ; ; Eyes: Negative for eye pain, redness and discharge. ; ; ENMT: Negative for ear pain, hoarseness, nasal congestion, sinus pressure and sore throat. ; ; Cardiovascular: Negative for palpitations, diaphoresis, and peripheral edema. ; ; Respiratory: +cough, wheezing, SOB, "chest tightness." Negative for stridor. ; ; Gastrointestinal: Negative for nausea, vomiting, diarrhea, abdominal pain, blood in stool, hematemesis, jaundice and rectal bleeding. . ; ; Genitourinary: Negative for dysuria, flank pain and hematuria. ; ; Musculoskeletal: Negative for back pain and neck pain. Negative for swelling and trauma.; ; Skin: Negative for pruritus, rash, abrasions, blisters, bruising and skin lesion.; ; Neuro: Negative for headache, lightheadedness and neck stiffness. Negative for weakness, altered level of consciousness , altered mental  status, extremity weakness, paresthesias, involuntary movement, seizure and syncope.      Allergies  Cephalexin and Doxycycline  Home Medications   Prior to Admission medications   Medication Sig Start Date End Date Taking? Authorizing Provider  diphenhydrAMINE (BENADRYL) 25 mg capsule Take 50 mg by mouth every 6 (six) hours as needed for itching.    Historical Provider, MD  ibuprofen (ADVIL,MOTRIN) 200 MG tablet Take 200 mg by mouth every 6 (six) hours as needed for mild pain or moderate pain.    Historical Provider, MD  predniSONE (DELTASONE) 5 MG tablet Take 1 tablet (5 mg total) by mouth daily with breakfast. 12/22/15   Jerald Kief, MD  triamcinolone ointment (KENALOG) 0.1 % Apply topically 2 (two) times daily. Apply to affected areas 12/21/15   Jerald Kief, MD   BP 152/98 mmHg  Pulse 119  Temp(Src) 99.9 F (37.7 C)  Ht 5\' 3"  (1.6 m)  Wt 133 lb (60.328 kg)  BMI 23.57 kg/m2  SpO2 97%  LMP 12/19/2015  BP 136/77 mmHg  Pulse 118  Temp(Src) 99.2 F (37.3 C) (Oral)  Resp 22  Ht 5\' 3"  (1.6 m)  Wt 133 lb (60.328 kg)  BMI 23.57 kg/m2  SpO2 98%  LMP 12/19/2015  Physical Exam  0715: Physical examination:  Nursing notes reviewed; Vital signs and O2 SAT reviewed;  Constitutional: Well developed, Well nourished, Well hydrated, Uncomfortable appearing; Head:  Normocephalic, atraumatic; Eyes: EOMI, PERRL,  No scleral icterus; ENMT: Mouth and pharynx normal, Mucous membranes moist; Neck: Supple, Full range of motion, No lymphadenopathy; Cardiovascular: Tachycardic rate and rhythm, No murmur, rub, or gallop; Respiratory: Breath sounds diminished & equal bilaterally, insp/exp wheezes bilat. No audible wheezing. Speaking long phrases. Sitting upright, tachypneic.; Chest: Nontender, Movement normal; Abdomen: Soft, Nontender, Nondistended, Normal bowel sounds; Genitourinary: No CVA tenderness; Extremities: Pulses normal, No tenderness, No edema, No calf edema or asymmetry.; Neuro: AA&Ox3,  Major CN grossly intact.  Speech clear. No gross focal motor or sensory deficits in extremities.; Skin: Color normal, Warm, Dry.   ED Course  Procedures (including critical care time) Labs Review  Imaging Review  I have personally reviewed and evaluated these images and lab results as part of my medical decision-making.   EKG Interpretation   Date/Time:  Thursday December 28 2015 06:51:37 EDT Ventricular Rate:  122 PR Interval:  103 QRS Duration: 77 QT Interval:  270 QTC Calculation: 385 R Axis:   78 Text Interpretation:  Sinus tachycardia Consider right atrial enlargement  Left ventricular hypertrophy Nonspecific ST and T wave abnormality When  compared with ECG of 12/19/2015 Nonspecific ST and T wave abnormality is  now Present Confirmed by [facility]  MD, Nicholos Johns [phone]) on 12/28/2015 7:41:44  AM      MDM  MDM Reviewed: previous chart, nursing note and vitals Reviewed previous: labs and ECG Interpretation: ECG, labs and x-ray     Results for orders placed or performed during the hospital encounter of 12/28/15  Basic metabolic panel  Result Value Ref Range   Sodium 135 135 - 145 mmol/L   Potassium 3.9 3.5 - 5.1 mmol/L   Chloride 105 101 - 111 mmol/L   CO2 23 22 - 32 mmol/L   Glucose, Bld 91 65 - 99 mg/dL   BUN 24 (H) 6 - 20 mg/dL   Creatinine, Ser 5.69 0.44 - 1.00 mg/dL   Calcium 8.7 (L) 8.9 - 10.3 mg/dL   GFR calc non Af Amer >60 >60 mL/min   GFR calc Af Amer >60 >60 mL/min   Anion gap 7 5 - 15  CBC  Result Value Ref Range   WBC 12.8 (H) 4.0 - 10.5 K/uL   RBC 4.03 3.87 - 5.11 MIL/uL   Hemoglobin 12.1 12.0 - 15.0 g/dL   HCT 79.4 80.1 - 65.5 %   MCV 91.6 78.0 - 100.0 fL   MCH 30.0 26.0 - 34.0 pg   MCHC 32.8 30.0 - 36.0 g/dL   RDW 37.4 82.7 - 07.8 %   Platelets 305 150 - 400 K/uL  Troponin I  Result Value Ref Range   Troponin I <0.03 <0.031 ng/mL   Dg Chest 2 View 12/28/2015  CLINICAL DATA:  Bilateral wheezing.  Dyspnea EXAM: CHEST  2 VIEW COMPARISON:  None.  FINDINGS: Mild bronchitic markings most convincing in the lateral projection where there is cuffing. There is no edema, consolidation, effusion, or pneumothorax. Normal heart size and mediastinal contours. IMPRESSION: Mild bronchitic markings.  No collapse or pneumonia. Electronically Signed   By: Marnee Spring M.D.   On: 12/28/2015 07:48   US Venous Img Lower Bilateral 12/20/2015  CLINICAL DATA:  Bilateral lower extremity edema with acute on chronic cellulitis for the past 2 weeks. History of smoking. Evaluate for DVT. EXAM: BILATERAL LOWER EXTREMITY VENOUS DOPPLER ULTRASOUND TECHNIQUE: Gray-scale sonography with graded compression, as well as color Doppler and duplex ultrasound were performed to evaluate the lower extremity deep venous systems from the level of the  common femoral vein and including the common femoral, femoral, profunda femoral, popliteal and calf veins including the posterior tibial, peroneal and gastrocnemius veins when visible. The superficial great saphenous vein was also interrogated. Spectral Doppler was utilized to evaluate flow at rest and with distal augmentation maneuvers in the common femoral, femoral and popliteal veins. COMPARISON:  None. FINDINGS: RIGHT LOWER EXTREMITY Common Femoral Vein: No evidence of thrombus. Normal compressibility, respiratory phasicity and response to augmentation. Saphenofemoral Junction: No evidence of thrombus. Normal compressibility and flow on color Doppler imaging. Profunda Femoral Vein: No evidence of thrombus. Normal compressibility and flow on color Doppler imaging. Femoral Vein: No evidence of thrombus. Normal compressibility, respiratory phasicity and response to augmentation. Popliteal Vein: No evidence of thrombus. Normal compressibility, respiratory phasicity and response to augmentation. Calf Veins: No evidence of thrombus. Normal compressibility and flow on color Doppler imaging. Superficial Great Saphenous Vein: No evidence of thrombus.  Normal compressibility and flow on color Doppler imaging. Venous Reflux:  None. Other Findings: There is a borderline enlarged though benign appearing right inguinal lymph node which measures approximately 1.4 cm in greatest short axis diameter though maintains a benign fatty hilum (image 10). There is a minimal amount of subcutaneous edema noted at the level of the right lower leg and calf. LEFT LOWER EXTREMITY Common Femoral Vein: No evidence of thrombus. Normal compressibility, respiratory phasicity and response to augmentation. Saphenofemoral Junction: No evidence of thrombus. Normal compressibility and flow on color Doppler imaging. Profunda Femoral Vein: No evidence of thrombus. Normal compressibility and flow on color Doppler imaging. Femoral Vein: No evidence of thrombus. Normal compressibility, respiratory phasicity and response to augmentation. Popliteal Vein: No evidence of thrombus. Normal compressibility, respiratory phasicity and response to augmentation. Calf Veins: No evidence of thrombus. Normal compressibility and flow on color Doppler imaging. Superficial Great Saphenous Vein: No evidence of thrombus. Normal compressibility and flow on color Doppler imaging. Venous Reflux:  None. Other Findings: There is a prominent though nonenlarged left inguinal lymph node which measures approximately 0.9 cm in diameter and maintains a benign fatty hilum (image 44). IMPRESSION: 1. No evidence of DVT within either lower extremity. 2. Presumably reactive bilateral inguinal lymph nodes. Electronically Signed   By: Simonne Come M.D.   On: 12/20/2015 10:30     1215:  Pt states she "feels better" after neb and steroid.  NAD, lungs CTA bilat, no wheezing, resps easy, speaking full sentences, Sats 100% R/A.  Pt ambulated around the ED with Sats remaining 95 % R/A, resps easy, NAD.  Pt has tol PO well without N/V while in the ED. IVF given for tachycardia with improvement. Doubt PE as cause for symptoms with low risk  Wells and Vasc US bilat LE's negative for DVT less than 1 week ago.  Doubt ACS as cause for symptoms with normal troponin x2 after 2 days of constant symptoms.  Pt states she wants to go home now. Tx symptomatically, f/u PMD. Dx and testing d/w pt.  Questions answered.  Verb understanding, agreeable to d/c home with outpt f/u.    Samuel Jester, DO 01/01/16 [phone]

## 2015-12-28 NOTE — ED Notes (Signed)
Chest pain started yesterday evening that awoke pt from sleeping.  Pt reports yesterday pain was worse with cough, but "not so much now".

## 2015-12-28 NOTE — ED Notes (Signed)
Pt tolerated ambulation with steady gait and o2 sats at 95% on room air and HR around 140 (1 hr breathing treatment prior to ambulation).

## 2015-12-28 NOTE — ED Notes (Signed)
Pt states she has hx of asthma with no attack in 5 years. Pt has bilateral wheezing (inspiratory and expiratory). Dyspnea with exertion.

## 2016-01-09 ENCOUNTER — Emergency Department (HOSPITAL_COMMUNITY)
Admission: EM | Admit: 2016-01-09 | Discharge: 2016-01-09 | Disposition: A | Discharge status: Home / Self Care | Payer: Self-pay | Attending: Emergency Medicine | Admitting: Emergency Medicine

## 2016-01-09 ENCOUNTER — Emergency Department (HOSPITAL_COMMUNITY): Payer: Self-pay

## 2016-01-09 ENCOUNTER — Encounter (HOSPITAL_COMMUNITY): Payer: Self-pay | Admitting: Emergency Medicine

## 2016-01-09 DIAGNOSIS — J45901 Unspecified asthma with (acute) exacerbation: Secondary | ICD-10-CM | POA: Insufficient documentation

## 2016-01-09 DIAGNOSIS — F17210 Nicotine dependence, cigarettes, uncomplicated: Secondary | ICD-10-CM | POA: Insufficient documentation

## 2016-01-09 LAB — CBC
HEMATOCRIT: 37.7 % (ref 36.0–46.0)
Hemoglobin: 12.2 g/dL (ref 12.0–15.0)
MCH: 29.5 pg (ref 26.0–34.0)
MCHC: 32.4 g/dL (ref 30.0–36.0)
MCV: 91.1 fL (ref 78.0–100.0)
Platelets: 260 10*3/uL (ref 150–400)
RBC: 4.14 MIL/uL (ref 3.87–5.11)
RDW: 12.2 % (ref 11.5–15.5)
WBC: 9.9 10*3/uL (ref 4.0–10.5)

## 2016-01-09 LAB — TROPONIN I

## 2016-01-09 LAB — BASIC METABOLIC PANEL
ANION GAP: 11 (ref 5–15)
BUN: 22 mg/dL — ABNORMAL HIGH (ref 6–20)
CHLORIDE: 106 mmol/L (ref 101–111)
CO2: 22 mmol/L (ref 22–32)
CREATININE: 0.55 mg/dL (ref 0.44–1.00)
Calcium: 8.7 mg/dL — ABNORMAL LOW (ref 8.9–10.3)
GFR calc non Af Amer: >60 mL/min (ref >60)
Glucose, Bld: 111 mg/dL — ABNORMAL HIGH (ref 65–99)
POTASSIUM: 3.3 mmol/L — AB (ref 3.5–5.1)
SODIUM: 139 mmol/L (ref 135–145)

## 2016-01-09 MED ORDER — MAGNESIUM SULFATE 2 GM/50ML IV SOLN
2.0000 g | Freq: Once | INTRAVENOUS | Status: AC
  Administered 2016-01-09: 2 g via INTRAVENOUS
  Filled 2016-01-09: qty 50

## 2016-01-09 MED ORDER — ALBUTEROL (5 MG/ML) CONTINUOUS INHALATION SOLN
15.0000 mg/h | INHALATION_SOLUTION | Freq: Once | RESPIRATORY_TRACT | Status: AC
  Administered 2016-01-09: 15 mg/h via RESPIRATORY_TRACT
  Filled 2016-01-09: qty 20

## 2016-01-09 MED ORDER — SODIUM CHLORIDE 0.9 % IV BOLUS (SEPSIS)
1000.0000 mL | Freq: Once | INTRAVENOUS | Status: AC
  Administered 2016-01-09: 1000 mL via INTRAVENOUS

## 2016-01-09 MED ORDER — ONDANSETRON HCL 4 MG/2ML IJ SOLN
4.0000 mg | Freq: Once | INTRAMUSCULAR | Status: AC
  Administered 2016-01-09: 4 mg via INTRAVENOUS
  Filled 2016-01-09: qty 2

## 2016-01-09 MED ORDER — METHYLPREDNISOLONE SODIUM SUCC 125 MG IJ SOLR
125.0000 mg | Freq: Once | INTRAMUSCULAR | Status: AC
  Administered 2016-01-09: 125 mg via INTRAVENOUS
  Filled 2016-01-09: qty 2

## 2016-01-09 MED ORDER — IPRATROPIUM BROMIDE 0.02 % IN SOLN
0.5000 mg | Freq: Once | RESPIRATORY_TRACT | Status: AC
  Administered 2016-01-09: 0.5 mg via RESPIRATORY_TRACT
  Filled 2016-01-09: qty 2.5

## 2016-01-09 MED ORDER — SODIUM CHLORIDE 0.9 % IV BOLUS (SEPSIS)
1000.0000 mL | Freq: Once | INTRAVENOUS | Status: AC
Start: 2016-01-09 — End: 2016-01-09
  Administered 2016-01-09: 1000 mL via INTRAVENOUS

## 2016-01-09 MED ORDER — ALBUTEROL SULFATE HFA 108 (90 BASE) MCG/ACT IN AERS: 2.0000 | INHALATION_SPRAY | RESPIRATORY_TRACT | Status: DC | PRN

## 2016-01-09 MED ORDER — AZITHROMYCIN 250 MG PO TABS: ORAL_TABLET | ORAL | Status: DC

## 2016-01-09 MED ORDER — PREDNISONE 20 MG PO TABS: ORAL_TABLET | ORAL | Status: DC

## 2016-01-09 MED ORDER — SODIUM CHLORIDE 0.9 % IV BOLUS (SEPSIS)
500.0000 mL | Freq: Once | INTRAVENOUS | Status: AC
  Administered 2016-01-09: 500 mL via INTRAVENOUS

## 2016-01-09 NOTE — ED Notes (Signed)
EKG done at 0253 and given to Dr Lynelle Doctor

## 2016-01-09 NOTE — ED Notes (Signed)
MD Lynelle Doctor notified of HR in 130's

## 2016-01-09 NOTE — Discharge Instructions (Signed)
Use your inhaler for wheezing and shortness of breath. Take the prednisone until gone. Take the antibiotics until gone. You can take mucinex DM OTC for cough as needed. You can be rechecked at [facility] until you get a primary care doctor. Recheck if you get worse again.  Asthma Attack Prevention While you may not be able to control the fact that you have asthma, you can take actions to prevent asthma attacks. The best way to prevent asthma attacks is to maintain good control of your asthma. You can achieve this by: 1. Taking your medicines as directed. 2. Avoiding things that can irritate your airways or make your asthma symptoms worse (asthma triggers). 3. Keeping track of how well your asthma is controlled and of any changes in your symptoms. 4. Responding quickly to worsening asthma symptoms (asthma attack). 5. Seeking emergency care when it is needed. WHAT ARE SOME WAYS TO PREVENT AN ASTHMA ATTACK? Have a Plan Work with your health care provider to create a written plan for managing and treating your asthma attacks (asthma action plan). This plan includes:  A list of your asthma triggers and how you can avoid them.  Information on when medicines should be taken and when their dosages should be changed.  The use of a device that measures how well your lungs are working (peak flow meter). Monitor Your Asthma Use your peak flow meter and record your results in a journal every day. A drop in your peak flow numbers on one or more days may indicate the start of an asthma attack. This can happen even before you start to feel symptoms. You can prevent an asthma attack from getting worse by following the steps in your asthma action plan. Avoid Asthma Triggers Work with your asthma health care provider to find out what your asthma triggers are. This can be done by:  Allergy testing.  Keeping a journal that notes when asthma attacks occur and the factors that may have contributed to  them.  Determining if there are other medical conditions that are making your asthma worse. Once you have determined your asthma triggers, take steps to avoid them. This may include avoiding excessive or prolonged exposure to:  Dust. Have someone dust and vacuum your home for you once or twice a week. Using a high-efficiency particulate arrestance (HEPA) vacuum is best.  Smoke. This includes campfire smoke, forest fire smoke, and secondhand smoke from tobacco products.  Pet dander. Avoid contact with animals that you know you are allergic to.  Allergens from trees, grasses or pollens. Avoid spending a lot of time outdoors when pollen counts are high, and on very windy days.  Very cold, dry, or humid air.  Mold.  Foods that contain high amounts of sulfites.  Strong odors.  Outdoor air pollutants, such as Museum/gallery exhibitions officer.  Indoor air pollutants, such as aerosol sprays and fumes from household cleaners.  Household pests, including dust mites and cockroaches, and pest droppings.  Certain medicines, including NSAIDs. Always talk to your health care provider before stopping or starting any new medicines. Medicines Take over-the-counter and prescription medicines only as told by your health care provider. Many asthma attacks can be prevented by carefully following your medicine schedule. Taking your medicines correctly is especially important when you cannot avoid certain asthma triggers. Act Quickly If an asthma attack does happen, acting quickly can decrease how severe it is and how long it lasts. Take these steps:   Pay attention to your symptoms. If  you are coughing, wheezing, or having difficulty breathing, do not wait to see if your symptoms go away on their own. Follow your asthma action plan.  If you have followed your asthma action plan and your symptoms are not improving, call your health care provider or seek immediate medical care at the nearest hospital. It is important to  note how often you need to use your fast-acting rescue inhaler. If you are using your rescue inhaler more often, it may mean that your asthma is not under control. Adjusting your asthma treatment plan may help you to prevent future asthma attacks and help you to gain better control of your condition. HOW CAN I PREVENT AN ASTHMA ATTACK WHEN I EXERCISE? Follow advice from your health care provider about whether you should use your fast-acting inhaler before exercising. Many people with asthma experience exercise-induced bronchoconstriction (EIB). This condition often worsens during vigorous exercise in cold, humid, or dry environments. Usually, people with EIB can stay very active by pre-treating with a fast-acting inhaler before exercising.   This information is not intended to replace advice given to you by your health care provider. Make sure you discuss any questions you have with your health care provider.   Document Released: 08/28/2009 Document Revised: 05/31/2015 Document Reviewed: 02/09/2015 Elsevier Interactive Patient Education 2016 [employer]  Metered Dose Inhaler With Spacer Inhaled medicines are the basis of treatment of asthma and other breathing problems. Inhaled medicine can only be effective if used properly. Good technique assures that the medicine reaches the lungs. Your health care provider has asked you to use a spacer with your inhaler to help you take the medicine more effectively. A spacer is a plastic tube with a mouthpiece on one end and an opening that connects to the inhaler on the other end. Metered dose inhalers (MDIs) are used to deliver a variety of inhaled medicines. These include quick relief or rescue medicines (such as bronchodilators) and controller medicines (such as corticosteroids). The medicine is delivered by pushing down on a metal canister to release a set amount of spray. If you are using different kinds of inhalers, use your quick relief medicine to open  the airways 10-15 minutes before using a steroid if instructed to do so by your health care provider. If you are unsure which inhalers to use and the order of using them, ask your health care provider, nurse, or respiratory therapist. HOW TO USE THE INHALER WITH A SPACER 6. Remove cap from inhaler. 7. If you are using the inhaler for the first time, you will need to prime it. Shake the inhaler for 5 seconds and release four puffs into the air, away from your face. Ask your health care provider or pharmacist if you have questions about priming your inhaler. 8. Shake inhaler for 5 seconds before each breath in (inhalation). 9. Place the open end of the spacer onto the mouthpiece of the inhaler. 10. Position the inhaler so that the top of the canister faces up and the spacer mouthpiece faces you. 11. Put your index finger on the top of the medicine canister. Your thumb supports the bottom of the inhaler and the spacer. 12. Breathe out (exhale) normally and as completely as possible. 13. Immediately after exhaling, place the spacer between your teeth and into your mouth. Close your mouth tightly around the spacer. 14. Press the canister down with the index finger to release the medicine. 15. At the same time as the canister is pressed, inhale deeply and slowly  until the lungs are completely filled. This should take 4-6 seconds. Keep your tongue down and out of the way. 16. Hold the medicine in your lungs for 5-10 seconds (10 seconds is best). This helps the medicine get into the small airways of your lungs. Exhale. 17. Repeat inhaling deeply through the spacer mouthpiece. Again hold that breath for up to 10 seconds (10 seconds is best). Exhale slowly. If it is difficult to take this second deep breath through the spacer, breathe normally several times through the spacer. Remove the spacer from your mouth. 18. Wait at least 15-30 seconds between puffs. Continue with the above steps until you have taken the  number of puffs your health care provider has ordered. Do not use the inhaler more than your health care provider directs you to. 19. Remove spacer from the inhaler and place cap on inhaler. 20. Follow the directions from your health care provider or the inhaler insert for cleaning the inhaler and spacer. If you are using a steroid inhaler, rinse your mouth with water after your last puff, gargle, and spit out the water. Do not swallow the water. AVOID:  Inhaling before or after starting the spray of medicine. It takes practice to coordinate your breathing with triggering the spray.  Inhaling through the nose (rather than the mouth) when triggering the spray. HOW TO DETERMINE IF YOUR INHALER IS FULL OR NEARLY EMPTY You cannot know when an inhaler is empty by shaking it. A few inhalers are now being made with dose counters. Ask your health care provider for a prescription that has a dose counter if you feel you need that extra help. If your inhaler does not have a counter, ask your health care provider to help you determine the date you need to refill your inhaler. Write the refill date on a calendar or your inhaler canister. Refill your inhaler 7-10 days before it runs out. Be sure to keep an adequate supply of medicine. This includes making sure it is not expired, and you have a spare inhaler.  SEEK MEDICAL CARE IF:   Symptoms are only partially relieved with your inhaler.  You are having trouble using your inhaler.  You experience some increase in phlegm. SEEK IMMEDIATE MEDICAL CARE IF:   You feel little or no relief with your inhalers. You are still wheezing and are feeling shortness of breath or tightness in your chest or both.  You have dizziness, headaches, or fast heart rate.  You have chills, fever, or night sweats.  There is a noticeable increase in phlegm production, or there is blood in the phlegm.   This information is not intended to replace advice given to you by your health  care provider. Make sure you discuss any questions you have with your health care provider.   Document Released: 09/09/2005 Document Revised: 01/24/2015 Document Reviewed: 02/25/2013 Elsevier Interactive Patient Education [employer]

## 2016-01-09 NOTE — ED Provider Notes (Signed)
CSN: [id number]     Arrival date & time 01/09/16  0245 History   First MD Initiated Contact with Patient 01/09/16 [phone]    Chief Complaint  Patient presents with  . Chest Pain     (Consider location/radiation/quality/duration/timing/severity/associated sxs/prior Treatment) HPI patient has a history of reactive airway disease and states it mainly flares up when she gets respiratory infections. She reports she has had a flareup off and on for the last 2 weeks. She states she has been having tightness in her chest, shortness of breath, wheezing, and a cough that sometimes produces dark yellow and sometimes green gray sputum. She states she had fever to 101 on April 16. She had chills that day. She has a sore throat only when she coughs. She's had nausea without vomiting. She denies diarrhea but states her stools a bit loose. She states tonight she went to work at a [employer] and she smelled something that made her breathing worse.  PCP none  Past Medical History  Diagnosis Date  . Asthma   . Cellulitis   . Scabies    History reviewed. No pertinent past surgical history. History reviewed. No pertinent family history. Social History  Substance Use Topics  . Smoking status: Current Every Day Smoker -- 0.25 packs/day for 20 years    Types: Cigarettes  . Smokeless tobacco: Never Used  . Alcohol Use: Yes     Comment: occ   employed  OB History    Gravida Para Term Preterm AB TAB SAB Ectopic Multiple Living   0 0 0 0 0 0 0 0 0 0      Review of Systems  All other systems reviewed and are negative.     Allergies  Cephalexin and Doxycycline  Home Medications   Prior to Admission medications   Medication Sig Start Date End Date Taking? Authorizing Provider  albuterol (PROVENTIL HFA;VENTOLIN HFA) 108 (90 Base) MCG/ACT inhaler Inhale 2 puffs into the lungs every 4 (four) hours as needed for wheezing or shortness of breath. 01/09/16   Devoria Albe, MD  azithromycin (ZITHROMAX)  250 MG tablet Take 2 po the first day then once a day for the next 4 days. 01/09/16   Devoria Albe, MD  diphenhydrAMINE (BENADRYL) 25 mg capsule Take 50 mg by mouth every 6 (six) hours as needed for itching.    Historical Provider, MD  ibuprofen (ADVIL,MOTRIN) 200 MG tablet Take 200 mg by mouth every 6 (six) hours as needed for mild pain or moderate pain.    Historical Provider, MD  predniSONE (DELTASONE) 20 MG tablet Take 3 po QD x 3d , then 2 po QD x 3d then 1 po QD x 3d 01/09/16   Devoria Albe, MD  triamcinolone ointment (KENALOG) 0.1 % Apply topically 2 (two) times daily. Apply to affected areas Patient not taking: Reported on 12/28/2015 12/21/15   Jerald Kief, MD   BP 138/91 mmHg  Pulse 110  Temp(Src) 98.5 F (36.9 C) (Oral)  Resp 31  Ht 5\' 2"  (1.575 m)  Wt 131 lb (59.421 kg)  BMI 23.95 kg/m2  SpO2 96%  LMP 12/19/2015  Vital signs normal except for tachycardia  Physical Exam  Constitutional: She is oriented to person, place, and time. She appears well-developed and well-nourished.  Non-toxic appearance. She does not appear ill. No distress.  HENT:  Head: Normocephalic and atraumatic.  Right Ear: External ear normal.  Left Ear: External ear normal.  Nose: Nose normal. No mucosal edema or rhinorrhea.  Mouth/Throat: Oropharynx is clear and moist and mucous membranes are normal. No dental abscesses or uvula swelling.  Eyes: Conjunctivae and EOM are normal. Pupils are equal, round, and reactive to light.  Neck: Normal range of motion and full passive range of motion without pain. Neck supple.  Cardiovascular: Normal rate, regular rhythm and normal heart sounds.  Exam reveals no gallop and no friction rub.   No murmur heard. Pulmonary/Chest: Tachypnea noted. She is in respiratory distress. She has decreased breath sounds. She has wheezes. She has no rhonchi. She has no rales. She exhibits no tenderness and no crepitus.  Abdominal: Soft. Normal appearance and bowel sounds are normal. She  exhibits no distension. There is no tenderness. There is no rebound and no guarding.  Musculoskeletal: Normal range of motion. She exhibits no edema or tenderness.  Moves all extremities well.   Neurological: She is alert and oriented to person, place, and time. She has normal strength. No cranial nerve deficit.  Skin: Skin is warm, dry and intact. No rash noted. No erythema. No pallor.  Psychiatric: She has a normal mood and affect. Her speech is normal and behavior is normal. Her mood appears not anxious.  Nursing note and vitals reviewed.   ED Course  Procedures (including critical care time)  Medications  methylPREDNISolone sodium succinate (SOLU-MEDROL) 125 mg/2 mL injection 125 mg (125 mg Intravenous Given 01/09/16 0359)  albuterol (PROVENTIL,VENTOLIN) solution continuous neb (15 mg/hr Nebulization Given 01/09/16 0405)  ipratropium (ATROVENT) nebulizer solution 0.5 mg (0.5 mg Nebulization Given 01/09/16 0405)  ondansetron (ZOFRAN) injection 4 mg (4 mg Intravenous Given 01/09/16 0523)  sodium chloride 0.9 % bolus 1,000 mL (0 mLs Intravenous Stopped 01/09/16 0612)  sodium chloride 0.9 % bolus 500 mL (500 mLs Intravenous New Bag/Given 01/09/16 0523)  magnesium sulfate IVPB 2 g 50 mL (2 g Intravenous New Bag/Given 01/09/16 0544)  sodium chloride 0.9 % bolus 1,000 mL (1,000 mLs Intravenous New Bag/Given 01/09/16 0610)   We discussed her test results. Patient was given a continuous nebulizer of albuterol/Atrovent. She also is given IV Solu-Medrol.  Recheck at 5:10 AM patient has significant tachycardia heart rate around 147. She states she is still a little bit shaky after the continuous nebulizer. On her exam she has a few low pitched rhonchi but improved air movement. She was given IV fluids and given IV magnesium to hopefully help relieve the bronchospasm that she still has present since she is still tachycardic to get more albuterol at this point.  Recheck at 6:05 AM patient's feeling better.  Her heart rate now is 135. She's almost received her first liter of IV fluids and states she does not have the need to have urinary output. She states she is breathing better. Her shakiness is better. On reexam her lungs now are clear. Am going to give her another liter of IV fluids for her tachycardia. She does admit since she's been feeling bad she's not been eating or drinking well. She may have an element of dehydration present.  6:40 AM patient had urinary output. She states she still shaky after the nebulizer but she's improving. Her breathing is still improved. Her heart rate now is 125. Patient feels ready to be discharged. She states she has a spacer to use with her inhaler.  Patient's vital signs were reviewed from March 19 to the present. She generally has a tachycardia and only has 8 heart rates under the rate of 100 out of 39 vital signs that have a heart  rate documented.   Labs Review Results for orders placed or performed during the hospital encounter of 01/09/16  CBC  Result Value Ref Range   WBC 9.9 4.0 - 10.5 K/uL   RBC 4.14 3.87 - 5.11 MIL/uL   Hemoglobin 12.2 12.0 - 15.0 g/dL   HCT 16.1 09.6 - 04.5 %   MCV 91.1 78.0 - 100.0 fL   MCH 29.5 26.0 - 34.0 pg   MCHC 32.4 30.0 - 36.0 g/dL   RDW 40.9 81.1 - 91.4 %   Platelets 260 150 - 400 K/uL  Basic metabolic panel  Result Value Ref Range   Sodium 139 135 - 145 mmol/L   Potassium 3.3 (L) 3.5 - 5.1 mmol/L   Chloride 106 101 - 111 mmol/L   CO2 22 22 - 32 mmol/L   Glucose, Bld 111 (H) 65 - 99 mg/dL   BUN 22 (H) 6 - 20 mg/dL   Creatinine, Ser 7.82 0.44 - 1.00 mg/dL   Calcium 8.7 (L) 8.9 - 10.3 mg/dL   GFR calc non Af Amer >60 >60 mL/min   GFR calc Af Amer >60 >60 mL/min   Anion gap 11 5 - 15  Troponin I  Result Value Ref Range   Troponin I <0.03 <0.031 ng/mL   Laboratory interpretation all normal   Imaging Review Dg Chest 2 View  01/09/2016  CLINICAL DATA:  Midsternal chest pain and shortness of breath. Onset  yesterday. EXAM: CHEST  2 VIEW COMPARISON:  Chest radiographs 12/28/2015. FINDINGS: Mild bronchitic changes are stable. The cardiomediastinal contours are normal. Pulmonary vasculature is normal. No consolidation, pleural effusion, or pneumothorax. No acute osseous abnormalities are seen. IMPRESSION: Stable bronchitic change over the last 12 days.  No consolidation. Electronically Signed   By: Rubye Oaks M.D.   On: 01/09/2016 03:51   I have personally reviewed and evaluated these images and lab results as part of my medical decision-making.   EKG Interpretation   Date/Time:  Tuesday January 09 2016 02:54:27 EDT Ventricular Rate:  119 PR Interval:  97 QRS Duration: 77 QT Interval:  305 QTC Calculation: 429 R Axis:   84 Text Interpretation:  Sinus tachycardia Left ventricular hypertrophy No  significant change since last tracing 28 Dec 2015 Confirmed by Anthony Roland   MD-I, Kajsa Butrum ([id number]) on 01/09/2016 3:43:31 AM      MDM   Final diagnoses:  Exacerbation of asthma   New Prescriptions   ALBUTEROL (PROVENTIL HFA;VENTOLIN HFA) 108 (90 BASE) MCG/ACT INHALER    Inhale 2 puffs into the lungs every 4 (four) hours as needed for wheezing or shortness of breath.   AZITHROMYCIN (ZITHROMAX) 250 MG TABLET    Take 2 po the first day then once a day for the next 4 days.   PREDNISONE (DELTASONE) 20 MG TABLET    Take 3 po QD x 3d , then 2 po QD x 3d then 1 po QD x 3d    Plan discharge  Devoria Albe, MD, Concha Pyo, MD 01/09/16 [phone]

## 2016-01-09 NOTE — ED Notes (Signed)
MD at bedside. 

## 2016-01-09 NOTE — ED Notes (Signed)
MD Lynelle Doctor notified of heart rate at this time.

## 2016-01-09 NOTE — ED Notes (Signed)
Patient complaining of mid sternal chest pain with shortness of breath starting yesterday at 1900. States she has had a cough x 2 weeks.

## 2016-05-02 ENCOUNTER — Encounter (HOSPITAL_COMMUNITY): Payer: Self-pay | Admitting: Emergency Medicine

## 2016-05-02 ENCOUNTER — Emergency Department (HOSPITAL_COMMUNITY)
Admission: EM | Admit: 2016-05-02 | Discharge: 2016-05-02 | Disposition: A | Discharge status: Home / Self Care | Payer: 59 | Attending: Emergency Medicine | Admitting: Emergency Medicine

## 2016-05-02 ENCOUNTER — Emergency Department (HOSPITAL_COMMUNITY): Payer: 59

## 2016-05-02 DIAGNOSIS — F17210 Nicotine dependence, cigarettes, uncomplicated: Secondary | ICD-10-CM | POA: Insufficient documentation

## 2016-05-02 DIAGNOSIS — M79604 Pain in right leg: Secondary | ICD-10-CM | POA: Insufficient documentation

## 2016-05-02 DIAGNOSIS — J45909 Unspecified asthma, uncomplicated: Secondary | ICD-10-CM | POA: Diagnosis not present

## 2016-05-02 DIAGNOSIS — R079 Chest pain, unspecified: Secondary | ICD-10-CM | POA: Insufficient documentation

## 2016-05-02 DIAGNOSIS — Z7951 Long term (current) use of inhaled steroids: Secondary | ICD-10-CM | POA: Insufficient documentation

## 2016-05-02 LAB — CBC
HEMATOCRIT: 35.1 % — AB (ref 36.0–46.0)
Hemoglobin: 11.7 g/dL — ABNORMAL LOW (ref 12.0–15.0)
MCH: 30 pg (ref 26.0–34.0)
MCHC: 33.3 g/dL (ref 30.0–36.0)
MCV: 90 fL (ref 78.0–100.0)
PLATELETS: 243 10*3/uL (ref 150–400)
RBC: 3.9 MIL/uL (ref 3.87–5.11)
RDW: 12.3 % (ref 11.5–15.5)
WBC: 6 10*3/uL (ref 4.0–10.5)

## 2016-05-02 LAB — BASIC METABOLIC PANEL
ANION GAP: 6 (ref 5–15)
BUN: 13 mg/dL (ref 6–20)
CALCIUM: 8.8 mg/dL — AB (ref 8.9–10.3)
CO2: 21 mmol/L — AB (ref 22–32)
Chloride: 110 mmol/L (ref 101–111)
Creatinine, Ser: 0.58 mg/dL (ref 0.44–1.00)
GFR calc Af Amer: >60 mL/min (ref >60)
GFR calc non Af Amer: >60 mL/min (ref >60)
GLUCOSE: 135 mg/dL — AB (ref 65–99)
Potassium: 3.5 mmol/L (ref 3.5–5.1)
Sodium: 137 mmol/L (ref 135–145)

## 2016-05-02 LAB — D-DIMER, QUANTITATIVE: D-Dimer, Quant: 0.43 ug/mL-FEU (ref 0.00–0.50)

## 2016-05-02 LAB — TROPONIN I: Troponin I: <0.03 ng/mL (ref <0.03)

## 2016-05-02 MED ORDER — IBUPROFEN 400 MG PO TABS: 400.0000 mg | ORAL_TABLET | Freq: Four times a day (QID) | ORAL | 0 refills | Status: DC | PRN

## 2016-05-02 MED ORDER — KETOROLAC TROMETHAMINE 30 MG/ML IJ SOLN
30.0000 mg | Freq: Once | INTRAMUSCULAR | Status: AC
  Administered 2016-05-02: 30 mg via INTRAVENOUS
  Filled 2016-05-02: qty 1

## 2016-05-02 MED ORDER — SULFAMETHOXAZOLE-TRIMETHOPRIM 800-160 MG PO TABS: 1.0000 | ORAL_TABLET | Freq: Two times a day (BID) | ORAL | 0 refills | Status: AC

## 2016-05-02 NOTE — ED Triage Notes (Signed)
Patient states she was diagnosed with cellulitis on her lower right leg 2 months ago and "it is worse." Also complaining of chest pain x 2 days.

## 2016-05-02 NOTE — ED Provider Notes (Signed)
[facility]-EMERGENCY DEPT Provider Note   CSN: [id number] Arrival date & time: 05/02/16  2120  First Provider Contact:  First MD Initiated Contact with Patient 05/02/16 2207      By signing my name below, I, Aggie Moats, attest that this documentation has been prepared under the direction and in the presence of Eber Hong, MD. Electronically Signed: Aggie Moats, ED Scribe. 05/02/16. 10:22 PM.   History   Chief Complaint Chief Complaint  Patient presents with  . Cellulitis  . Chest Pain      The history is provided by the patient. No language interpreter was used.   HPI Comments:  CARRIEANNE KLEEN is a 45 y.o. female, with a PMHx of asthma, cellulitis and scabies, who presents to the Emergency Department complaining of moderate, intermittent mid-sternal chest pain, which started 2 days ago. Pain is localized in mid-sternal, and sharp. Associated symptoms include nausea, dizziness and intermittent vomiting. Pain exacerbated occasionally deep breaths and eases when she calms herself. Denies pain with coughing or sneezing.  Pt also mentioned gradually worsening right lower leg pain, which started 2 months ago. Associated symptoms per pt include tenderness to touch in right leg and fever of 103 a week ago. Hx of cellulitis and scabies. Last on antibiotics in April. No recent travel, fractures or surgery. No hx of blood clots, cancer or BC use. Pt mentioned that grandmother had history of blood clots and heart disease.     Past Medical History:  Diagnosis Date  . Asthma   . Cellulitis   . Scabies     Patient Active Problem List   Diagnosis Date Noted  . Contact dermatitis 12/21/2015  . Bilateral lower leg cellulitis, recurrent 12/19/2015  . Lower extremity edema 12/19/2015  . History of asthma, mild 12/19/2015  . Maculopapular rash, generalized 08/30/2015  . Tobacco abuse 08/30/2015  . Cellulitis of both lower extremities 08/27/2015    History reviewed. No pertinent surgical  history.  OB History    Gravida Para Term Preterm AB Living   0 0 0 0 0 0   SAB TAB Ectopic Multiple Live Births   0 0 0 0         Home Medications    Prior to Admission medications   Medication Sig Start Date End Date Taking? Authorizing Provider  albuterol (PROVENTIL HFA;VENTOLIN HFA) 108 (90 Base) MCG/ACT inhaler Inhale 2 puffs into the lungs every 4 (four) hours as needed for wheezing or shortness of breath. 01/09/16  Yes Devoria Albe, MD  diphenhydrAMINE (BENADRYL) 25 mg capsule Take 50 mg by mouth every 6 (six) hours as needed for itching.   Yes Historical Provider, MD  ibuprofen (ADVIL,MOTRIN) 400 MG tablet Take 1 tablet (400 mg total) by mouth every 6 (six) hours as needed. 05/02/16   Eber Hong, MD  sulfamethoxazole-trimethoprim (BACTRIM DS,SEPTRA DS) 800-160 MG tablet Take 1 tablet by mouth 2 (two) times daily. 05/02/16 05/09/16  Eber Hong, MD    Family History History reviewed. No pertinent family history.  Social History Social History  Substance Use Topics  . Smoking status: Current Every Day Smoker    Packs/day: 0.25    Years: 20.00    Types: Cigarettes  . Smokeless tobacco: Never Used  . Alcohol use Yes     Comment: occ     Allergies   Cephalexin and Doxycycline   Review of Systems Review of Systems  Cardiovascular: Positive for chest pain.  Gastrointestinal: Positive for nausea and vomiting.  Musculoskeletal: Positive for  myalgias.       Right leg pain  Neurological: Positive for dizziness.  All other systems reviewed and are negative.    Physical Exam Updated Vital Signs BP 142/72 (BP Location: Left Arm)   Pulse (!) 125   Temp 98.9 F (37.2 C) (Oral)   Resp 20   Ht 5\' 3"  (1.6 m)   Wt 134 lb (60.8 kg)   LMP 04/13/2016   SpO2 99%   BMI 23.74 kg/m   Physical Exam  Constitutional: She is oriented to person, place, and time. She appears well-developed and well-nourished. No distress.  HENT:  Head: Normocephalic and atraumatic.    Mouth/Throat: Oropharynx is clear and moist. No oropharyngeal exudate.  Eyes: Conjunctivae and EOM are normal. Pupils are equal, round, and reactive to light. Right eye exhibits no discharge. Left eye exhibits no discharge. No scleral icterus.  Neck: Normal range of motion. Neck supple. No JVD present. No thyromegaly present.  Cardiovascular: Normal heart sounds and intact distal pulses.  Exam reveals no gallop and no friction rub.   No murmur heard. Tachycardia  Pulmonary/Chest: Effort normal and breath sounds normal. No respiratory distress. She has no wheezes. She has no rales.  Abdominal: Soft. Bowel sounds are normal. She exhibits no distension and no mass. There is no tenderness.  Musculoskeletal: Normal range of motion. She exhibits tenderness. She exhibits no edema.  Tender with palpation of right ankle, w/o asymmetry. No edema or appreciable redness.  Lymphadenopathy:    She has no cervical adenopathy.  Neurological: She is alert and oriented to person, place, and time. Coordination normal.  Skin: Skin is warm and dry. No rash noted. No erythema.  Psychiatric: She has a normal mood and affect. Her behavior is normal.  Nursing note and vitals reviewed.     ED Treatments / Results  DIAGNOSTIC STUDIES:  Oxygen Saturation is 99% on room air, normal by my interpretation.    COORDINATION OF CARE:  10:18 PM Discussed treatment plan with pt at bedside, which include blood tests to r/o blood clot, and pt agreed to plan.  Labs (all labs ordered are listed, but only abnormal results are displayed) Labs Reviewed  CBC - Abnormal; Notable for the following:       Result Value   Hemoglobin 11.7 (*)    HCT 35.1 (*)    All other components within normal limits  BASIC METABOLIC PANEL - Abnormal; Notable for the following:    CO2 21 (*)    Glucose, Bld 135 (*)    Calcium 8.8 (*)    All other components within normal limits  TROPONIN I  D-DIMER, QUANTITATIVE (NOT AT [facility])    EKG   EKG Interpretation  Date/Time:  Thursday May 02 2016 21:31:26 EDT Ventricular Rate:  114 PR Interval:  130 QRS Duration: 80 QT Interval:  312 QTC Calculation: 430 R Axis:   81 Text Interpretation:  Sinus tachycardia Otherwise normal ECG since last tracing no significant change Confirmed by Hyacinth Meeker  MD, Tannor Pyon ([id number]) on 05/02/2016 10:43:20 PM       Radiology Dg Chest 2 View  Result Date: 05/02/2016 CLINICAL DATA:  Acute chest pain for 2 days. EXAM: CHEST  2 VIEW COMPARISON:  01/09/2016 chest radiograph FINDINGS: The cardiomediastinal silhouette is unremarkable. There is no evidence of focal airspace disease, pulmonary edema, suspicious pulmonary nodule/mass, pleural effusion, or pneumothorax. No acute bony abnormalities are identified. IMPRESSION: No active cardiopulmonary disease. Electronically Signed   By: Henrietta Hoover.D.  On: 05/02/2016 22:44    Procedures Procedures (including critical care time)  Medications Ordered in ED Medications  ketorolac (TORADOL) 30 MG/ML injection 30 mg (30 mg Intravenous Given 05/02/16 2234)     Initial Impression / Assessment and Plan / ED Course  I have reviewed the triage vital signs and the nursing notes.  Pertinent labs & imaging results that were available during my care of the patient were reviewed by me and considered in my medical decision making (see chart for details).  Clinical Course  Comment By Time  Pt informed of findings - has no leukocytosis and normal D dimer - normal trop and unremarkable ECG other than mild tachycardia - no pain with breathing or position here. Eber Hong, MD 08/10 2256   Possible cellulitis - bactrim and home.  Final Clinical Impressions(s) / ED Diagnoses   Final diagnoses:  Nonspecific chest pain  Pain of right lower extremity    New Prescriptions New Prescriptions   IBUPROFEN (ADVIL,MOTRIN) 400 MG TABLET    Take 1 tablet (400 mg total) by mouth every 6 (six) hours as needed.    SULFAMETHOXAZOLE-TRIMETHOPRIM (BACTRIM DS,SEPTRA DS) 800-160 MG TABLET    Take 1 tablet by mouth 2 (two) times daily.  I personally performed the services described in this documentation, which was scribed in my presence. The recorded information has been reviewed and is accurate.        Eber Hong, MD 05/02/16 [phone]

## 2016-05-02 NOTE — Discharge Instructions (Signed)

## 2016-05-08 ENCOUNTER — Encounter (HOSPITAL_COMMUNITY): Payer: Self-pay | Admitting: Emergency Medicine

## 2016-05-08 ENCOUNTER — Emergency Department (HOSPITAL_COMMUNITY)
Admission: EM | Admit: 2016-05-08 | Discharge: 2016-05-08 | Disposition: A | Discharge status: Home / Self Care | Payer: 59 | Attending: Emergency Medicine | Admitting: Emergency Medicine

## 2016-05-08 DIAGNOSIS — E860 Dehydration: Secondary | ICD-10-CM | POA: Diagnosis not present

## 2016-05-08 DIAGNOSIS — F17210 Nicotine dependence, cigarettes, uncomplicated: Secondary | ICD-10-CM | POA: Insufficient documentation

## 2016-05-08 DIAGNOSIS — R112 Nausea with vomiting, unspecified: Secondary | ICD-10-CM

## 2016-05-08 DIAGNOSIS — Z79899 Other long term (current) drug therapy: Secondary | ICD-10-CM | POA: Insufficient documentation

## 2016-05-08 DIAGNOSIS — M79661 Pain in right lower leg: Secondary | ICD-10-CM

## 2016-05-08 DIAGNOSIS — R197 Diarrhea, unspecified: Secondary | ICD-10-CM

## 2016-05-08 DIAGNOSIS — J45909 Unspecified asthma, uncomplicated: Secondary | ICD-10-CM | POA: Diagnosis not present

## 2016-05-08 LAB — CBC WITH DIFFERENTIAL/PLATELET
BASOS ABS: 0 10*3/uL (ref 0.0–0.1)
BASOS PCT: 0 %
Eosinophils Absolute: 0.1 10*3/uL (ref 0.0–0.7)
Eosinophils Relative: 1 %
HEMATOCRIT: 37.4 % (ref 36.0–46.0)
Hemoglobin: 12.4 g/dL (ref 12.0–15.0)
LYMPHS PCT: 26 %
Lymphs Abs: 2.1 10*3/uL (ref 0.7–4.0)
MCH: 29.5 pg (ref 26.0–34.0)
MCHC: 33.2 g/dL (ref 30.0–36.0)
MCV: 89 fL (ref 78.0–100.0)
Monocytes Absolute: 0.8 10*3/uL (ref 0.1–1.0)
Monocytes Relative: 10 %
NEUTROS ABS: 5.2 10*3/uL (ref 1.7–7.7)
Neutrophils Relative %: 63 %
PLATELETS: 269 10*3/uL (ref 150–400)
RBC: 4.2 MIL/uL (ref 3.87–5.11)
RDW: 12.4 % (ref 11.5–15.5)
WBC: 8.1 10*3/uL (ref 4.0–10.5)

## 2016-05-08 LAB — BASIC METABOLIC PANEL
ANION GAP: 9 (ref 5–15)
BUN: 18 mg/dL (ref 6–20)
CHLORIDE: 106 mmol/L (ref 101–111)
CO2: 20 mmol/L — ABNORMAL LOW (ref 22–32)
Calcium: 9.2 mg/dL (ref 8.9–10.3)
Creatinine, Ser: 1.36 mg/dL — ABNORMAL HIGH (ref 0.44–1.00)
GFR, EST AFRICAN AMERICAN: 53 mL/min — AB (ref >60)
GFR, EST NON AFRICAN AMERICAN: 46 mL/min — AB (ref >60)
Glucose, Bld: 101 mg/dL — ABNORMAL HIGH (ref 65–99)
POTASSIUM: 4.5 mmol/L (ref 3.5–5.1)
SODIUM: 135 mmol/L (ref 135–145)

## 2016-05-08 MED ORDER — KETOROLAC TROMETHAMINE 30 MG/ML IJ SOLN
30.0000 mg | Freq: Once | INTRAMUSCULAR | Status: AC
  Administered 2016-05-08: 30 mg via INTRAVENOUS
  Filled 2016-05-08: qty 1

## 2016-05-08 MED ORDER — ONDANSETRON HCL 4 MG/2ML IJ SOLN
4.0000 mg | Freq: Once | INTRAMUSCULAR | Status: AC
  Administered 2016-05-08: 4 mg via INTRAVENOUS
  Filled 2016-05-08: qty 2

## 2016-05-08 MED ORDER — SODIUM CHLORIDE 0.9 % IV BOLUS (SEPSIS)
1000.0000 mL | Freq: Once | INTRAVENOUS | Status: AC
  Administered 2016-05-08: 1000 mL via INTRAVENOUS

## 2016-05-08 MED ORDER — ONDANSETRON HCL 4 MG PO TABS: 4.0000 mg | ORAL_TABLET | Freq: Three times a day (TID) | ORAL | 0 refills | Status: DC | PRN

## 2016-05-08 MED ORDER — CLARITHROMYCIN 500 MG PO TABS: 500.0000 mg | ORAL_TABLET | Freq: Two times a day (BID) | ORAL | 0 refills | Status: DC

## 2016-05-08 NOTE — Discharge Instructions (Signed)
Drink plenty of fluids (clear liquids) the next 6-8 hours then start a bland diet such as toast, crackers, jello, [employer] chicken noodle soup . Use the zofran for nausea or vomiting. Take imodium OTC for diarrhea. Avoid mild products until the diarrhea is gone. Recheck if you get worse. Take the new antibiotic until gone. You can take ibuprofen 600 mg + acetaminophen 650 mg 4 times a day for pain.  Recheck if you get a fever, have open draining wounds, you see increased swelling, warmth or redness to your skin.

## 2016-05-08 NOTE — ED Triage Notes (Signed)
Pt c/o vomiting that started tonight with muscle cramps and right lower leg pain.

## 2016-05-08 NOTE — ED Provider Notes (Signed)
[facility] DEPT Provider Note   CSN: [id number] Arrival date & time: 05/08/16  0208  Time seen 2:50 AM   History   Chief Complaint Chief Complaint  Patient presents with  . Emesis    HPI Linda Lin is a 45 y.o. female.  HPI she reports she had cellulitis in her lower extremity/2 year and she gets a flareup about every 2 months that requires her to be admitted to the hospital for couple weeks. However she states she was last admitted in April at [facility]. She states last week she had a knot on her right lower extremity however she came to the ED on August 10 and it was no longer there. She was placed on Septra DS at that point for possible cellulitis. She states she still has pain in her leg. She states last night the bump reappeared however it's gone now. She denies any fever or drainage. She reports tonight at work she started getting nausea, vomiting, diarrhea. She's vomited 3 times and had diarrhea twice. She denies abdominal pain or fever. She states about an hour ago her hands, feet, and back started cramping.   PCP none  Past Medical History:  Diagnosis Date  . Asthma   . Cellulitis   . Scabies     Patient Active Problem List   Diagnosis Date Noted  . Contact dermatitis 12/21/2015  . Bilateral lower leg cellulitis, recurrent 12/19/2015  . Lower extremity edema 12/19/2015  . History of asthma, mild 12/19/2015  . Maculopapular rash, generalized 08/30/2015  . Tobacco abuse 08/30/2015  . Cellulitis of both lower extremities 08/27/2015    History reviewed. No pertinent surgical history.  OB History    Gravida Para Term Preterm AB Living   0 0 0 0 0 0   SAB TAB Ectopic Multiple Live Births   0 0 0 0         Home Medications    Prior to Admission medications   Medication Sig Start Date End Date Taking? Authorizing Provider  albuterol (PROVENTIL HFA;VENTOLIN HFA) 108 (90 Base) MCG/ACT inhaler Inhale 2 puffs into the lungs every 4 (four)  hours as needed for wheezing or shortness of breath. 01/09/16   Devoria Albe, MD  clarithromycin (BIAXIN) 500 MG tablet Take 1 tablet (500 mg total) by mouth 2 (two) times daily. 05/08/16   Devoria Albe, MD  diphenhydrAMINE (BENADRYL) 25 mg capsule Take 50 mg by mouth every 6 (six) hours as needed for itching.    Historical Provider, MD  ibuprofen (ADVIL,MOTRIN) 400 MG tablet Take 1 tablet (400 mg total) by mouth every 6 (six) hours as needed. 05/02/16   Eber Hong, MD  ondansetron (ZOFRAN) 4 MG tablet Take 1 tablet (4 mg total) by mouth every 8 (eight) hours as needed. 05/08/16   Devoria Albe, MD  sulfamethoxazole-trimethoprim (BACTRIM DS,SEPTRA DS) 800-160 MG tablet Take 1 tablet by mouth 2 (two) times daily. 05/02/16 05/09/16  Eber Hong, MD    Family History No family history on file.  Social History Social History  Substance Use Topics  . Smoking status: Current Every Day Smoker    Packs/day: 0.25    Years: 20.00    Types: Cigarettes  . Smokeless tobacco: Never Used  . Alcohol use Yes     Comment: occ  employed Smokes 1/2 ppd   Allergies   Cephalexin and Doxycycline   Review of Systems Review of Systems  All other systems reviewed and are negative.    Physical  Exam Updated Vital Signs BP 122/78   Pulse 73   Temp 98.1 F (36.7 C)   Resp 20   Ht 5\' 3"  (1.6 m)   Wt 134 lb (60.8 kg)   LMP 04/13/2016   SpO2 100%   BMI 23.74 kg/m   Vital signs normal    Physical Exam  Constitutional: She is oriented to person, place, and time. She appears well-developed and well-nourished.  Non-toxic appearance. She does not appear ill. No distress.  HENT:  Head: Normocephalic and atraumatic.  Right Ear: External ear normal.  Left Ear: External ear normal.  Nose: Nose normal. No mucosal edema or rhinorrhea.  Mouth/Throat: Oropharynx is clear and moist and mucous membranes are normal. No dental abscesses or uvula swelling.  Tongue mildly dry  Eyes: Conjunctivae and EOM are normal.  Pupils are equal, round, and reactive to light.  Neck: Normal range of motion and full passive range of motion without pain. Neck supple.  Cardiovascular: Normal rate, regular rhythm and normal heart sounds.  Exam reveals no gallop and no friction rub.   No murmur heard. Pulmonary/Chest: Effort normal and breath sounds normal. No respiratory distress. She has no wheezes. She has no rhonchi. She has no rales. She exhibits no tenderness and no crepitus.  Abdominal: Soft. Normal appearance and bowel sounds are normal. She exhibits no distension. There is no tenderness. There is no rebound and no guarding.  Musculoskeletal: Normal range of motion. She exhibits no edema or tenderness.  Moves all extremities well.   Neurological: She is alert and oriented to person, place, and time. She has normal strength. No cranial nerve deficit.  Skin: Skin is warm, dry and intact. No rash noted. No erythema. No pallor.  Patient is noted to have hyperpigmented irritated areas on her lower extremities bilaterally. She is tender medially just above her medial malleolus of her right ankle. There is no definite abscess felt. There is no signs of acute cellulitis such as warmth, redness, or drainage.  Psychiatric: She has a normal mood and affect. Her speech is normal and behavior is normal. Her mood appears not anxious.  Nursing note and vitals reviewed.   Lateral RLE    Medial RLE     ED Treatments / Results  Labs (all labs ordered are listed, but only abnormal results are displayed) Results for orders placed or performed during the hospital encounter of 05/08/16  Basic metabolic panel  Result Value Ref Range   Sodium 135 135 - 145 mmol/L   Potassium 4.5 3.5 - 5.1 mmol/L   Chloride 106 101 - 111 mmol/L   CO2 20 (L) 22 - 32 mmol/L   Glucose, Bld 101 (H) 65 - 99 mg/dL   BUN 18 6 - 20 mg/dL   Creatinine, Ser 1.61 (H) 0.44 - 1.00 mg/dL   Calcium 9.2 8.9 - 09.6 mg/dL   GFR calc non Af Amer 46 (L) >60  mL/min   GFR calc Af Amer 53 (L) >60 mL/min   Anion gap 9 5 - 15  CBC with Differential  Result Value Ref Range   WBC 8.1 4.0 - 10.5 K/uL   RBC 4.20 3.87 - 5.11 MIL/uL   Hemoglobin 12.4 12.0 - 15.0 g/dL   HCT 04.5 40.9 - 81.1 %   MCV 89.0 78.0 - 100.0 fL   MCH 29.5 26.0 - 34.0 pg   MCHC 33.2 30.0 - 36.0 g/dL   RDW 91.4 78.2 - 95.6 %   Platelets 269 150 - 400  K/uL   Neutrophils Relative % 63 %   Neutro Abs 5.2 1.7 - 7.7 K/uL   Lymphocytes Relative 26 %   Lymphs Abs 2.1 0.7 - 4.0 K/uL   Monocytes Relative 10 %   Monocytes Absolute 0.8 0.1 - 1.0 K/uL   Eosinophils Relative 1 %   Eosinophils Absolute 0.1 0.0 - 0.7 K/uL   Basophils Relative 0 %   Basophils Absolute 0.0 0.0 - 0.1 K/uL   Laboratory interpretation all normal except renal insufficiency c/w dehydration   Procedures Procedures (including critical care time)  Medications Ordered in ED Medications  sodium chloride 0.9 % bolus 1,000 mL (1,000 mLs Intravenous New Bag/Given 05/08/16 0425)  sodium chloride 0.9 % bolus 1,000 mL (0 mLs Intravenous Stopped 05/08/16 0425)  ondansetron (ZOFRAN) injection 4 mg (4 mg Intravenous Given 05/08/16 0329)  ketorolac (TORADOL) 30 MG/ML injection 30 mg (30 mg Intravenous Given 05/08/16 0331)     Initial Impression / Assessment and Plan / ED Course  I have reviewed the triage vital signs and the nursing notes.  Pertinent labs & imaging results that were available during my care of the patient were reviewed by me and considered in my medical decision making (see chart for details).  Clinical Course   Patient was given IV fluids for dehydration. We discussed checking laboratory results to make sure she wasn't hypokalemic from her vomiting and diarrhea which would cause the cramping in her extremities.  Review of her last ED visit on Aug 10, she had a neg ddimer during that visit.  Recheck at 04:45 AM she states she is feeling better, getting her second liter of NS. Her nausea is gone.  Discussed her lab tests which were c/w dehydration. Will add an antibiotic for her leg. She is allergic to keflex and doxycycline. She was put on biaxin.   Final Clinical Impressions(s) / ED Diagnoses   Final diagnoses:  Nausea vomiting and diarrhea  Dehydration  Pain in right lower leg    New Prescriptions New Prescriptions   CLARITHROMYCIN (BIAXIN) 500 MG TABLET    Take 1 tablet (500 mg total) by mouth 2 (two) times daily.   ONDANSETRON (ZOFRAN) 4 MG TABLET    Take 1 tablet (4 mg total) by mouth every 8 (eight) hours as needed.    Plan discharge  Devoria AlbeIva Kaylinn Dedic, MD, Concha PyoFACEP    Dorthula Bier, MD 05/08/16 [phone]

## 2016-12-06 ENCOUNTER — Emergency Department (HOSPITAL_COMMUNITY)
Admission: EM | Admit: 2016-12-06 | Discharge: 2016-12-06 | Disposition: A | Discharge status: Home / Self Care | Payer: Managed Care, Other (non HMO) | Attending: Emergency Medicine | Admitting: Emergency Medicine

## 2016-12-06 ENCOUNTER — Emergency Department (HOSPITAL_COMMUNITY): Payer: Managed Care, Other (non HMO)

## 2016-12-06 ENCOUNTER — Encounter (HOSPITAL_COMMUNITY): Payer: Self-pay | Admitting: *Deleted

## 2016-12-06 DIAGNOSIS — R110 Nausea: Secondary | ICD-10-CM | POA: Insufficient documentation

## 2016-12-06 DIAGNOSIS — M25571 Pain in right ankle and joints of right foot: Secondary | ICD-10-CM | POA: Diagnosis present

## 2016-12-06 DIAGNOSIS — Z79899 Other long term (current) drug therapy: Secondary | ICD-10-CM | POA: Insufficient documentation

## 2016-12-06 DIAGNOSIS — F17210 Nicotine dependence, cigarettes, uncomplicated: Secondary | ICD-10-CM | POA: Diagnosis not present

## 2016-12-06 DIAGNOSIS — J45909 Unspecified asthma, uncomplicated: Secondary | ICD-10-CM | POA: Diagnosis not present

## 2016-12-06 LAB — CBC WITH DIFFERENTIAL/PLATELET
Basophils Absolute: 0 10*3/uL (ref 0.0–0.1)
Basophils Relative: 0 %
Eosinophils Absolute: 0.1 10*3/uL (ref 0.0–0.7)
Eosinophils Relative: 2 %
HEMATOCRIT: 37.5 % (ref 36.0–46.0)
HEMOGLOBIN: 12.5 g/dL (ref 12.0–15.0)
LYMPHS ABS: 2.3 10*3/uL (ref 0.7–4.0)
Lymphocytes Relative: 59 %
MCH: 29.7 pg (ref 26.0–34.0)
MCHC: 33.3 g/dL (ref 30.0–36.0)
MCV: 89.1 fL (ref 78.0–100.0)
MONOS PCT: 18 %
Monocytes Absolute: 0.7 10*3/uL (ref 0.1–1.0)
NEUTROS ABS: 0.8 10*3/uL — AB (ref 1.7–7.7)
NEUTROS PCT: 21 %
Platelets: 199 10*3/uL (ref 150–400)
RBC: 4.21 MIL/uL (ref 3.87–5.11)
RDW: 12.6 % (ref 11.5–15.5)
WBC: 3.8 10*3/uL — ABNORMAL LOW (ref 4.0–10.5)

## 2016-12-06 LAB — URIC ACID: URIC ACID, SERUM: 4.1 mg/dL (ref 2.3–6.6)

## 2016-12-06 MED ORDER — HYDROCODONE-ACETAMINOPHEN 5-325 MG PO TABS
1.0000 | ORAL_TABLET | Freq: Once | ORAL | Status: AC
  Administered 2016-12-06: 1 via ORAL
  Filled 2016-12-06: qty 1

## 2016-12-06 MED ORDER — DICLOFENAC SODIUM 50 MG PO TBEC: 50.0000 mg | DELAYED_RELEASE_TABLET | Freq: Two times a day (BID) | ORAL | 0 refills | Status: DC

## 2016-12-06 MED ORDER — INDOMETHACIN 25 MG PO CAPS
25.0000 mg | ORAL_CAPSULE | Freq: Once | ORAL | Status: AC
  Administered 2016-12-06: 25 mg via ORAL
  Filled 2016-12-06: qty 1

## 2016-12-06 NOTE — ED Provider Notes (Signed)
[facility] Provider Note   CSN: [id number] Arrival date & time: 12/06/16  1504     History   Chief Complaint Chief Complaint  Patient presents with  . Foot Pain    HPI Linda Lin is a 46 y.o. female  Who presents to the ED with right foot pain. Patient reports that 3 days ago her foot started itching and then and sharp pain on the top of the foot up the ankle. Last night it felt hot after she was at work and had been standing a long time. She has a hx of cellulitis of the right foot and was afraid it was happening again. She has discoloration of the skin on both lower legs but reports that is eczema and when a dermatologist gave her Kenalog in the past it cleared up.  The history is provided by the patient. No language interpreter was used.    Past Medical History:  Diagnosis Date  . Asthma   . Cellulitis   . Scabies     Patient Active Problem List   Diagnosis Date Noted  . Contact dermatitis 12/21/2015  . Bilateral lower leg cellulitis, recurrent 12/19/2015  . Lower extremity edema 12/19/2015  . History of asthma, mild 12/19/2015  . Maculopapular rash, generalized 08/30/2015  . Tobacco abuse 08/30/2015  . Cellulitis of both lower extremities 08/27/2015    History reviewed. No pertinent surgical history.  OB History    Gravida Para Term Preterm AB Living   0 0 0 0 0 0   SAB TAB Ectopic Multiple Live Births   0 0 0 0         Home Medications    Prior to Admission medications   Medication Sig Start Date End Date Taking? Authorizing Provider  acetaminophen (TYLENOL) 500 MG tablet Take 1,500 mg by mouth every 6 (six) hours as needed.   Yes Historical Provider, MD  ibuprofen (ADVIL,MOTRIN) 400 MG tablet Take 1 tablet (400 mg total) by mouth every 6 (six) hours as needed. Patient taking differently: Take 800 mg by mouth every 6 (six) hours as needed.  05/02/16  Yes Eber Hong, MD  diclofenac (VOLTAREN) 50 MG EC tablet Take 1 tablet (50 mg total) by  mouth 2 (two) times daily. 12/06/16   Hope Orlene Och, NP    Family History No family history on file.  Social History Social History  Substance Use Topics  . Smoking status: Current Every Day Smoker    Packs/day: 0.25    Years: 20.00    Types: Cigarettes  . Smokeless tobacco: Never Used  . Alcohol use Yes     Comment: occ     Allergies   Cephalexin and Doxycycline   Review of Systems Review of Systems  Constitutional: Negative for chills and fever.  HENT: Negative for congestion, ear pain and sore throat.   Gastrointestinal: Positive for nausea. Negative for abdominal pain and vomiting.  Musculoskeletal: Positive for arthralgias.       Right lower leg pain  Skin: Positive for color change.  Neurological: Negative for syncope.  Psychiatric/Behavioral: Nervous/anxious: hx of and tx with medication.      Physical Exam Updated Vital Signs BP 140/83   Pulse 91   Temp 98 F (36.7 C) (Oral)   Resp 16   Ht 5\' 2"  (1.575 m)   Wt 54 kg   LMP 12/03/2016   SpO2 100%   BMI 21.77 kg/m   Physical Exam  Constitutional: She is oriented to person,  place, and time. She appears well-developed and well-nourished. No distress.  HENT:  Head: Normocephalic.  Eyes: EOM are normal.  Neck: Neck supple.  Cardiovascular: Normal rate.   Pulses:      Dorsalis pedis pulses are 2+ on the right side, and 2+ on the left side.  Pulmonary/Chest: Effort normal.  Musculoskeletal:       Right foot: There is tenderness. There is normal range of motion, no swelling, normal capillary refill, no crepitus, no deformity and no laceration.  Tender medial and lateral aspect with palpation.  Neurological: She is alert and oriented to person, place, and time.  Skin: No erythema.  Dry patchy areas to the lower legs c/w eczema. No erythema or signs of infection.  Psychiatric: She has a normal mood and affect. Her behavior is normal.  Nursing note and vitals reviewed.    ED Treatments / Results   Labs (all labs ordered are listed, but only abnormal results are displayed) Labs Reviewed  CBC WITH DIFFERENTIAL/PLATELET - Abnormal; Notable for the following:       Result Value   WBC 3.8 (*)    Neutro Abs 0.8 (*)    All other components within normal limits  URIC ACID    Radiology Dg Ankle Complete Right  Result Date: 12/06/2016 CLINICAL DATA:  46 y/o  F; right ankle pain. EXAM: RIGHT ANKLE - COMPLETE 3+ VIEW COMPARISON:  None. FINDINGS: There is no evidence of fracture, dislocation, or joint effusion. There is no evidence of arthropathy or other focal bone abnormality. IMPRESSION: No acute fracture or dislocation identified. Electronically Signed   By: Mitzi Hansen M.D.   On: 12/06/2016 18:23    Procedures Procedures (including critical care time) Dr. Ranae Palms in to examine the patient, will treat for inflammation and pain. Will also treat eczema. Ace wrap applied prior to d/c. Stable for d/c without focal neuro deficits and no signs of cellulitis.   Medications Ordered in ED Medications  HYDROcodone-acetaminophen (NORCO/VICODIN) 5-325 MG per tablet 1 tablet (1 tablet Oral Given 12/06/16 1756)  indomethacin (INDOCIN) capsule 25 mg (25 mg Oral Given 12/06/16 1756)     Initial Impression / Assessment and Plan / ED Course  I have reviewed the triage vital signs and the nursing notes.  Final Clinical Impressions(s) / ED Diagnoses   Final diagnoses:  Acute right ankle pain  Eczema  New Prescriptions Discharge Medication List as of 12/06/2016  7:35 PM    START taking these medications   Details  diclofenac (VOLTAREN) 50 MG EC tablet Take 1 tablet (50 mg total) by mouth 2 (two) times daily., Starting Fri 12/06/2016, Print         [place], NP 12/08/16 0127    Loren Racer, MD 12/11/16 2312

## 2016-12-06 NOTE — ED Notes (Signed)
Pt transported and returned from xray 

## 2016-12-06 NOTE — ED Triage Notes (Signed)
Pt comes in with right foot pain starting 3 days ago. Pt denies any injury. Pt states she had the same pain and irrigation when she had cellulitis several years ago. Pt doesn't have redness noted. However, pt does have a dark patch of skin noted to her inner right foot. This started 3 days ago.

## 2017-02-25 ENCOUNTER — Encounter (HOSPITAL_COMMUNITY): Payer: Self-pay | Admitting: *Deleted

## 2017-02-25 ENCOUNTER — Emergency Department (HOSPITAL_COMMUNITY): Payer: Managed Care, Other (non HMO)

## 2017-02-25 ENCOUNTER — Emergency Department (HOSPITAL_COMMUNITY)
Admission: EM | Admit: 2017-02-25 | Discharge: 2017-02-25 | Disposition: A | Discharge status: Home / Self Care | Payer: Managed Care, Other (non HMO) | Attending: Emergency Medicine | Admitting: Emergency Medicine

## 2017-02-25 DIAGNOSIS — J45909 Unspecified asthma, uncomplicated: Secondary | ICD-10-CM | POA: Diagnosis not present

## 2017-02-25 DIAGNOSIS — Z79899 Other long term (current) drug therapy: Secondary | ICD-10-CM | POA: Diagnosis not present

## 2017-02-25 DIAGNOSIS — R634 Abnormal weight loss: Secondary | ICD-10-CM | POA: Diagnosis not present

## 2017-02-25 DIAGNOSIS — R110 Nausea: Secondary | ICD-10-CM | POA: Insufficient documentation

## 2017-02-25 DIAGNOSIS — R109 Unspecified abdominal pain: Secondary | ICD-10-CM | POA: Insufficient documentation

## 2017-02-25 DIAGNOSIS — F17210 Nicotine dependence, cigarettes, uncomplicated: Secondary | ICD-10-CM | POA: Insufficient documentation

## 2017-02-25 LAB — URINALYSIS, MICROSCOPIC (REFLEX)

## 2017-02-25 LAB — URINALYSIS, ROUTINE W REFLEX MICROSCOPIC
Glucose, UA: NEGATIVE mg/dL
Ketones, ur: NEGATIVE mg/dL
Nitrite: NEGATIVE
Protein, ur: 30 mg/dL — AB
Specific Gravity, Urine: >1.03 — ABNORMAL HIGH (ref 1.005–1.030)
pH: 5.5 (ref 5.0–8.0)

## 2017-02-25 LAB — CBC WITH DIFFERENTIAL/PLATELET
Basophils Absolute: 0 10*3/uL (ref 0.0–0.1)
Basophils Relative: 0 %
EOS ABS: 0 10*3/uL (ref 0.0–0.7)
Eosinophils Relative: 1 %
HCT: 38.3 % (ref 36.0–46.0)
HEMOGLOBIN: 12.3 g/dL (ref 12.0–15.0)
LYMPHS ABS: 2.5 10*3/uL (ref 0.7–4.0)
Lymphocytes Relative: 47 %
MCH: 28.1 pg (ref 26.0–34.0)
MCHC: 32.1 g/dL (ref 30.0–36.0)
MCV: 87.6 fL (ref 78.0–100.0)
MONO ABS: 0.9 10*3/uL (ref 0.1–1.0)
MONOS PCT: 17 %
Neutro Abs: 1.8 10*3/uL (ref 1.7–7.7)
Neutrophils Relative %: 35 %
Platelets: 218 10*3/uL (ref 150–400)
RBC: 4.37 MIL/uL (ref 3.87–5.11)
RDW: 12.3 % (ref 11.5–15.5)
WBC: 5.2 10*3/uL (ref 4.0–10.5)

## 2017-02-25 LAB — COMPREHENSIVE METABOLIC PANEL
ALBUMIN: 3.8 g/dL (ref 3.5–5.0)
ALK PHOS: 126 U/L (ref 38–126)
ALT: 15 U/L (ref 14–54)
AST: 18 U/L (ref 15–41)
Anion gap: 7 (ref 5–15)
BUN: 19 mg/dL (ref 6–20)
CO2: 25 mmol/L (ref 22–32)
CREATININE: 0.46 mg/dL (ref 0.44–1.00)
Calcium: 9.5 mg/dL (ref 8.9–10.3)
Chloride: 105 mmol/L (ref 101–111)
GFR calc non Af Amer: >60 mL/min (ref >60)
GLUCOSE: 95 mg/dL (ref 65–99)
Potassium: 3.7 mmol/L (ref 3.5–5.1)
SODIUM: 137 mmol/L (ref 135–145)
Total Bilirubin: 0.4 mg/dL (ref 0.3–1.2)
Total Protein: 7.6 g/dL (ref 6.5–8.1)

## 2017-02-25 LAB — TSH: TSH: <0.01 u[IU]/mL — ABNORMAL LOW (ref 0.350–4.500)

## 2017-02-25 MED ORDER — NAPROXEN 250 MG PO TABS
250.0000 mg | ORAL_TABLET | Freq: Once | ORAL | Status: AC
  Administered 2017-02-25: 250 mg via ORAL
  Filled 2017-02-25: qty 1

## 2017-02-25 MED ORDER — NAPROXEN 250 MG PO TABS: ORAL_TABLET | ORAL | 0 refills | Status: DC

## 2017-02-25 MED ORDER — CYCLOBENZAPRINE HCL 5 MG PO TABS: 5.0000 mg | ORAL_TABLET | Freq: Three times a day (TID) | ORAL | 0 refills | Status: DC | PRN

## 2017-02-25 MED ORDER — CYCLOBENZAPRINE HCL 10 MG PO TABS
5.0000 mg | ORAL_TABLET | Freq: Once | ORAL | Status: AC
  Administered 2017-02-25: 5 mg via ORAL
  Filled 2017-02-25: qty 1

## 2017-02-25 NOTE — ED Provider Notes (Signed)
[facility] DEPT Provider Note   CSN: [id number] Arrival date & time: 02/25/17  0519  Time seen 5:55 AM   History   Chief Complaint Chief Complaint  Patient presents with  . Flank Pain    HPI Linda Lin is a 46 y.o. female.  HPI patient states she's been having right-sided flank pain off and on for the past 6-7 months. She states it normally lasts a few hours. However last night it became constant about midnight. She states movement seems to make it worse. She has had nausea without vomiting, she denies fever, dysuria, hematuria. She states she's had frequency for a few months. She's also had a cough for 8 months with clear mucus production. She states she has constant clear rhinorrhea without sneezing. She denies sore throat. She also reports significant weight loss although she states she's eating the same amount. She states 2 years ago she wore size 16/18 and now wears size 6/7. She states her hair has been following out the last 2 years and her skin is getting dry. She has intermittent episodes of having diarrhea and it last had diarrhea 2 days ago. She states she has not been trying to lose weight.  PCP none  Past Medical History:  Diagnosis Date  . Asthma   . Cellulitis   . Scabies     Patient Active Problem List   Diagnosis Date Noted  . Contact dermatitis 12/21/2015  . Bilateral lower leg cellulitis, recurrent 12/19/2015  . Lower extremity edema 12/19/2015  . History of asthma, mild 12/19/2015  . Maculopapular rash, generalized 08/30/2015  . Tobacco abuse 08/30/2015  . Cellulitis of both lower extremities 08/27/2015    History reviewed. No pertinent surgical history.  OB History    Gravida Para Term Preterm AB Living   0 0 0 0 0 0   SAB TAB Ectopic Multiple Live Births   0 0 0 0         Home Medications    Prior to Admission medications   Medication Sig Start Date End Date Taking? Authorizing Provider  acetaminophen (TYLENOL) 500 MG tablet Take  1,500 mg by mouth every 6 (six) hours as needed.    [provider]  cyclobenzaprine (FLEXERIL) 5 MG tablet Take 1 tablet (5 mg total) by mouth 3 (three) times daily as needed. 02/25/17   Devoria Albe, MD  diclofenac (VOLTAREN) 50 MG EC tablet Take 1 tablet (50 mg total) by mouth 2 (two) times daily. 12/06/16   Janne Napoleon, NP  ibuprofen (ADVIL,MOTRIN) 400 MG tablet Take 1 tablet (400 mg total) by mouth every 6 (six) hours as needed. Patient taking differently: Take 800 mg by mouth every 6 (six) hours as needed.  05/02/16   Eber Hong, MD  naproxen (NAPROSYN) 250 MG tablet Take 1 po BID with food prn pain 02/25/17   Devoria Albe, MD    Family History History reviewed. No pertinent family history.  Social History Social History  Substance Use Topics  . Smoking status: Current Every Day Smoker    Packs/day: 0.25    Years: 20.00    Types: Cigarettes  . Smokeless tobacco: Never Used  . Alcohol use Yes     Comment: occ  employed   Allergies   Cephalexin and Doxycycline   Review of Systems Review of Systems  All other systems reviewed and are negative.    Physical Exam Updated Vital Signs BP 127/81 (BP Location: Left Arm)   Pulse 100  Temp 97.9 F (36.6 C) (Oral)   Resp 18   Ht 5\' 2"  (1.575 m)   Wt 52.2 kg (115 lb)   LMP 02/24/2017   SpO2 100%   BMI 21.03 kg/m   Vital signs normal Except borderline tachycardia   Physical Exam  Constitutional: She is oriented to person, place, and time.  Non-toxic appearance. She does not appear ill. No distress.  Thin female  HENT:  Head: Normocephalic and atraumatic.  Right Ear: External ear normal.  Left Ear: External ear normal.  Nose: Nose normal. No mucosal edema or rhinorrhea.  Mouth/Throat: Oropharynx is clear and moist and mucous membranes are normal. No dental abscesses or uvula swelling.  Eyes: Conjunctivae and EOM are normal. Pupils are equal, round, and reactive to light.  Neck: Normal range of motion and full  passive range of motion without pain. Neck supple.  Cardiovascular: Normal rate, regular rhythm and normal heart sounds.  Exam reveals no gallop and no friction rub.   No murmur heard. Pulmonary/Chest: Effort normal and breath sounds normal. No respiratory distress. She has no wheezes. She has no rhonchi. She has no rales. She exhibits no tenderness and no crepitus.  Abdominal: Soft. Normal appearance and bowel sounds are normal. She exhibits no distension. There is no tenderness. There is no rebound and no guarding.  Musculoskeletal: Normal range of motion. She exhibits no edema or tenderness.  Moves all extremities well. Patient is nontender in the lumbar spine, she does have right CVA tenderness. She also has some discomfort on range of motion of the lumbar spine especially to the right.  Neurological: She is alert and oriented to person, place, and time. She has normal strength. No cranial nerve deficit.  Skin: Skin is warm, dry and intact. No rash noted. No erythema. No pallor.   skin is very dry  Psychiatric: She has a normal mood and affect. Her speech is normal and behavior is normal. Her mood appears not anxious.  Nursing note and vitals reviewed.    ED Treatments / Results  Labs (all labs ordered are listed, but only abnormal results are displayed) Results for orders placed or performed during the hospital encounter of 02/25/17  Urinalysis, Routine w reflex microscopic  Result Value Ref Range   Color, Urine AMBER (A) YELLOW   APPearance CLOUDY (A) CLEAR   Specific Gravity, Urine >1.030 (H) 1.005 - 1.030   pH 5.5 5.0 - 8.0   Glucose, UA NEGATIVE NEGATIVE mg/dL   Hgb urine dipstick MODERATE (A) NEGATIVE   Bilirubin Urine SMALL (A) NEGATIVE   Ketones, ur NEGATIVE NEGATIVE mg/dL   Protein, ur 30 (A) NEGATIVE mg/dL   Nitrite NEGATIVE NEGATIVE   Leukocytes, UA TRACE (A) NEGATIVE  Comprehensive metabolic panel  Result Value Ref Range   Sodium 137 135 - 145 mmol/L   Potassium 3.7  3.5 - 5.1 mmol/L   Chloride 105 101 - 111 mmol/L   CO2 25 22 - 32 mmol/L   Glucose, Bld 95 65 - 99 mg/dL   BUN 19 6 - 20 mg/dL   Creatinine, Ser 9.14 0.44 - 1.00 mg/dL   Calcium 9.5 8.9 - 78.2 mg/dL   Total Protein 7.6 6.5 - 8.1 g/dL   Albumin 3.8 3.5 - 5.0 g/dL   AST 18 15 - 41 U/L   ALT 15 14 - 54 U/L   Alkaline Phosphatase 126 38 - 126 U/L   Total Bilirubin 0.4 0.3 - 1.2 mg/dL   GFR calc non Af Amer >  60 >60 mL/min   GFR calc Af Amer >60 >60 mL/min   Anion gap 7 5 - 15  CBC with Differential  Result Value Ref Range   WBC 5.2 4.0 - 10.5 K/uL   RBC 4.37 3.87 - 5.11 MIL/uL   Hemoglobin 12.3 12.0 - 15.0 g/dL   HCT 02.1 11.7 - 35.6 %   MCV 87.6 78.0 - 100.0 fL   MCH 28.1 26.0 - 34.0 pg   MCHC 32.1 30.0 - 36.0 g/dL   RDW 70.1 41.0 - 30.1 %   Platelets 218 150 - 400 K/uL   Neutrophils Relative % 35 %   Neutro Abs 1.8 1.7 - 7.7 K/uL   Lymphocytes Relative 47 %   Lymphs Abs 2.5 0.7 - 4.0 K/uL   Monocytes Relative 17 %   Monocytes Absolute 0.9 0.1 - 1.0 K/uL   Eosinophils Relative 1 %   Eosinophils Absolute 0.0 0.0 - 0.7 K/uL   Basophils Relative 0 %   Basophils Absolute 0.0 0.0 - 0.1 K/uL  Urinalysis, Microscopic (reflex)  Result Value Ref Range   RBC / HPF 6-30 0 - 5 RBC/hpf   WBC, UA 0-5 0 - 5 WBC/hpf   Bacteria, UA MANY (A) NONE SEEN   Squamous Epithelial / LPF 0-5 (A) NONE SEEN   Mucous PRESENT    Trichomonas, UA PRESENT    Ca Oxalate Crys, UA PRESENT    Laboratory interpretation all normal except concentrated UA   EKG  EKG Interpretation None       Radiology Dg Chest 2 View  Result Date: 02/25/2017 CLINICAL DATA:  Intermittent productive cough for the past 8 months, occasional shortness of breath, history of smoking and weight loss. Patient also reports worsening right flank pain. EXAM: CHEST  2 VIEW COMPARISON:  Chest x-ray of May 02, 2016 FINDINGS: The lungs are mildly hyperinflated clear. The heart and pulmonary vascularity are normal. The mediastinum is  normal in width. There is no pleural effusion. There is calcification in the wall of the aortic arch. The bony thorax exhibits no acute abnormality. IMPRESSION: There is no acute cardiopulmonary abnormality. Mild hyperinflation may reflect the patient's smoking history. Thoracic aortic atherosclerosis. Electronically Signed   By: David  [place] M.D.   On: 02/25/2017 07:23   Ct Renal Stone Study  Result Date: 02/25/2017 CLINICAL DATA:  Initial evaluation for right-sided flank pain for 7 months. EXAM: CT ABDOMEN AND PELVIS WITHOUT CONTRAST TECHNIQUE: Multidetector CT imaging of the abdomen and pelvis was performed following the standard protocol without IV contrast. COMPARISON:  None. FINDINGS: Lower chest: Visualized lung bases are clear. Hepatobiliary: Limited noncontrast evaluation liver is unremarkable. Gallbladder within normal limits. No biliary dilatation. Pancreas: Pancreas within normal limits. Spleen: Scattered calcified granulomas noted within the spleen. Spleen otherwise unremarkable. Adrenals/Urinary Tract: Adrenal glands are normal. Kidneys equal in size without evidence for nephrolithiasis or hydronephrosis. Punctate 2 mm nonobstructive stone present within the interpolar right kidney. No other radiopaque calculi identified. No stones seen along the course of either renal collecting system. No hydroureter. Bladder largely decompressed without acute abnormality. No layering stones within the bladder lumen. Stomach/Bowel: Stomach within normal limits. No evidence for bowel obstruction. Appendix normal. No acute inflammatory changes about the bowels. Vascular/Lymphatic: Mild aorto bi-iliac atherosclerotic disease. No aneurysm. No appreciable adenopathy on this noncontrast examination. Reproductive: Uterus and ovaries within normal limits. Other: No free air or fluid. Fat containing paraumbilical hernia noted. Musculoskeletal: No acute osseus abnormality. No worrisome lytic or blastic osseous lesions.  IMPRESSION: 1. Punctate 2 mm nonobstructive right renal calculus. No CT evidence for obstructive uropathy. No other radiopaque calculi identified. 2. No other acute intra-abdominal or pelvic process identified. 3. Mild colonic diverticulosis without evidence for acute diverticulitis. 4. Small fat containing paraumbilical hernia. Electronically Signed   By: Rise Mu M.D.   On: 02/25/2017 07:07    Procedures Procedures (including critical care time)  Medications Ordered in ED Medications  naproxen (NAPROSYN) tablet 250 mg (250 mg Oral Given 02/25/17 0721)  cyclobenzaprine (FLEXERIL) tablet 5 mg (5 mg Oral Given 02/25/17 1610)     Initial Impression / Assessment and Plan / ED Course  I have reviewed the triage vital signs and the nursing notes.  Pertinent labs & imaging results that were available during my care of the patient were reviewed by me and considered in my medical decision making (see chart for details).   When I review patient's picture that is in the computer she has had obvious significant weight loss. TSH was done to check her for thyroid disease. CT of the kidneys was done to look for possible renal cell carcinoma with the persistent flank pain. She also is a smoker with persistent worsening cough for the past 8 months, chest x-ray was done.  Patient was given her test results which have resulted so far which is everything except for TSH. Dr. Suann Larry will check her TSH.  Final Clinical Impressions(s) / ED Diagnoses   Final diagnoses:  Right flank pain  Weight loss    New Prescriptions New Prescriptions   CYCLOBENZAPRINE (FLEXERIL) 5 MG TABLET    Take 1 tablet (5 mg total) by mouth 3 (three) times daily as needed.   NAPROXEN (NAPROSYN) 250 MG TABLET    Take 1 po BID with food prn pain    Plan discharge  Devoria Albe, MD, Concha Pyo, MD 02/25/17 [phone]

## 2017-02-25 NOTE — ED Triage Notes (Signed)
Pt c/o right side flank pain x 7 months but states the pain has been getting progressively worse; pt denies any urinary sx

## 2017-02-25 NOTE — Discharge Instructions (Signed)
Use ice and heat for comfort. Take the medications as prescribed. Please follow up with Dr Fransico Him about your thyroid test results.

## 2017-05-09 ENCOUNTER — Emergency Department (HOSPITAL_COMMUNITY)
Admission: EM | Admit: 2017-05-09 | Discharge: 2017-05-09 | Disposition: A | Discharge status: Home / Self Care | Payer: Managed Care, Other (non HMO) | Attending: Emergency Medicine | Admitting: Emergency Medicine

## 2017-05-09 ENCOUNTER — Encounter (HOSPITAL_COMMUNITY): Payer: Self-pay

## 2017-05-09 DIAGNOSIS — R109 Unspecified abdominal pain: Secondary | ICD-10-CM

## 2017-05-09 DIAGNOSIS — J45909 Unspecified asthma, uncomplicated: Secondary | ICD-10-CM | POA: Insufficient documentation

## 2017-05-09 DIAGNOSIS — F17210 Nicotine dependence, cigarettes, uncomplicated: Secondary | ICD-10-CM | POA: Diagnosis not present

## 2017-05-09 DIAGNOSIS — Z79899 Other long term (current) drug therapy: Secondary | ICD-10-CM | POA: Diagnosis not present

## 2017-05-09 DIAGNOSIS — R1011 Right upper quadrant pain: Secondary | ICD-10-CM | POA: Diagnosis present

## 2017-05-09 LAB — URINALYSIS, ROUTINE W REFLEX MICROSCOPIC
Bilirubin Urine: NEGATIVE
GLUCOSE, UA: NEGATIVE mg/dL
Hgb urine dipstick: NEGATIVE
Ketones, ur: NEGATIVE mg/dL
NITRITE: NEGATIVE
PROTEIN: 30 mg/dL — AB
SPECIFIC GRAVITY, URINE: 1.024 (ref 1.005–1.030)
pH: 5 (ref 5.0–8.0)

## 2017-05-09 LAB — POC URINE PREG, ED: PREG TEST UR: NEGATIVE

## 2017-05-09 MED ORDER — IBUPROFEN 600 MG PO TABS: 600.0000 mg | ORAL_TABLET | Freq: Three times a day (TID) | ORAL | 0 refills | Status: DC | PRN

## 2017-05-09 MED ORDER — IBUPROFEN 800 MG PO TABS
800.0000 mg | ORAL_TABLET | Freq: Once | ORAL | Status: AC
  Administered 2017-05-09: 800 mg via ORAL
  Filled 2017-05-09: qty 1

## 2017-05-09 NOTE — ED Provider Notes (Signed)
[facility] Provider Note   CSN: [id number] Arrival date & time: 05/09/17  0450     History   Chief Complaint Chief Complaint  Patient presents with  . Flank Pain    HPI Linda Lin is a 46 y.o. female.  The history is provided by the patient.  Flank Pain  This is a chronic problem. The current episode started more than 1 week ago. The problem occurs daily. The problem has been gradually worsening. Pertinent negatives include no chest pain, no abdominal pain and no shortness of breath. Nothing aggravates the symptoms. Nothing relieves the symptoms.  pt reports chronic right flank pain for 3 months It seems to happen about everyday No fever but did have some recent vomiting No cp/sob No focal weakness No abd pain No dysuria   Past Medical History:  Diagnosis Date  . Asthma   . Cellulitis   . Scabies     Patient Active Problem List   Diagnosis Date Noted  . Contact dermatitis 12/21/2015  . Bilateral lower leg cellulitis, recurrent 12/19/2015  . Lower extremity edema 12/19/2015  . History of asthma, mild 12/19/2015  . Maculopapular rash, generalized 08/30/2015  . Tobacco abuse 08/30/2015  . Cellulitis of both lower extremities 08/27/2015    History reviewed. No pertinent surgical history.  OB History    Gravida Para Term Preterm AB Living   0 0 0 0 0 0   SAB TAB Ectopic Multiple Live Births   0 0 0 0         Home Medications    Prior to Admission medications   Medication Sig Start Date End Date Taking? Authorizing Provider  ibuprofen (ADVIL,MOTRIN) 600 MG tablet Take 1 tablet (600 mg total) by mouth every 8 (eight) hours as needed for moderate pain. 05/09/17   Zadie Rhine, MD    Family History No family history on file.  Social History Social History  Substance Use Topics  . Smoking status: Current Every Day Smoker    Packs/day: 0.25    Years: 20.00    Types: Cigarettes  . Smokeless tobacco: Never Used  . Alcohol use Yes    Comment: occ     Allergies   Cephalexin and Doxycycline   Review of Systems Review of Systems  Constitutional: Negative for fever.  Respiratory: Negative for shortness of breath.   Cardiovascular: Negative for chest pain.  Gastrointestinal: Negative for abdominal pain.  Genitourinary: Positive for flank pain. Negative for dysuria.  All other systems reviewed and are negative.    Physical Exam Updated Vital Signs BP 121/74 (BP Location: Left Arm)   Pulse 94   Temp 98.1 F (36.7 C) (Oral)   Resp 16   Ht 1.6 m (5\' 3" )   Wt 50.3 kg (111 lb)   LMP 04/11/2017   SpO2 98%   BMI 19.66 kg/m   Physical Exam CONSTITUTIONAL: Well developed/well nourished HEAD: Normocephalic/atraumatic EYES: EOMI/PERRL ENMT: Mucous membranes moist NECK: supple no meningeal signs SPINE/BACK:entire spine nontender CV: S1/S2 noted, no murmurs/rubs/gallops noted LUNGS: Lungs are clear to auscultation bilaterally, no apparent distress ABDOMEN: soft, nontender, no rebound or guarding, bowel sounds noted throughout abdomen WH:QPRFF cva tenderness NEURO: Pt is awake/alert/appropriate, moves all extremitiesx4.  No facial droop.  She is ambulatory.  No focal weakness in the lower extremities. EXTREMITIES: pulses normal/equal, full ROM SKIN: warm, color normal PSYCH: no abnormalities of mood noted, alert and oriented to situation   ED Treatments / Results  Labs (all labs ordered are  listed, but only abnormal results are displayed) Labs Reviewed  URINALYSIS, ROUTINE W REFLEX MICROSCOPIC - Abnormal; Notable for the following:       Result Value   Color, Urine AMBER (*)    APPearance CLOUDY (*)    Protein, ur 30 (*)    Leukocytes, UA SMALL (*)    Bacteria, UA RARE (*)    Squamous Epithelial / LPF 6-30 (*)    All other components within normal limits  POC URINE PREG, ED    EKG  EKG Interpretation None       Radiology No results found.  Procedures Procedures (including critical care  time)  Medications Ordered in ED Medications  ibuprofen (ADVIL,MOTRIN) tablet 800 mg (800 mg Oral Given 05/09/17 7829)     Initial Impression / Assessment and Plan / ED Course  I have reviewed the triage vital signs and the nursing notes.  Pertinent labs  results that were available during my care of the patient were reviewed by me and considered in my medical decision making (see chart for details).     Pt admits this has been present for months and can't really describe that it is worsening She is sleeping on reassessment No signs of Pyelo Previous imaging has revealed kidney stones ,this could be contributing to her symptoms Otherwise, no other acute findings on her exam Will d/c home Will refer to urology due to flank pain for months We discussed strict ER return precautions   Final Clinical Impressions(s) / ED Diagnoses   Final diagnoses:  Flank pain    New Prescriptions New Prescriptions   No medications on file     Zadie Rhine, MD 05/09/17 [phone]

## 2017-05-09 NOTE — ED Triage Notes (Signed)
Pain in right flank off and on for 2 months, seen here for same previously and given pain meds and muscle relaxers.  Pt states pain has continued since then.

## 2017-06-18 NOTE — Progress Notes (Signed)
    Patient ID: Linda Lin, female    DOB: 16-Sep-1971, 46 y.o.   MRN: [id number]  No chief complaint on file.   Allergies Cephalexin and Doxycycline  Subjective:   Linda Lin is a 46 y.o. female who presents to [facility] today.  HPI HPI  Past Medical History:  Diagnosis Date  . Asthma   . Cellulitis   . Scabies     No past surgical history on file.  No family history on file.   Social History   Social History  . Marital status: Single    Spouse name: N/A  . Number of children: N/A  . Years of education: N/A   Social History Main Topics  . Smoking status: Current Every Day Smoker    Packs/day: 0.25    Years: 20.00    Types: Cigarettes  . Smokeless tobacco: Never Used  . Alcohol use Yes     Comment: occ  . Drug use: No  . Sexual activity: Not on file   Other Topics Concern  . Not on file   Social History Narrative  . No narrative on file    Review of Systems   Objective:   There were no vitals taken for this visit.  Physical Exam   Assessment and Plan   There are no diagnoses linked to this encounter.   No Follow-up on file. Luella Cook [place], [place] 06/18/2017

## 2017-06-26 ENCOUNTER — Ambulatory Visit (INDEPENDENT_AMBULATORY_CARE_PROVIDER_SITE_OTHER): Payer: 59 | Admitting: Family Medicine

## 2017-06-26 ENCOUNTER — Encounter: Payer: Self-pay | Admitting: Family Medicine

## 2017-06-26 VITALS — BP 128/84 | HR 96 | Resp 16 | Ht 63.0 in | Wt 110.0 lb

## 2017-06-26 DIAGNOSIS — E049 Nontoxic goiter, unspecified: Secondary | ICD-10-CM

## 2017-06-26 DIAGNOSIS — R5383 Other fatigue: Secondary | ICD-10-CM | POA: Diagnosis not present

## 2017-06-26 DIAGNOSIS — R634 Abnormal weight loss: Secondary | ICD-10-CM

## 2017-06-26 DIAGNOSIS — N898 Other specified noninflammatory disorders of vagina: Secondary | ICD-10-CM | POA: Diagnosis not present

## 2017-06-26 DIAGNOSIS — Z113 Encounter for screening for infections with a predominantly sexual mode of transmission: Secondary | ICD-10-CM

## 2017-06-26 DIAGNOSIS — R1310 Dysphagia, unspecified: Secondary | ICD-10-CM

## 2017-06-26 DIAGNOSIS — E0590 Thyrotoxicosis, unspecified without thyrotoxic crisis or storm: Secondary | ICD-10-CM

## 2017-06-26 NOTE — Patient Instructions (Signed)
Hyperthyroidism Hyperthyroidism is when the thyroid is too active (overactive). Your thyroid is a large gland that is located in your neck. The thyroid helps to control how your body uses food (metabolism). When your thyroid is overactive, it produces too much of a hormone called thyroxine. What are the causes? Causes of hyperthyroidism may include:  Graves disease. This is when your immune system attacks the thyroid gland. This is the most common cause.  Inflammation of the thyroid gland.  Tumor in the thyroid gland or somewhere else.  Excessive use of thyroid medicines, including: ? Prescription thyroid supplement. ? Herbal supplements that mimic thyroid hormones.  Solid or fluid-filled lumps within your thyroid gland (thyroid nodules).  Excessive ingestion of iodine.  What increases the risk?  Being female.  Having a family history of thyroid conditions. What are the signs or symptoms? Signs and symptoms of hyperthyroidism may include:  Nervousness.  Inability to tolerate heat.  Unexplained weight loss.  Diarrhea.  Change in the texture of hair or skin.  Heart skipping beats or making extra beats.  Rapid heart rate.  Loss of menstruation.  Shaky hands.  Fatigue.  Restlessness.  Increased appetite.  Sleep problems.  Enlarged thyroid gland or nodules.  How is this diagnosed? Diagnosis of hyperthyroidism may include:  Medical history and physical exam.  Blood tests.  Ultrasound tests.  How is this treated? Treatment may include:  Medicines to control your thyroid.  Surgery to remove your thyroid.  Radiation therapy.  Follow these instructions at home:  Take medicines only as directed by your health care provider.  Do not use any tobacco products, including cigarettes, chewing tobacco, or electronic cigarettes. If you need help quitting, ask your health care provider.  Do not exercise or do physical activity until your health care provider  approves.  Keep all follow-up appointments as directed by your health care provider. This is important. Contact a health care provider if:  Your symptoms do not get better with treatment.  You have fever.  You are taking thyroid replacement medicine and you: ? Have depression. ? Feel mentally and physically slow. ? Have weight gain. Get help right away if:  You have decreased alertness or a change in your awareness.  You have abdominal pain.  You feel dizzy.  You have a rapid heartbeat.  You have an irregular heartbeat. This information is not intended to replace advice given to you by your health care provider. Make sure you discuss any questions you have with your health care provider. Document Released: 09/09/2005 Document Revised: 02/08/2016 Document Reviewed: 01/25/2014 Elsevier Interactive Patient Education  2017 [employer]  

## 2017-06-26 NOTE — Progress Notes (Signed)
Patient ID: Linda Lin, female    DOB: April 17, 1971, 46 y.o.   MRN: [id number]  Chief Complaint  Patient presents with  . Establish Care    new patient    Allergies Cephalexin and Doxycycline  Subjective:   Linda Lin is a 46 y.o. female who presents to [facility] today.  HPI Here to establish care. Has had some stress in life. 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 Works now at a [employer]. Was engaged but the engagement was broken off. Has been under a lot of stress over the past couple years. Feels like getting life back in order. Has lost over 70 pounds over the past two year and has not been trying to loose weight. Reports that does not eat regularly scheduled meals. May eat two meals a day. Cooks dinner at home. Bakes foods and eats vegetables.  Has had a lot of symptoms physically that she is worried about and would like to discuss.     Patient reports that she is currently living with her sister because she lost her house and possessions when she lost her business. Reports that she feels like she is finally getting her life together and is finally able to come to the doctor and get help. Patient denies any drug or alcohol use Past Medical History:  Diagnosis Date  . Asthma   . Cellulitis   . Scabies     History reviewed. No pertinent surgical history.  Family History  Problem Relation Age of Onset  . Asthma Mother   . Diabetes Father   . Kidney failure Father   . Hypertension Sister   . Diabetes Mellitus II Brother   . Cancer Neg Hx      Social History   Social History  . Marital status: Single    Spouse name: N/A  . Number of children: N/A  . Years of education: N/A   Social History Main Topics  . Smoking status: Current Every Day Smoker    Packs/day: 0.25    Years: 20.00    Types: Cigarettes  . Smokeless tobacco: Never Used  . Alcohol use Yes     Comment: occ  . Drug use: Yes    Types: Marijuana       Comment: sporadically  . Sexual activity: Not Currently    Partners: Male    Birth control/ protection: None   Other Topics Concern  . None   Social History Narrative  . None    Review of Systems  Constitutional: Positive for activity change, appetite change, fatigue and unexpected weight change. Negative for fever.       Appetite comes and goes. Does not always feel like eating. Gets night sweats from time to time.   HENT: Positive for rhinorrhea and trouble swallowing. Negative for dental problem, mouth sores and sinus pressure.        Feels like has trouble swallowing and the food gets caught and sometimes has to make herself vomit b/c the food will not go down. Has been going on for a year.   Eyes: Negative for pain, redness and visual disturbance.  Respiratory: Positive for cough. Negative for apnea, chest tightness and shortness of breath.        Has had a cough on and off for about a year. Cough is productive of sputum, clear, but sometimes might not produce sputum. Does not feel SOB. Occurs throughout the day.  Cardiovascular: Negative for chest pain, palpitations and leg swelling.  Gastrointestinal: Positive for nausea. Negative for abdominal distention, abdominal pain, anal bleeding, blood in stool, constipation, diarrhea and vomiting.  Endocrine: Positive for cold intolerance, heat intolerance and polyuria. Negative for polydipsia and polyphagia.       Urinary frequency may be on and off.   Genitourinary: Positive for vaginal discharge. Negative for difficulty urinating, dyspareunia, dysuria, flank pain, frequency, genital sores, hematuria, menstrual problem and pelvic pain.       Menses monthly, last for 3-5 days, regular, not heavy. Treated for  Herpes. Has some vaginal discharge now that has been there on and off for a year. Report that it comes and goes. Sexually active but does not use condoms.   Musculoskeletal: Negative for gait problem and neck stiffness.  Skin:  Negative for color change and rash.  Neurological: Positive for dizziness, tremors, light-headedness and numbness. Negative for seizures, syncope, facial asymmetry, speech difficulty and headaches.       Feels shakey and others would notice. Sometimes would have the tremors for about months at a time and would get better. Not associated with eating or food. Reports that occur at rest. Feels like it is whole body.   Can get tingling in fingers at times, they might feel numb. All fingers bilaterally.   Can feel light headed when stressed.   Hematological: Negative for adenopathy. Bruises/bleeds easily.       Bruises easily per report but no bruises on abdomen  Psychiatric/Behavioral: Positive for dysphoric mood. Negative for agitation, behavioral problems, confusion, hallucinations, sleep disturbance and suicidal ideas. The patient is nervous/anxious.      Objective:   BP 128/84   Pulse 96   Resp 16   Ht 5\' 3"  (1.6 m)   Wt 110 lb (49.9 kg)   SpO2 96%   BMI 19.49 kg/m   Physical Exam  Constitutional: She appears well-developed and well-nourished. No distress.  HENT:  Head: Normocephalic and atraumatic. Head is without right periorbital erythema and without left periorbital erythema.  Right Ear: Tympanic membrane, external ear and ear canal normal.  Left Ear: Tympanic membrane, external ear and ear canal normal.  Nose: Nose normal.  Mouth/Throat: Oropharynx is clear and moist. No oropharyngeal exudate.  Eyes: Pupils are equal, round, and reactive to light. Conjunctivae and EOM are normal. Right eye exhibits no discharge. Left eye exhibits no discharge. No scleral icterus.  No exopthalmos   Neck: Trachea normal, normal range of motion, full passive range of motion without pain and phonation normal. Neck supple. No JVD present. No muscular tenderness present. No neck rigidity. No tracheal deviation and no erythema present. Thyromegaly present. No thyroid mass present.  Thyroid diffusely  enlarged with right side greater than left. No discrete masses.   Cardiovascular: Normal rate, regular rhythm, S1 normal, S2 normal and normal heart sounds.   No extrasystoles are present.  Pulmonary/Chest: Effort normal and breath sounds normal. No accessory muscle usage or stridor. No tachypnea. No respiratory distress.  Abdominal: Soft. Bowel sounds are normal. She exhibits no distension and no ascites. There is no hepatosplenomegaly. There is no tenderness. There is no guarding.  Lymphadenopathy:       Head (right side): No submental and no submandibular adenopathy present.       Head (left side): No submental and no submandibular adenopathy present.    She has no cervical adenopathy.       Right: No inguinal and no supraclavicular adenopathy present.  Left: No inguinal and no supraclavicular adenopathy present.  Neurological: She is alert. She has normal strength. She displays no atrophy, no tremor and normal reflexes. No cranial nerve deficit or sensory deficit. Gait normal.  Reflex Scores:      Tricep reflexes are 2+ on the right side and 2+ on the left side.      Bicep reflexes are 2+ on the right side and 2+ on the left side.      Patellar reflexes are 2+ on the right side and 2+ on the left side. Skin: Skin is warm, dry and intact. No rash noted.  Psychiatric: She has a normal mood and affect. Her speech is normal and behavior is normal. Judgment and thought content normal. Cognition and memory are normal.  Nursing note and vitals reviewed.    Assessment and Plan   1. Weight loss, greater than 70 pounds over the past 1-2 years Suspect secondary to overactive thyroid however will check other labs. Patient to increase caloric intake and keep dietary record. Patient encouraged to eat regularly scheduled meals and snacks. - CBC with Differential/Platelet - COMPLETE METABOLIC PANEL WITH GFR  2. Vaginal discharge Chronic, but at risk for sexually transmitted infection at this  time will check for STDs Patient encouraged to use condoms and safe sexual practices Defers Pap at this time but will do complete physical exam and follow-up - Urinalysis, Routine w reflex microscopic - Urine cytology ancillary only  3. Thyroid enlargement Thyroid diffusely enlarged with right side greater than the left suspect cause of hyperthyroidism. However no discrete masses palpated check ultrasound at this time - US THYROID; Future  4. Hyperthyroidism Check thyroid panel patient defers beta blocker at this time and heart rate was within normal limits. Counseled concerning worrisome signs and symptoms of hyperthyroidism. Patient defers anxiety medication at this time suspect that episodic anxiety is related to thyroid however has had stressful life experiences. Will await lab results and discuss at next visit. - Thyroid Panel With TSH  5. Dysphagia, unspecified type Discussed with patient whether dysphagia could be related to pressure of thyroid versus issue with her esophagus. However, suspect that if related to thyroid would the more consistent and daily dysphasia rather than episodic. Refer to GI for evaluation and probable upper endoscopy. - Ambulatory referral to Gastroenterology  6. Screen for STD (sexually transmitted disease) Safe sex practices and condom use recommended check hepatitis panel at follow-up. - HIV antibody - RPR  7. Fatigue, unspecified type Patient reports fatigue and low energy, which is not exactly consistent with hyperthyroidism. Fatigue could be related to life stressors versus metabolic disorder. Check labs and discuss at follow-up. - COMPLETE METABOLIC PANEL WITH GFR  Patient told to call with any questions or concerns. She was encouraged to get the ultrasound and see the specialists. Patient told that if anxiety worsens, becomes more frequent, or develops any mood changes to please call or return to office. Patient patient voiced understanding. Office  visit was greater than 60 minutes.  Patient encouraged to stop smoking. She is not ready to quit at this time counseled concerning the risks of smoking. Patient voiced understanding Return in about 3 weeks (around 07/17/2017). Aliene Beams, MD 06/26/2017

## 2017-06-27 ENCOUNTER — Telehealth: Payer: Self-pay

## 2017-06-27 NOTE — Telephone Encounter (Signed)
Orders pended ,

## 2017-06-30 ENCOUNTER — Encounter: Payer: Self-pay | Admitting: Internal Medicine

## 2017-07-01 ENCOUNTER — Telehealth: Payer: Self-pay | Admitting: Family Medicine

## 2017-07-01 NOTE — Telephone Encounter (Signed)
Patient advised of labs. TSH results requested. Patient aware of [place] appt

## 2017-07-01 NOTE — Telephone Encounter (Signed)
Please call patient and advise that she is hyperthyroid. When is she scheduled for the ultrasound. I would like for her to get that as soon as she can. In addition, I am going to do a referral for her to see Dr. Durward Parcel. I was supposed to get a TSH back with her labs and I did not see that, can you please contact the lab and find out if we will be receiving it. Advise that her HIV test was negative and blood counts look ok. We will discuss them all at her follow up.  Janine Limbo. Tracie Harrier, MD

## 2017-07-02 LAB — CBC WITH DIFFERENTIAL/PLATELET
Basophils Absolute: 9 cells/uL (ref 0–200)
Basophils Relative: 0.2 %
EOS PCT: 0.7 %
Eosinophils Absolute: 30 cells/uL (ref 15–500)
HCT: 33.4 % — ABNORMAL LOW (ref 35.0–45.0)
Hemoglobin: 11.1 g/dL — ABNORMAL LOW (ref 11.7–15.5)
Lymphs Abs: 2464 cells/uL (ref 850–3900)
MCH: 28.2 pg (ref 27.0–33.0)
MCHC: 33.2 g/dL (ref 32.0–36.0)
MCV: 84.8 fL (ref 80.0–100.0)
MPV: 9.7 fL (ref 7.5–12.5)
Monocytes Relative: 16.9 %
Neutro Abs: 1071 cells/uL — ABNORMAL LOW (ref 1500–7800)
Neutrophils Relative %: 24.9 %
PLATELETS: 214 10*3/uL (ref 140–400)
RBC: 3.94 10*6/uL (ref 3.80–5.10)
RDW: 11.9 % (ref 11.0–15.0)
TOTAL LYMPHOCYTE: 57.3 %
WBC mixed population: 727 cells/uL (ref 200–950)
WBC: 4.3 10*3/uL (ref 3.8–10.8)

## 2017-07-02 LAB — URINALYSIS, ROUTINE W REFLEX MICROSCOPIC
BILIRUBIN URINE: NEGATIVE
GLUCOSE, UA: NEGATIVE
HGB URINE DIPSTICK: NEGATIVE
KETONES UR: NEGATIVE
Leukocytes, UA: NEGATIVE
Nitrite: NEGATIVE
PROTEIN: NEGATIVE
Specific Gravity, Urine: 1.027 (ref 1.001–1.03)
pH: 6 (ref 5.0–8.0)

## 2017-07-02 LAB — FLUORESCENT TREPONEMAL AB(FTA)-IGG-BLD: Fluorescent Treponemal ABS: NONREACTIVE

## 2017-07-02 LAB — COMPLETE METABOLIC PANEL WITH GFR
AG RATIO: 1.2 (calc) (ref 1.0–2.5)
ALT: 17 U/L (ref 6–29)
AST: 18 U/L (ref 10–35)
Albumin: 4 g/dL (ref 3.6–5.1)
Alkaline phosphatase (APISO): 151 U/L — ABNORMAL HIGH (ref 33–115)
BILIRUBIN TOTAL: 0.4 mg/dL (ref 0.2–1.2)
BUN/Creatinine Ratio: 48 (calc) — ABNORMAL HIGH (ref 6–22)
BUN: 16 mg/dL (ref 7–25)
CHLORIDE: 106 mmol/L (ref 98–110)
CO2: 26 mmol/L (ref 20–32)
Calcium: 9.4 mg/dL (ref 8.6–10.2)
Creat: 0.33 mg/dL — ABNORMAL LOW (ref 0.50–1.10)
GFR, EST AFRICAN AMERICAN: 153 mL/min/{1.73_m2} (ref >=60)
GFR, Est Non African American: 132 mL/min/{1.73_m2} (ref >=60)
Globulin: 3.3 g/dL (calc) (ref 1.9–3.7)
Glucose, Bld: 74 mg/dL (ref 65–99)
POTASSIUM: 3.7 mmol/L (ref 3.5–5.3)
SODIUM: 139 mmol/L (ref 135–146)
TOTAL PROTEIN: 7.3 g/dL (ref 6.1–8.1)

## 2017-07-02 LAB — THYROID PANEL WITH TSH
FREE THYROXINE INDEX: 12.8 — AB (ref 1.4–3.8)
T3 Uptake: 40 % — ABNORMAL HIGH (ref 22–35)
T4 TOTAL: 31.9 ug/dL — AB (ref 5.1–11.9)
TSH: <0.01 mIU/L — ABNORMAL LOW

## 2017-07-02 LAB — RPR: RPR Ser Ql: REACTIVE — AB

## 2017-07-02 LAB — HIV ANTIBODY (ROUTINE TESTING W REFLEX): HIV: NONREACTIVE

## 2017-07-02 LAB — RPR TITER

## 2017-07-02 NOTE — Progress Notes (Signed)
Pt aware, to come in @ 0800 Friday.

## 2017-07-03 ENCOUNTER — Telehealth: Payer: Self-pay | Admitting: Family Medicine

## 2017-07-03 NOTE — Telephone Encounter (Signed)
Called patient to discuss her syphilis testing explained the testing and the positive RPR but the negative treponemal Ab testing. She was told that she did not need to come in to discuss with me in the morning. I told her that she should keep her follow up with me for 10/25 and that we were waiting on additional tests from her initial visit. She has her thyroid u/s scheduled for tomorrow. Linda Lin. Tracie Harrier, MD

## 2017-07-04 ENCOUNTER — Telehealth: Payer: Self-pay | Admitting: Family Medicine

## 2017-07-04 ENCOUNTER — Ambulatory Visit (HOSPITAL_COMMUNITY)
Admission: RE | Admit: 2017-07-04 | Discharge: 2017-07-04 | Disposition: A | Discharge status: Home / Self Care | Payer: 59 | Source: Ambulatory Visit | Attending: Family Medicine | Admitting: Family Medicine

## 2017-07-04 ENCOUNTER — Ambulatory Visit: Payer: Managed Care, Other (non HMO) | Admitting: Family Medicine

## 2017-07-04 DIAGNOSIS — E0590 Thyrotoxicosis, unspecified without thyrotoxic crisis or storm: Secondary | ICD-10-CM

## 2017-07-04 DIAGNOSIS — E049 Nontoxic goiter, unspecified: Secondary | ICD-10-CM

## 2017-07-04 NOTE — Telephone Encounter (Signed)
I emailed Soledad Gerlach at Dr Isidoro Donning office to work this pt in asap.  I expect to here back from her on Monday with a date and time.

## 2017-07-04 NOTE — Telephone Encounter (Signed)
Please call and tell patient that her thyroid is enlarged but no discreet nodules. The whole gland is enlarged. I have done a referral for he to see Dr. Fransico Him, Endocrine. She should get a message about the appointment. Please keep it. Very important. Linda Lin. Tracie Harrier, MD

## 2017-07-07 ENCOUNTER — Telehealth: Payer: Self-pay | Admitting: Family Medicine

## 2017-07-07 NOTE — Telephone Encounter (Signed)
Error

## 2017-07-11 ENCOUNTER — Emergency Department (HOSPITAL_COMMUNITY)
Admission: EM | Admit: 2017-07-11 | Discharge: 2017-07-11 | Disposition: A | Discharge status: Home / Self Care | Payer: 59 | Attending: Emergency Medicine | Admitting: Emergency Medicine

## 2017-07-11 ENCOUNTER — Emergency Department (HOSPITAL_COMMUNITY): Payer: 59

## 2017-07-11 ENCOUNTER — Encounter (HOSPITAL_COMMUNITY): Payer: Self-pay | Admitting: Emergency Medicine

## 2017-07-11 DIAGNOSIS — J45909 Unspecified asthma, uncomplicated: Secondary | ICD-10-CM | POA: Insufficient documentation

## 2017-07-11 DIAGNOSIS — F17210 Nicotine dependence, cigarettes, uncomplicated: Secondary | ICD-10-CM | POA: Insufficient documentation

## 2017-07-11 DIAGNOSIS — R200 Anesthesia of skin: Secondary | ICD-10-CM | POA: Diagnosis not present

## 2017-07-11 DIAGNOSIS — Z79899 Other long term (current) drug therapy: Secondary | ICD-10-CM | POA: Insufficient documentation

## 2017-07-11 DIAGNOSIS — H9319 Tinnitus, unspecified ear: Secondary | ICD-10-CM | POA: Insufficient documentation

## 2017-07-11 DIAGNOSIS — R42 Dizziness and giddiness: Secondary | ICD-10-CM | POA: Insufficient documentation

## 2017-07-11 DIAGNOSIS — R51 Headache: Secondary | ICD-10-CM | POA: Diagnosis not present

## 2017-07-11 LAB — BASIC METABOLIC PANEL
ANION GAP: 7 (ref 5–15)
BUN: 16 mg/dL (ref 6–20)
CALCIUM: 9.5 mg/dL (ref 8.9–10.3)
CHLORIDE: 108 mmol/L (ref 101–111)
CO2: 23 mmol/L (ref 22–32)
CREATININE: 0.37 mg/dL — AB (ref 0.44–1.00)
GFR calc non Af Amer: >60 mL/min (ref >60)
GLUCOSE: 102 mg/dL — AB (ref 65–99)
Potassium: 3.9 mmol/L (ref 3.5–5.1)
Sodium: 138 mmol/L (ref 135–145)

## 2017-07-11 LAB — CBC WITH DIFFERENTIAL/PLATELET
BASOS ABS: 0 10*3/uL (ref 0.0–0.1)
BASOS PCT: 0 %
EOS ABS: 0 10*3/uL (ref 0.0–0.7)
Eosinophils Relative: 1 %
HCT: 36.9 % (ref 36.0–46.0)
Hemoglobin: 11.8 g/dL — ABNORMAL LOW (ref 12.0–15.0)
LYMPHS ABS: 2.4 10*3/uL (ref 0.7–4.0)
Lymphocytes Relative: 63 %
MCH: 28.6 pg (ref 26.0–34.0)
MCHC: 32 g/dL (ref 30.0–36.0)
MCV: 89.6 fL (ref 78.0–100.0)
MONO ABS: 0.6 10*3/uL (ref 0.1–1.0)
MONOS PCT: 15 %
NEUTROS ABS: 0.8 10*3/uL — AB (ref 1.7–7.7)
NEUTROS PCT: 21 %
PLATELETS: 195 10*3/uL (ref 150–400)
RBC: 4.12 MIL/uL (ref 3.87–5.11)
RDW: 13.2 % (ref 11.5–15.5)
WBC: 3.8 10*3/uL — ABNORMAL LOW (ref 4.0–10.5)

## 2017-07-11 MED ORDER — MECLIZINE HCL 12.5 MG PO TABS
25.0000 mg | ORAL_TABLET | Freq: Once | ORAL | Status: AC
  Administered 2017-07-11: 25 mg via ORAL
  Filled 2017-07-11: qty 2

## 2017-07-11 MED ORDER — METOCLOPRAMIDE HCL 5 MG/ML IJ SOLN
5.0000 mg | Freq: Once | INTRAMUSCULAR | Status: AC
  Administered 2017-07-11: 5 mg via INTRAVENOUS
  Filled 2017-07-11: qty 2

## 2017-07-11 MED ORDER — MECLIZINE HCL 25 MG PO TABS: 25.0000 mg | ORAL_TABLET | Freq: Three times a day (TID) | ORAL | 0 refills | Status: DC | PRN

## 2017-07-11 NOTE — ED Notes (Signed)
EKG given to Dr Felix Pacini.

## 2017-07-11 NOTE — ED Notes (Signed)
Pt ambulatory to bathroom without difficulty.  States her dizziness is better.

## 2017-07-11 NOTE — ED Provider Notes (Signed)
[facility]ENCY DEPARTMENT Provider Note   CSN: [id number] Arrival date & time: 07/11/17  0350     History   Chief Complaint Chief Complaint  Patient presents with  . Dizziness    HPI Linda Lodgeerri L Lin is a 46 y.o. female.  HPI Complete the bitemporal headache onset 2 days ago accompanied by dizziness meaning vertigo and sensation of room spinning and left sided tinnitus. She also complains of tingling in index and middle fingers of both hands for approximately 2 days. No visual changes no nausea or vomiting no fever no trauma. Treated with Tylenol with transient relief. Nothing makes symptoms better or worse. No other associated symptoms Past Medical History:  Diagnosis Date  . Asthma   . Cellulitis   . Scabies     Patient Active Problem List   Diagnosis Date Noted  . Contact dermatitis 12/21/2015  . Bilateral lower leg cellulitis, recurrent 12/19/2015  . Lower extremity edema 12/19/2015  . History of asthma, mild 12/19/2015  . Maculopapular rash, generalized 08/30/2015  . Tobacco abuse 08/30/2015  . Cellulitis of both lower extremities 08/27/2015    History reviewed. No pertinent surgical history.  OB History    Gravida Para Term Preterm AB Living   0 0 0 0 0 0   SAB TAB Ectopic Multiple Live Births   0 0 0 0         Home Medications    Prior to Admission medications   Medication Sig Start Date End Date Taking? Authorizing Provider  ibuprofen (ADVIL,MOTRIN) 600 MG tablet Take 1 tablet (600 mg total) by mouth every 8 (eight) hours as needed for moderate pain. 05/09/17   Zadie RhineWickline, Donald, MD    Family History Family History  Problem Relation Age of Onset  . Asthma Mother   . Diabetes Father   . Kidney failure Father   . Hypertension Sister   . Diabetes Mellitus II Brother   . Cancer Neg Hx     Social History Social History  Substance Use Topics  . Smoking status: Current Every Day Smoker    Packs/day: 0.25    Years: 20.00    Types:  Cigarettes  . Smokeless tobacco: Never Used  . Alcohol use Yes     Comment: occ     Allergies   Cephalexin and Doxycycline   Review of Systems Review of Systems  Constitutional: Negative.   HENT: Positive for tinnitus.   Respiratory: Negative.   Cardiovascular: Negative.   Gastrointestinal: Negative.   Musculoskeletal: Negative.   Skin: Negative.   Neurological: Positive for dizziness and headaches.       Tingling and fingers  Psychiatric/Behavioral: Negative.   All other systems reviewed and are negative.    Physical Exam Updated Vital Signs BP 133/82 (BP Location: Right Arm)   Pulse 93   Temp 98.4 F (36.9 C) (Oral)   Resp 18   Ht 5\' 3"  (1.6 m)   Wt 52.2 kg (115 lb)   LMP 07/07/2017   SpO2 100%   BMI 20.37 kg/m   Physical Exam  Constitutional: She is oriented to person, place, and time. She appears well-developed and well-nourished.  HENT:  Head: Normocephalic and atraumatic.  Right Ear: External ear normal.  Left Ear: External ear normal.  Fundi not well visualized. Bilateral tympanic membranes normal  Eyes: Pupils are equal, round, and reactive to light. Conjunctivae and EOM are normal.  Neck: Neck supple. No tracheal deviation present. No thyromegaly present.  No bruit  Cardiovascular:  Normal rate and regular rhythm.   No murmur heard. Pulmonary/Chest: Effort normal and breath sounds normal.  Abdominal: Soft. Bowel sounds are normal. She exhibits no distension. There is no tenderness.  Musculoskeletal: Normal range of motion. She exhibits no edema or tenderness.  Neurological: She is alert and oriented to person, place, and time. Coordination normal.  Gait minimally unsteady,. becomes vertiginous upon walking. Finger to nose normal DTR symmetric bilaterally at knee jerk ankle jerk biceps toes or going bilaterally. Romberg normal pronator drift normal  Skin: Skin is warm and dry. No rash noted.  Psychiatric: She has a normal mood and affect.  Nursing  note and vitals reviewed.    ED Treatments / Results  Labs (all labs ordered are listed, but only abnormal results are displayed) Labs Reviewed  BASIC METABOLIC PANEL  CBC WITH DIFFERENTIAL/PLATELET    EKG  EKG Interpretation  Date/Time:  Friday July 11 2017 07:04:14 EDT Ventricular Rate:  78 PR Interval:  118 QRS Duration: 78 QT Interval:  370 QTC Calculation: 421 R Axis:   86 Text Interpretation:  Sinus rhythm with marked sinus arrhythmia Minimal voltage criteria for LVH, may be normal variant Borderline ECG Since last tracing rate slower Confirmed by Doug Sou [phone]) on 07/11/2017 7:13:40 AM       Radiology No results found.  Procedures Procedures (including critical care time)  Medications Ordered in ED Medications - No data to display  Results for orders placed or performed during the hospital encounter of 07/11/17  Basic metabolic panel  Result Value Ref Range   Sodium 138 135 - 145 mmol/L   Potassium 3.9 3.5 - 5.1 mmol/L   Chloride 108 101 - 111 mmol/L   CO2 23 22 - 32 mmol/L   Glucose, Bld 102 (H) 65 - 99 mg/dL   BUN 16 6 - 20 mg/dL   Creatinine, Ser 2.42 (L) 0.44 - 1.00 mg/dL   Calcium 9.5 8.9 - 68.3 mg/dL   GFR calc non Af Amer >60 >60 mL/min   GFR calc Af Amer >60 >60 mL/min   Anion gap 7 5 - 15  CBC with Differential/Platelet  Result Value Ref Range   WBC 3.8 (L) 4.0 - 10.5 K/uL   RBC 4.12 3.87 - 5.11 MIL/uL   Hemoglobin 11.8 (L) 12.0 - 15.0 g/dL   HCT 41.9 62.2 - 29.7 %   MCV 89.6 78.0 - 100.0 fL   MCH 28.6 26.0 - 34.0 pg   MCHC 32.0 30.0 - 36.0 g/dL   RDW 98.9 21.1 - 94.1 %   Platelets 195 150 - 400 K/uL   Neutrophils Relative % 21 %   Neutro Abs 0.8 (L) 1.7 - 7.7 K/uL   Lymphocytes Relative 63 %   Lymphs Abs 2.4 0.7 - 4.0 K/uL   Monocytes Relative 15 %   Monocytes Absolute 0.6 0.1 - 1.0 K/uL   Eosinophils Relative 1 %   Eosinophils Absolute 0.0 0.0 - 0.7 K/uL   Basophils Relative 0 %   Basophils Absolute 0.0 0.0 - 0.1 K/uL    Mr Brain Wo Contrast  Result Date: 07/11/2017 CLINICAL DATA:  Persistent central vertigo. EXAM: MRI HEAD WITHOUT CONTRAST TECHNIQUE: Multiplanar, multiecho pulse sequences of the brain and surrounding structures were obtained without intravenous contrast. COMPARISON:  None. FINDINGS: Brain: Negative for acute infarct, hemorrhage, or hydrocephalus. There is prominent FLAIR hyperintensity symmetrically in the pons with few hyperintensities in the cerebral white matter. No associated expansion, restricted diffusion, or hemorrhage. This appearance is often  seen with chronic small vessel ischemia, although this would be notable disproportionate involvement of the pons and notable for age. There is no hyponatremia, medications/ elicit drug history, or hypertension for an osmotic demyelination, toxic demyelination, or pontine variant posterior reversible encephalopathy syndrome. Without expansion, an infiltrating neoplasm is considered unlikely. Reportedly there is no infectious symptoms. Normal brain volume. Case discussed with Dr. Ethelda Chick.  Anticipate neurology follow-up. Vascular: Major flow voids are preserved. Skull and upper cervical spine: Negative for marrow lesion. Sinuses/Orbits: Negative IMPRESSION: 1. No acute infarct. 2. Prominent signal abnormality in the pons with differential considerations described above. Electronically Signed   By: Marnee Spring M.D.   On: 07/11/2017 10:35   US Thyroid  Result Date: 07/04/2017 CLINICAL DATA:  Enlargement,  hyperthyroidism EXAM: THYROID ULTRASOUND TECHNIQUE: Ultrasound examination of the thyroid gland and adjacent soft tissues was performed. COMPARISON:  None. FINDINGS: Parenchymal Echotexture: Moderately heterogenous, markedly hyperemic. Isthmus: 1.1 cm thickness Right lobe: 5.5 x 2.5 x 2.9 cm Left lobe: 5.2 x 2.4 x 2.5 cm _________________________________________________________ Estimated total number of nodules >/= 1 cm: 0 Number of spongiform nodules  >/=  2 cm not described below (TR1): 0 Number of mixed cystic and solid nodules >/= 1.5 cm not described below (TR2): 0 _________________________________________________________ No discrete nodules are seen within the thyroid gland. IMPRESSION: 1. Enlarged, heterogeneous, hyperemic thyroid.  No nodule. The above is in keeping with the ACR TI-RADS recommendations - J Am Coll Radiol 2017;14:587-595. Electronically Signed   By: Corlis Leak M.D.   On: 07/04/2017 14:49   Initial Impression / Assessment and Plan / ED Course  I have reviewed the triage vital signs and the nursing notes.  Pertinent labs & imaging results that were available during my care of the patient were reviewed by me and considered in my medical decision making (see chart for details).     11:10 AM headache is improved after treatment with intravenous Reglan and oral meclizine. Vertigo has resolved. Numbness in fingertips have resolved. Discussed her MRI scan with radiologist to suggest referral to neurology. I will refer her to [facility] as well as [facility] clinic. I attempted to call her primary care physician Dr. Tracie Harrier at 11:15 AM, no answer. Prescription meclizine  Final Clinical Impressions(s) / ED Diagnoses  Diagnosis #1 vertigo #2 bad headache Final diagnoses:  None    New Prescriptions New Prescriptions   No medications on file     Doug Sou, MD 07/11/17 1121

## 2017-07-11 NOTE — ED Triage Notes (Signed)
Pt C/O headache, dizziness, and weakness that started about 2 days ago. Pt also reports tingling in her finger.

## 2017-07-11 NOTE — Discharge Instructions (Signed)
Call [facility] office or [facility] to schedule the next available appointment. Tell office staff that you had an MRI performed here. You can take the copy of your MRI scan with you. Doctor's office. Take the medication prescribed as needed for dizziness. Return if concern for any reason

## 2017-07-17 ENCOUNTER — Ambulatory Visit: Payer: Managed Care, Other (non HMO) | Admitting: Family Medicine

## 2017-07-17 ENCOUNTER — Ambulatory Visit (INDEPENDENT_AMBULATORY_CARE_PROVIDER_SITE_OTHER): Payer: 59 | Admitting: Family Medicine

## 2017-07-17 ENCOUNTER — Encounter: Payer: Self-pay | Admitting: Family Medicine

## 2017-07-17 VITALS — BP 126/58 | HR 104 | Temp 99.1°F | Resp 16 | Ht 63.0 in | Wt 112.8 lb

## 2017-07-17 DIAGNOSIS — D649 Anemia, unspecified: Secondary | ICD-10-CM

## 2017-07-17 DIAGNOSIS — R9089 Other abnormal findings on diagnostic imaging of central nervous system: Secondary | ICD-10-CM

## 2017-07-17 DIAGNOSIS — E0590 Thyrotoxicosis, unspecified without thyrotoxic crisis or storm: Secondary | ICD-10-CM | POA: Diagnosis not present

## 2017-07-17 MED ORDER — PROPRANOLOL HCL 40 MG PO TABS: 40.0000 mg | ORAL_TABLET | Freq: Three times a day (TID) | ORAL | 0 refills | Status: DC

## 2017-07-17 MED ORDER — PROPYLTHIOURACIL 50 MG PO TABS: 100.0000 mg | ORAL_TABLET | Freq: Three times a day (TID) | ORAL | 0 refills | Status: DC

## 2017-07-17 MED ORDER — PROPRANOLOL HCL 20 MG PO TABS: 20.0000 mg | ORAL_TABLET | Freq: Three times a day (TID) | ORAL | 1 refills | Status: DC

## 2017-07-17 NOTE — Patient Instructions (Signed)
Hyperthyroidism Hyperthyroidism is when the thyroid is too active (overactive). Your thyroid is a large gland that is located in your neck. The thyroid helps to control how your body uses food (metabolism). When your thyroid is overactive, it produces too much of a hormone called thyroxine. What are the causes? Causes of hyperthyroidism may include:  Graves disease. This is when your immune system attacks the thyroid gland. This is the most common cause.  Inflammation of the thyroid gland.  Tumor in the thyroid gland or somewhere else.  Excessive use of thyroid medicines, including: ? Prescription thyroid supplement. ? Herbal supplements that mimic thyroid hormones.  Solid or fluid-filled lumps within your thyroid gland (thyroid nodules).  Excessive ingestion of iodine.  What increases the risk?  Being female.  Having a family history of thyroid conditions. What are the signs or symptoms? Signs and symptoms of hyperthyroidism may include:  Nervousness.  Inability to tolerate heat.  Unexplained weight loss.  Diarrhea.  Change in the texture of hair or skin.  Heart skipping beats or making extra beats.  Rapid heart rate.  Loss of menstruation.  Shaky hands.  Fatigue.  Restlessness.  Increased appetite.  Sleep problems.  Enlarged thyroid gland or nodules.  How is this diagnosed? Diagnosis of hyperthyroidism may include:  Medical history and physical exam.  Blood tests.  Ultrasound tests.  How is this treated? Treatment may include:  Medicines to control your thyroid.  Surgery to remove your thyroid.  Radiation therapy.  Follow these instructions at home:  Take medicines only as directed by your health care provider.  Do not use any tobacco products, including cigarettes, chewing tobacco, or electronic cigarettes. If you need help quitting, ask your health care provider.  Do not exercise or do physical activity until your health care provider  approves.  Keep all follow-up appointments as directed by your health care provider. This is important. Contact a health care provider if:  Your symptoms do not get better with treatment.  You have fever.  You are taking thyroid replacement medicine and you: ? Have depression. ? Feel mentally and physically slow. ? Have weight gain. Get help right away if:  You have decreased alertness or a change in your awareness.  You have abdominal pain.  You feel dizzy.  You have a rapid heartbeat.  You have an irregular heartbeat. This information is not intended to replace advice given to you by your health care provider. Make sure you discuss any questions you have with your health care provider. Document Released: 09/09/2005 Document Revised: 02/08/2016 Document Reviewed: 01/25/2014 Elsevier Interactive Patient Education  2017 [employer]  

## 2017-07-17 NOTE — Progress Notes (Signed)
Patient ID: Linda Lodgeerri L Gockley, female    DOB: 21-Sep-1971, 46 y.o.   MRN: [id number]  Chief Complaint  Patient presents with  . Follow-up  . Weight Loss    Allergies Cephalexin and Doxycycline  Subjective:   Linda Lin is a 46 y.o. female who [facility].  HPI Patient here for her follow-up office visit. Reports that since she was here she did go to the emergency department because she felt like her heart was racing. In addition at that time she felt somewhat dizzy with her symptoms. An MRI was performed in the emergency department which did reveal a questionable abnormality in the pons of her brain. She reports she was given hydroxyzine for the dizzy symptoms but it really hasn't changed anything. She reports that the symptoms are really not bothering her at this time and her main concern is that she is continuing to lose weight.  She does have an upcoming appointment with Dr. Fransico HimNida in endocrinology for her thyroid. Reports she is still feeling like her heart is racing and feeling somewhat anxious. Reports that she does still feel some pressure with trying to swallow like her food doesn't go down easily. She denies any fevers, shortness of breath, chest pain, or problems with her body temperature. She reports that her mood is good.     Past Medical History:  Diagnosis Date  . Asthma   . Cellulitis   . Scabies     No past surgical history on file.  Family History  Problem Relation Age of Onset  . Asthma Mother   . Diabetes Father   . Kidney failure Father   . Hypertension Sister   . Diabetes Mellitus II Brother   . Cancer Neg Hx      Social History   Social History  . Marital status: Single    Spouse name: N/A  . Number of children: N/A  . Years of education: N/A   Social History Main Topics  . Smoking status: Current Every Day Smoker    Packs/day: 0.25    Years: 20.00    Types: Cigarettes  . Smokeless tobacco: Never Used   . Alcohol use Yes     Comment: occ  . Drug use: Yes    Types: Marijuana     Comment: sporadically  . Sexual activity: Not Currently    Partners: Male    Birth control/ protection: None   Other Topics Concern  . None   Social History Narrative  . None    Review of Systems  Constitutional: Positive for unexpected weight change. Negative for activity change, chills, diaphoresis and fatigue.       Reports she continues to lose weight.  HENT: Negative for congestion, ear discharge, ear pain, sinus pain, sore throat and voice change.   Eyes: Negative for photophobia and visual disturbance.  Respiratory: Negative for cough, choking, chest tightness and shortness of breath.   Cardiovascular: Positive for palpitations.  Endocrine: Negative for polyphagia and polyuria.  Genitourinary: Negative for dysuria, frequency, pelvic pain and urgency.  Musculoskeletal: Negative for back pain and joint swelling.  Neurological: Positive for dizziness. Negative for tremors, seizures, syncope, facial asymmetry, speech difficulty, weakness, light-headedness, numbness and headaches.       Reports that occasionally she might have some dizziness but it is better since she was seen at the emergency department.  Psychiatric/Behavioral: Positive for sleep disturbance. Negative for agitation, dysphoric mood and suicidal ideas. The patient is  nervous/anxious.        Reports that she can feel anxious when her heart starts to race. Does not feel sad or down. Reports sometimes she finds it difficult to sleep.     Objective:   BP (!) 126/58 (BP Location: Left Arm, Patient Position: Sitting, Cuff Size: Normal)   Pulse (!) 104   Temp 99.1 F (37.3 C) (Other (Comment))   Resp 16   Ht 5\' 3"  (1.6 m)   Wt 112 lb 12 oz (51.1 kg)   LMP 07/07/2017   SpO2 99%   BMI 19.97 kg/m   Physical Exam  Constitutional: She is oriented to person, place, and time. She appears well-developed and well-nourished. No distress.    HENT:  Head: Normocephalic and atraumatic.  Eyes: Pupils are equal, round, and reactive to light.  Neck: Normal range of motion. Neck supple. No JVD present. No tracheal deviation present. Thyromegaly present.  Cardiovascular: Regular rhythm, normal heart sounds and normal pulses.  Tachycardia present.   Pulmonary/Chest: Effort normal and breath sounds normal. No stridor. No respiratory distress.  Musculoskeletal: Normal range of motion. She exhibits no edema.  Lymphadenopathy:    She has no cervical adenopathy.  Neurological: She is alert and oriented to person, place, and time. No cranial nerve deficit.  Skin: Skin is warm and dry.  Psychiatric: She has a normal mood and affect. Her behavior is normal. Judgment and thought content normal.  Nursing note and vitals reviewed.    Assessment and Plan   1. Hyperthyroidism Spoke with Dr. Fransico Him in endocrinology on the phone today. He recommended placing patient on PTU 100 mg, one by mouth 3 times a day. This medication was called in to [facility]. At this time will proceed with this medication because patient is greatly symptomatic. In addition will start on propranolol as written below. Patient counseled concerning worrisome signs and symptoms of told if develop to go to the emergency department.  - propranolol (INDERAL) 40 MG tablet; Take 1 tablet (40 mg total) by mouth 3 (three) times daily.  Dispense: 120 tablet; Refill: 1  2. Anemia, unspecified type Uncertain etiology. Patient will return to clinic for labs. - Iron - Fe+TIBC+Fer  3. MRI of brain abnormal I spoke with the emergency department physician regarding her MRI of the [place] Hua it was performed. In addition I have discussed the MRI with patient today. She does have an upcoming appointment at [facility] neurologic for evaluation. He is asymptomatic at this time and I have explained to her that I am not really sure what to make of the MRI results. She will keep the appointment however we  did discuss that in order of appointment she needs to be seen by Dr. Fransico Him first.  She also has an upcoming appointment at [facility]. We discussed today that I suspect that her difficulty with swallowing secondary to enlargement of her thyroid gland. Hopefully the symptoms will abate as her thyroid function decreases. However she will keep this appointment which is scheduled towards the in and of November in case her symptoms are not improved. If her symptoms do worsen before this appointment she will contact me.  Patient was told to call with any questions or concerns. We have discussed her labs, medications, and worrisome signs and symptoms in detail. All of her questions were answered.  Return in about 3 months (around 10/17/2017). Aliene Beams, MD 07/17/2017

## 2017-08-04 ENCOUNTER — Ambulatory Visit: Payer: Self-pay | Admitting: "Endocrinology

## 2017-08-22 ENCOUNTER — Ambulatory Visit: Payer: Managed Care, Other (non HMO) | Admitting: Gastroenterology

## 2017-08-28 ENCOUNTER — Encounter: Payer: Self-pay | Admitting: "Endocrinology

## 2017-08-28 ENCOUNTER — Ambulatory Visit: Payer: 59 | Admitting: "Endocrinology

## 2017-08-28 VITALS — BP 120/71 | HR 99 | Ht 63.0 in | Wt 113.0 lb

## 2017-08-28 DIAGNOSIS — E0590 Thyrotoxicosis, unspecified without thyrotoxic crisis or storm: Secondary | ICD-10-CM | POA: Insufficient documentation

## 2017-08-28 MED ORDER — PREDNISONE 10 MG PO TABS: 10.0000 mg | ORAL_TABLET | Freq: Every day | ORAL | 0 refills | Status: DC

## 2017-08-28 NOTE — Progress Notes (Signed)
Consult Note    Subjective:    Patient ID: Linda Lin, female    DOB: [birth year], PCP Aliene Beams, MD.   Past Medical History:  Diagnosis Date  . Asthma   . Cellulitis   . Scabies    History reviewed. No pertinent surgical history. Social History   Socioeconomic History  . Marital status: Single    Spouse name: None  . Number of children: None  . Years of education: None  . Highest education level: None  Social Needs  . Financial resource strain: None  . Food insecurity - worry: None  . Food insecurity - inability: None  . Transportation needs - medical: None  . Transportation needs - non-medical: None  Occupational History  . None  Tobacco Use  . Smoking status: Current Every Day Smoker    Packs/day: 0.25    Years: 20.00    Pack years: 5.00    Types: Cigarettes  . Smokeless tobacco: Never Used  Substance and Sexual Activity  . Alcohol use: Yes    Comment: occ  . Drug use: Yes    Types: Marijuana    Comment: sporadically  . Sexual activity: Not Currently    Partners: Male    Birth control/protection: None  Other Topics Concern  . None  Social History Narrative  . None   Outpatient Encounter Medications as of 08/28/2017  Medication Sig  . acetaminophen (TYLENOL) 500 MG tablet Take 1,000 mg by mouth every 6 (six) hours as needed.  Marland Kitchen ibuprofen (ADVIL,MOTRIN) 600 MG tablet Take 1 tablet (600 mg total) by mouth every 8 (eight) hours as needed for moderate pain.  . meclizine (ANTIVERT) 25 MG tablet Take 1 tablet (25 mg total) by mouth 3 (three) times daily as needed for dizziness.  . predniSONE (DELTASONE) 10 MG tablet Take 1 tablet (10 mg total) by mouth daily with breakfast.  . propranolol (INDERAL) 40 MG tablet Take 1 tablet (40 mg total) by mouth 3 (three) times daily. (Patient not taking: Reported on 08/28/2017)  . [DISCONTINUED] propranolol (INDERAL) 20 MG tablet Take 1 tablet (20 mg total) by mouth 3 (three) times daily.  . [DISCONTINUED]  propylthiouracil (PTU) 50 MG tablet Take 2 tablets (100 mg total) by mouth 3 (three) times daily. (Patient not taking: Reported on 08/28/2017)   No facility-administered encounter medications on file as of 08/28/2017.     ALLERGIES: Allergies  Allergen Reactions  . Cephalexin Rash  . Doxycycline Rash    VACCINATION STATUS:  There is no immunization history on file for this patient.   HPI  Linda Lin is 46 y.o. female who presents today with a medical history as above. she is being seen in consultation for hyperthyroidism requested by Aliene Beams, MD.  she has been dealing with symptoms of  weight loss of approximately 80 pounds over a period of a few years, palpitations, tremors, and sleep disturbance on and off for 2 years. - She did not have regular medical care prior to June 2018 when she was found to have suppressed TSH. She 0 follow up closely, return in early October with worsening symptoms at which time she was found to have high thyroid hormones and suppressed TSH. She was briefly treated with PTU and propranolol. Patient admits to inconsistency with his medications. She says she did not tolerate PTU and stopped it on 08/07/2017. She still has palpitations, tremors, heat intolerance.  her most recent thyroid labs revealed  Lab Results  Component Value Date  TSH <0.01 (L) 06/26/2017   TSH <0.010 (L) 02/25/2017   . she denies dysphagia, choking, shortness of breath, no recent voice change. These symptoms are progressively worsening and troubling to her   she denies family history of thyroid dysfunction , denies family hx of thyroid cancer. she has noticed enlargement of her thyroid.  she  is willing to proceed with appropriate work up and therapy for thyrotoxicosis.                           Review of systems  Constitutional: + weight loss, + fatigue, + subjective hyperthermia Eyes: no blurry vision, + xerophthalmia ENT: no sore throat, + nodules palpated in  throat, no dysphagia/odynophagia, nor hoarseness Cardiovascular: no Chest Pain, no Shortness of Breath, ++  palpitations, no leg swelling Respiratory: no cough, no SOB Gastrointestinal: no Nausea, no Vomiting, no Diarhhea Musculoskeletal: no muscle/joint aches Skin: no rashes Neurological: ++  tremors, no numbness, no tingling, no dizziness Psychiatric: no depression, ++  anxiety   Objective:    BP 120/71   Pulse 99   Ht 5\' 3"  (1.6 m)   Wt 113 lb (51.3 kg)   BMI 20.02 kg/m   Wt Readings from Last 3 Encounters:  08/28/17 113 lb (51.3 kg)  07/17/17 112 lb 12 oz (51.1 kg)  07/11/17 115 lb (52.2 kg)                                                Physical exam  Constitutional: ++ light  weight , not in acute distress, ++ anxious state of mind Eyes: PERRLA, EOMI, ++ exophthalmos ENT: moist mucous membranes, ++  Thyromegaly with bruit, no cervical lymphadenopathy Cardiovascular: ++ increased precordial activity, ++ tachycardic, no Murmur/Rubs, +Gallops Respiratory:  adequate breathing efforts, no gross chest deformity, Clear to auscultation bilaterally Gastrointestinal: abdomen soft, Non -tender, No distension, Bowel Sounds present Musculoskeletal: no gross deformities, strength intact in all four extremities Skin: moist, warm, no rashes Neurological: ++  tremor with outstretched hands,  ++ Deep Tendon Reflexes  on both lower extremities.   CMP     Component Value Date/Time   NA 138 07/11/2017 0824   K 3.9 07/11/2017 0824   CL 108 07/11/2017 0824   CO2 23 07/11/2017 0824   GLUCOSE 102 (H) 07/11/2017 0824   BUN 16 07/11/2017 0824   CREATININE 0.37 (L) 07/11/2017 0824   CREATININE 0.33 (L) 06/26/2017 1016   CALCIUM 9.5 07/11/2017 0824   PROT 7.3 06/26/2017 1016   ALBUMIN 3.8 02/25/2017 0645   AST 18 06/26/2017 1016   ALT 17 06/26/2017 1016   ALKPHOS 126 02/25/2017 0645   BILITOT 0.4 06/26/2017 1016   GFRNONAA >60 07/11/2017 0824   GFRNONAA 132 06/26/2017 1016   GFRAA  >60 07/11/2017 0824   GFRAA 153 06/26/2017 1016     CBC    Component Value Date/Time   WBC 3.8 (L) 07/11/2017 0824   RBC 4.12 07/11/2017 0824   HGB 11.8 (L) 07/11/2017 0824   HCT 36.9 07/11/2017 0824   PLT 195 07/11/2017 0824   MCV 89.6 07/11/2017 0824   MCH 28.6 07/11/2017 0824   MCHC 32.0 07/11/2017 0824   RDW 13.2 07/11/2017 0824   LYMPHSABS 2.4 07/11/2017 0824   MONOABS 0.6 07/11/2017 0824   EOSABS 0.0 07/11/2017 16100824  BASOSABS 0.0 07/11/2017 0824    Lab Results  Component Value Date   TSH <0.01 (L) 06/26/2017   TSH <0.010 (L) 02/25/2017     Results for BRIANAH, HOPSON (MRN [id number]) as of 08/28/2017 13:36  Ref. Range 02/25/2017 06:45 06/26/2017 10:16  TSH Latest Units: mIU/L <0.010 (L) <0.01 (L)  Thyroxine (T4) Latest Ref Range: 5.1 - 11.9 mcg/dL  78.2 (H)  Free Thyroxine Index Latest Ref Range: 1.4 - 3.8   12.8 (H)    Assessment & Plan:   1. Hyperthyroidism  she is being seen at a kind request of Aliene Beams, MD. her history and most recent labs are reviewed, and she was examined clinically. Subjective and objective findings are consistent with thyrotoxicosis likely from primary hyperthyroidism/Graves' disease. The potential risks of untreated thyrotoxicosis and the need for definitive therapy have been discussed in detail with her, and she agrees to proceed with diagnostic workup and treatment plan.   I like to  obtain confirmatory thyroid uptake and scan will be scheduled to be done as soon as possible.   Options of therapy are discussed with her. Best option of definitive therapy will be RAI ablation of the thyroid, with subsequent need for lifelong thyroid hormone replacement. she is made aware of this outcome  and she is  willing to proceed. she will return in 10 days for treatment decision. She advised to stay off of PTU which she has not taken in the last 20 days.   I urged her to resume and be consistent with propranolol 40 mg by mouth 3 times a day,  and in preparation for I-131 thyroid tablet therapy, I prescribed 10 mg of prednisone for her to take daily for the next 20 days.  - I advised her to maintain close follow up with Aliene Beams, MD for primary care needs.  Follow up plan: Return in about 10 days (around 09/07/2017) for follow up with labs after I131 therapy.   Thank you for involving me in the care of this pleasant patient, and I will continue to update you with her progress.  Marquis Lunch, MD [facility] Phone: [phone]  Fax: [phone]   08/28/2017, 1:30 PM  This note was partially dictated with voice recognition software. Similar sounding words can be transcribed inadequately or may not  be corrected upon review.

## 2017-09-03 ENCOUNTER — Encounter (HOSPITAL_COMMUNITY)
Admission: RE | Admit: 2017-09-03 | Discharge: 2017-09-03 | Disposition: A | Discharge status: Home / Self Care | Payer: 59 | Source: Ambulatory Visit | Attending: "Endocrinology | Admitting: "Endocrinology

## 2017-09-03 ENCOUNTER — Encounter (HOSPITAL_COMMUNITY): Payer: Self-pay

## 2017-09-03 DIAGNOSIS — E0590 Thyrotoxicosis, unspecified without thyrotoxic crisis or storm: Secondary | ICD-10-CM

## 2017-09-03 MED ORDER — SODIUM IODIDE I-123 7.4 MBQ CAPS
400.0000 | ORAL_CAPSULE | Freq: Once | ORAL | Status: AC
  Administered 2017-09-03: 448 via ORAL

## 2017-09-04 ENCOUNTER — Encounter (HOSPITAL_COMMUNITY)
Admission: RE | Admit: 2017-09-04 | Discharge: 2017-09-04 | Disposition: A | Discharge status: Home / Self Care | Payer: 59 | Source: Ambulatory Visit | Attending: "Endocrinology | Admitting: "Endocrinology

## 2017-09-12 ENCOUNTER — Ambulatory Visit: Payer: 59 | Admitting: "Endocrinology

## 2017-09-17 ENCOUNTER — Other Ambulatory Visit: Payer: Self-pay | Admitting: "Endocrinology

## 2017-09-17 DIAGNOSIS — E0590 Thyrotoxicosis, unspecified without thyrotoxic crisis or storm: Secondary | ICD-10-CM

## 2017-09-17 LAB — TSH: TSH: 0.01 mIU/L — ABNORMAL LOW

## 2017-09-17 LAB — T4, FREE: Free T4: 4.5 ng/dL — ABNORMAL HIGH (ref 0.8–1.8)

## 2017-09-19 ENCOUNTER — Encounter: Payer: Self-pay | Admitting: "Endocrinology

## 2017-09-19 ENCOUNTER — Ambulatory Visit: Payer: 59 | Admitting: "Endocrinology

## 2017-09-19 VITALS — BP 124/75 | HR 106 | Ht 63.0 in | Wt 113.0 lb

## 2017-09-19 DIAGNOSIS — E0590 Thyrotoxicosis, unspecified without thyrotoxic crisis or storm: Secondary | ICD-10-CM

## 2017-09-19 DIAGNOSIS — E0500 Thyrotoxicosis with diffuse goiter without thyrotoxic crisis or storm: Secondary | ICD-10-CM | POA: Insufficient documentation

## 2017-09-19 MED ORDER — PROPRANOLOL HCL 20 MG PO TABS: 20.0000 mg | ORAL_TABLET | Freq: Three times a day (TID) | ORAL | 1 refills | Status: DC

## 2017-09-19 NOTE — Progress Notes (Signed)
Consult Note    Subjective:    Patient ID: Linda Lin, female    DOB: December 22, 1970, PCP Aliene Beams, MD.   Past Medical History:  Diagnosis Date  . Asthma   . Cellulitis   . Scabies    History reviewed. No pertinent surgical history. Social History   Socioeconomic History  . Marital status: Single    Spouse name: None  . Number of children: None  . Years of education: None  . Highest education level: None  Social Needs  . Financial resource strain: None  . Food insecurity - worry: None  . Food insecurity - inability: None  . Transportation needs - medical: None  . Transportation needs - non-medical: None  Occupational History  . None  Tobacco Use  . Smoking status: Current Every Day Smoker    Packs/day: 0.25    Years: 20.00    Pack years: 5.00    Types: Cigarettes  . Smokeless tobacco: Never Used  Substance and Sexual Activity  . Alcohol use: Yes    Comment: occ  . Drug use: Yes    Types: Marijuana    Comment: sporadically  . Sexual activity: Not Currently    Partners: Male    Birth control/protection: None  Other Topics Concern  . None  Social History Narrative  . None   Outpatient Encounter Medications as of 09/19/2017  Medication Sig  . acetaminophen (TYLENOL) 500 MG tablet Take 1,000 mg by mouth every 6 (six) hours as needed.  Marland Kitchen ibuprofen (ADVIL,MOTRIN) 600 MG tablet Take 1 tablet (600 mg total) by mouth every 8 (eight) hours as needed for moderate pain.  . meclizine (ANTIVERT) 25 MG tablet Take 1 tablet (25 mg total) by mouth 3 (three) times daily as needed for dizziness.  . predniSONE (DELTASONE) 10 MG tablet Take 1 tablet (10 mg total) by mouth daily with breakfast.  . propranolol (INDERAL) 20 MG tablet Take 1 tablet (20 mg total) by mouth 3 (three) times daily.  . [DISCONTINUED] propranolol (INDERAL) 40 MG tablet Take 1 tablet (40 mg total) by mouth 3 (three) times daily. (Patient not taking: Reported on 08/28/2017)   No  facility-administered encounter medications on file as of 09/19/2017.     ALLERGIES: Allergies  Allergen Reactions  . Cephalexin Rash  . Doxycycline Rash    VACCINATION STATUS:  There is no immunization history on file for this patient.   HPI  Linda Lin is 46 y.o. female who presents today with a medical history as above.  She is returning to follow-up with thyroid uptake and scan and repeat thyroid function tests after she was seen in consultation for hyperthyroidism. - In preparation for ablative treatment, she was given a short course of prednisone and propranolol. She says she did not tolerate  Prednisone causing "skin breakout " not forced her to stop.   she has been dealing with symptoms of  weight loss of approximately 80 pounds over a period of a few years, palpitations, tremors, and sleep disturbance on and off for 2 years. - She did not have regular medical care prior to June 2018 when she was found to have suppressed TSH, and in October 2018 still suppressed TSH and high thyroid hormone levels. She was briefly treated with PTU and propranolol. Patient admits to inconsistency with these medications. She says she did not tolerate PTU and stopped it on 08/07/2017. She still has palpitations, tremors, heat intolerance.  her most recent thyroid labs revealed  Lab Results  Component Value Date   TSH 0.01 (L) 09/17/2017   TSH <0.01 (L) 06/26/2017   TSH <0.010 (L) 02/25/2017   FREET4 4.5 (H) 09/17/2017    she denies dysphagia, choking, shortness of breath, no recent voice change. These symptoms are progressively worsening and troubling to her   she denies family history of thyroid dysfunction , denies family hx of thyroid cancer. she has noticed enlargement of her thyroid.  she  is willing to proceed with appropriate work up and therapy for thyrotoxicosis.                          Review of systems  Constitutional: + Steady weight since last visit, + fatigue , +  subjective hyperthermia Eyes: no blurry vision, + xerophthalmia ENT: no sore throat, + nodules palpated in throat, no dysphagia/odynophagia, nor hoarseness Cardiovascular: no Chest Pain, no Shortness of Breath, ++  palpitations, no leg swelling Respiratory: no cough, no SOB Gastrointestinal: no Nausea, no Vomiting, no Diarhhea Musculoskeletal: no muscle/joint aches Skin: no rashes Neurological: ++  tremors, no numbness, no tingling, no dizziness Psychiatric: no depression, ++  anxiety   Objective:    BP 124/75   Pulse (!) 106   Ht 5\' 3"  (1.6 m)   Wt 113 lb (51.3 kg)   BMI 20.02 kg/m   Wt Readings from Last 3 Encounters:  09/19/17 113 lb (51.3 kg)  08/28/17 113 lb (51.3 kg)  07/17/17 112 lb 12 oz (51.1 kg)                                                Physical exam  Constitutional: ++ light  weight , not in acute distress, + anxious state of mind Eyes: PERRLA, EOMI, ++ exophthalmos ENT: moist mucous membranes, ++  Thyromegaly with bruit, no cervical lymphadenopathy Cardiovascular: ++ increased precordial activity, ++ tachycardic, no Murmur/Rubs, +Gallops Respiratory:  adequate breathing efforts, no gross chest deformity, Clear to auscultation bilaterally Gastrointestinal: abdomen soft, Non -tender, No distension, Bowel Sounds present Musculoskeletal: no gross deformities, strength intact in all four extremities Skin: moist, warm, no rashes Neurological: ++  tremor with outstretched hands,  ++ Deep Tendon Reflexes  on both lower extremities.   CMP     Component Value Date/Time   NA 138 07/11/2017 0824   K 3.9 07/11/2017 0824   CL 108 07/11/2017 0824   CO2 23 07/11/2017 0824   GLUCOSE 102 (H) 07/11/2017 0824   BUN 16 07/11/2017 0824   CREATININE 0.37 (L) 07/11/2017 0824   CREATININE 0.33 (L) 06/26/2017 1016   CALCIUM 9.5 07/11/2017 0824   PROT 7.3 06/26/2017 1016   ALBUMIN 3.8 02/25/2017 0645   AST 18 06/26/2017 1016   ALT 17 06/26/2017 1016   ALKPHOS 126  02/25/2017 0645   BILITOT 0.4 06/26/2017 1016   GFRNONAA >60 07/11/2017 0824   GFRNONAA 132 06/26/2017 1016   GFRAA >60 07/11/2017 0824   GFRAA 153 06/26/2017 1016     CBC    Component Value Date/Time   WBC 3.8 (L) 07/11/2017 0824   RBC 4.12 07/11/2017 0824   HGB 11.8 (L) 07/11/2017 0824   HCT 36.9 07/11/2017 0824   PLT 195 07/11/2017 0824   MCV 89.6 07/11/2017 0824   MCH 28.6 07/11/2017 0824   MCHC 32.0 07/11/2017 0824   RDW 13.2 07/11/2017  0824   LYMPHSABS 2.4 07/11/2017 0824   MONOABS 0.6 07/11/2017 0824   EOSABS 0.0 07/11/2017 0824   BASOSABS 0.0 07/11/2017 0824    Lab Results  Component Value Date   TSH 0.01 (L) 09/17/2017   TSH <0.01 (L) 06/26/2017   TSH <0.010 (L) 02/25/2017   FREET4 4.5 (H) 09/17/2017    Results for YATZARY, STELMACK (MRN [id number]) as of 09/19/2017 10:09  Ref. Range 06/26/2017 10:16 09/17/2017 11:35  TSH Latest Units: mIU/L <0.01 (L) 0.01 (L)  T4,Free(Direct) Latest Ref Range: 0.8 - 1.8 ng/dL  4.5 (H)  Thyroxine (T4) Latest Ref Range: 5.1 - 11.9 mcg/dL 26.3 (H)   Free Thyroxine Index Latest Ref Range: 1.4 - 3.8  12.8 (H)      Assessment & Plan:   1. Hyperthyroidism 2. Graves' disease  - Her interval workup including thyroid uptake and scan  confirms primary hyperthyroidism from Graves' disease.  - She continues to have trouble keeping appointments. The potential risks of untreated thyrotoxicosis including thyroid storm and the need for definitive therapy have been discussed in detail with her, and she agrees to proceed with  treatment plan.   Options of therapy are discussed with her. Best option of definitive therapy will be RAI ablation of the thyroid, with subsequent need for lifelong thyroid hormone replacement. she is made aware of this outcome  and she is  willing to proceed. This treatment would be scheduled to be administered as soon as possible. - I urged her to remain compliant with propranolol 20 mg by mouth 3 times a day in  preparation for ablative therapy with I-131. - she also has 10 mg of prednisone x 20 days, however reports intolerance to this medication.  - I advised her to maintain close follow up with Aliene Beams, MD for primary care needs.  Follow up plan: Return in about 8 weeks (around 11/14/2017) for follow up with labs after I131 therapy.  Thank you for involving me in the care of this pleasant patient, and I will continue to update you with her progress.  Marquis Lunch, MD [facility] Phone: [phone]  Fax: [phone]   09/19/2017, 10:36 AM  This note was partially dictated with voice recognition software. Similar sounding words can be transcribed inadequately or may not  be corrected upon review.

## 2017-10-16 ENCOUNTER — Encounter (HOSPITAL_COMMUNITY): Payer: Self-pay

## 2017-10-16 ENCOUNTER — Encounter (HOSPITAL_COMMUNITY)
Admission: RE | Admit: 2017-10-16 | Discharge: 2017-10-16 | Disposition: A | Discharge status: Home / Self Care | Payer: BLUE CROSS/BLUE SHIELD | Source: Ambulatory Visit | Attending: "Endocrinology | Admitting: "Endocrinology

## 2017-10-16 DIAGNOSIS — E0590 Thyrotoxicosis, unspecified without thyrotoxic crisis or storm: Secondary | ICD-10-CM | POA: Diagnosis not present

## 2017-10-16 LAB — I-STAT BETA HCG BLOOD, ED (NOT ORDERABLE)

## 2017-10-16 MED ORDER — SODIUM IODIDE I 131 CAPSULE
18.0000 | Freq: Once | INTRAVENOUS | Status: AC | PRN
  Administered 2017-10-16: 17.99 via ORAL

## 2017-11-14 ENCOUNTER — Ambulatory Visit: Payer: 59 | Admitting: "Endocrinology

## 2017-12-04 ENCOUNTER — Ambulatory Visit: Payer: Self-pay | Admitting: "Endocrinology

## 2017-12-15 DIAGNOSIS — E0590 Thyrotoxicosis, unspecified without thyrotoxic crisis or storm: Secondary | ICD-10-CM | POA: Diagnosis not present

## 2017-12-15 LAB — T4, FREE: Free T4: 1.2 ng/dL (ref 0.8–1.8)

## 2017-12-15 LAB — TSH

## 2017-12-22 ENCOUNTER — Encounter: Payer: Self-pay | Admitting: "Endocrinology

## 2017-12-22 ENCOUNTER — Ambulatory Visit: Payer: BLUE CROSS/BLUE SHIELD | Admitting: "Endocrinology

## 2017-12-22 VITALS — BP 136/84 | HR 86 | Ht 63.0 in | Wt 127.0 lb

## 2017-12-22 DIAGNOSIS — E0590 Thyrotoxicosis, unspecified without thyrotoxic crisis or storm: Secondary | ICD-10-CM

## 2017-12-22 NOTE — Progress Notes (Signed)
Endocrinology follow-up  Note    Subjective:    Patient ID: Linda Lin, female    DOB: Oct 14, 1970, PCP Aliene Beams, MD.   Past Medical History:  Diagnosis Date  . Asthma   . Cellulitis   . Scabies    History reviewed. No pertinent surgical history. Social History   Socioeconomic History  . Marital status: Single    Spouse name: Not on file  . Number of children: Not on file  . Years of education: Not on file  . Highest education level: Not on file  Occupational History  . Not on file  Social Needs  . Financial resource strain: Not on file  . Food insecurity:    Worry: Not on file    Inability: Not on file  . Transportation needs:    Medical: Not on file    Non-medical: Not on file  Tobacco Use  . Smoking status: Current Every Day Smoker    Packs/day: 0.25    Years: 20.00    Pack years: 5.00    Types: Cigarettes  . Smokeless tobacco: Never Used  Substance and Sexual Activity  . Alcohol use: Yes    Comment: occ  . Drug use: Yes    Types: Marijuana    Comment: sporadically  . Sexual activity: Not Currently    Partners: Male    Birth control/protection: None  Lifestyle  . Physical activity:    Days per week: Not on file    Minutes per session: Not on file  . Stress: Not on file  Relationships  . Social connections:    Talks on phone: Not on file    Gets together: Not on file    Attends religious service: Not on file    Active member of club or organization: Not on file    Attends meetings of clubs or organizations: Not on file    Relationship status: Not on file  Other Topics Concern  . Not on file  Social History Narrative  . Not on file   Outpatient Encounter Medications as of 12/22/2017  Medication Sig  . acetaminophen (TYLENOL) 500 MG tablet Take 1,000 mg by mouth every 6 (six) hours as needed.  Marland Kitchen ibuprofen (ADVIL,MOTRIN) 600 MG tablet Take 1 tablet (600 mg total) by mouth every 8 (eight) hours as needed for moderate pain.  .  meclizine (ANTIVERT) 25 MG tablet Take 1 tablet (25 mg total) by mouth 3 (three) times daily as needed for dizziness.  . propranolol (INDERAL) 20 MG tablet Take 1 tablet (20 mg total) by mouth 3 (three) times daily.  . [DISCONTINUED] predniSONE (DELTASONE) 10 MG tablet Take 1 tablet (10 mg total) by mouth daily with breakfast.   No facility-administered encounter medications on file as of 12/22/2017.     ALLERGIES: Allergies  Allergen Reactions  . Cephalexin Rash  . Doxycycline Rash    VACCINATION STATUS:  There is no immunization history on file for this patient.   HPI  Linda Lin is 47 y.o. female who presents today with a medical history as above.  She is returning to follow-up after I-131 thyroid ablation on October 16, 2017 for Graves' disease.    Her previsit thyroid function tests are consistent with treatment effect but not hypothyroid yet.    -She continues to feel better, no new complaints.  She has regained 14 pounds of her body weight; she lost 80 pounds prior to her treatment. -She sleeps well, denies palpitations, tremors, anxiety.  her most recent thyroid labs revealed  Lab Results  Component Value Date   TSH <0.01 (L) 12/15/2017   TSH 0.01 (L) 09/17/2017   TSH <0.01 (L) 06/26/2017   TSH <0.010 (L) 02/25/2017   FREET4 1.2 12/15/2017   FREET4 4.5 (H) 09/17/2017    she denies dysphagia, choking, shortness of breath, no recent voice change. she denies family history of thyroid dysfunction , denies family hx of thyroid cancer. she has noticed enlargement of her thyroid.                           Review of systems  Constitutional: + Steady weight gain of 14 pounds since last visit, - fatigue , - subjective hyperthermia Eyes: no blurry vision, + xerophthalmia ENT: no sore throat, + nodules palpated in throat, no dysphagia/odynophagia, nor hoarseness Cardiovascular: no Chest Pain, no Shortness of Breath, - palpitations, no leg swelling Respiratory: no  cough, no SOB Gastrointestinal: no Nausea, no Vomiting, no Diarhhea Musculoskeletal: no muscle/joint aches Skin: no rashes Neurological: -  tremors, no numbness, no tingling, no dizziness Psychiatric: no depression, -  anxiety   Objective:    BP 136/84   Pulse 86   Ht 5\' 3"  (1.6 m)   Wt 127 lb (57.6 kg)   BMI 22.50 kg/m   Wt Readings from Last 3 Encounters:  12/22/17 127 lb (57.6 kg)  09/19/17 113 lb (51.3 kg)  08/28/17 113 lb (51.3 kg)                                                Physical exam  Constitutional:  + Appropriate weight for height, not in acute distress, + stable state of mind, alert and oriented x3.   Eyes: PERRLA, EOMI, + exophthalmos ENT: moist mucous membranes, +  Thyromegaly with bruit, no cervical lymphadenopathy Cardiovascular: ++ increased precordial activity, - tachycardic, no Murmur/Rubs, -Gallops Respiratory:  adequate breathing efforts, no gross chest deformity, Clear to auscultation bilaterally Gastrointestinal: abdomen soft, Non -tender, No distension, Bowel Sounds present Musculoskeletal: no gross deformities, strength intact in all four extremities Skin: moist, warm, no rashes Neurological: -  tremor with outstretched hands,  +Deep Tendon Reflexes  on both lower extremities.   CMP     Component Value Date/Time   NA 138 07/11/2017 0824   K 3.9 07/11/2017 0824   CL 108 07/11/2017 0824   CO2 23 07/11/2017 0824   GLUCOSE 102 (H) 07/11/2017 0824   BUN 16 07/11/2017 0824   CREATININE 0.37 (L) 07/11/2017 0824   CREATININE 0.33 (L) 06/26/2017 1016   CALCIUM 9.5 07/11/2017 0824   PROT 7.3 06/26/2017 1016   ALBUMIN 3.8 02/25/2017 0645   AST 18 06/26/2017 1016   ALT 17 06/26/2017 1016   ALKPHOS 126 02/25/2017 0645   BILITOT 0.4 06/26/2017 1016   GFRNONAA >60 07/11/2017 0824   GFRNONAA 132 06/26/2017 1016   GFRAA >60 07/11/2017 0824   GFRAA 153 06/26/2017 1016     CBC    Component Value Date/Time   WBC 3.8 (L) 07/11/2017 0824   RBC  4.12 07/11/2017 0824   HGB 11.8 (L) 07/11/2017 0824   HCT 36.9 07/11/2017 0824   PLT 195 07/11/2017 0824   MCV 89.6 07/11/2017 0824   MCH 28.6 07/11/2017 0824   MCHC 32.0 07/11/2017 0824   RDW  13.2 07/11/2017 0824   LYMPHSABS 2.4 07/11/2017 0824   MONOABS 0.6 07/11/2017 0824   EOSABS 0.0 07/11/2017 0824   BASOSABS 0.0 07/11/2017 0824    Lab Results  Component Value Date   TSH <0.01 (L) 12/15/2017   TSH 0.01 (L) 09/17/2017   TSH <0.01 (L) 06/26/2017   TSH <0.010 (L) 02/25/2017   FREET4 1.2 12/15/2017   FREET4 4.5 (H) 09/17/2017    Results for KELA, BURKI (MRN [id number]) as of 12/22/2017 09:19  Ref. Range 09/17/2017 11:35 12/15/2017 10:09  TSH Latest Units: mIU/L 0.01 (L) <0.01 (L)  T4,Free(Direct) Latest Ref Range: 0.8 - 1.8 ng/dL 4.5 (H) 1.2      Assessment & Plan:   1. Hyperthyroidism 2. Graves' disease  -She is status post radioactive iodine ablation on October 16, 2017 for Graves' disease.  Her previsit thyroid function tests are consistent with treatment effect but not hypothyroid.   -We will not initiate thyroid hormone replacement yet. - I advised her to lower her propranolol to 20 mg p.o. every morning with breakfast.   -She will have repeat thyroid function test in 7 weeks with office visit.  Importance of her next visit is reemphasized for her.  She understands the subsequent need for thyroid hormone replacement.  - I advised her to maintain close follow up with Aliene Beams, MD for primary care needs.  Follow up plan: Return in about 8 weeks (around 02/16/2018) for follow up with pre-visit labs.  Thank you for involving me in the care of this pleasant patient, and I will continue to update you with her progress.  Marquis Lunch, MD [facility] Phone: [phone]  Fax: [phone]   12/22/2017, 9:32 AM  This note was partially dictated with voice recognition software. Similar sounding words can be  transcribed inadequately or may not  be corrected upon review.

## 2018-02-17 ENCOUNTER — Ambulatory Visit: Payer: BLUE CROSS/BLUE SHIELD | Admitting: "Endocrinology

## 2018-02-17 DIAGNOSIS — E0590 Thyrotoxicosis, unspecified without thyrotoxic crisis or storm: Secondary | ICD-10-CM | POA: Diagnosis not present

## 2018-02-17 LAB — TSH: TSH: 33.22 mIU/L — ABNORMAL HIGH

## 2018-02-17 LAB — T4, FREE: FREE T4: 0.2 ng/dL — AB (ref 0.8–1.8)

## 2018-02-20 ENCOUNTER — Encounter: Payer: Self-pay | Admitting: Family Medicine

## 2018-02-20 ENCOUNTER — Other Ambulatory Visit: Payer: Self-pay

## 2018-02-20 ENCOUNTER — Ambulatory Visit: Payer: BLUE CROSS/BLUE SHIELD | Admitting: Family Medicine

## 2018-02-20 ENCOUNTER — Telehealth: Payer: Self-pay | Admitting: Family Medicine

## 2018-02-20 VITALS — BP 138/88 | HR 84 | Temp 98.7°F | Resp 16 | Ht 63.0 in | Wt 141.0 lb

## 2018-02-20 DIAGNOSIS — M25511 Pain in right shoulder: Secondary | ICD-10-CM

## 2018-02-20 DIAGNOSIS — G5601 Carpal tunnel syndrome, right upper limb: Secondary | ICD-10-CM | POA: Diagnosis not present

## 2018-02-20 MED ORDER — PREDNISONE 10 MG (21) PO TBPK: ORAL_TABLET | ORAL | 0 refills | Status: DC

## 2018-02-20 MED ORDER — DICLOFENAC SODIUM 75 MG PO TBEC: 75.0000 mg | DELAYED_RELEASE_TABLET | Freq: Two times a day (BID) | ORAL | 0 refills | Status: DC

## 2018-02-20 MED ORDER — KETOROLAC TROMETHAMINE 60 MG/2ML IM SOLN
60.0000 mg | Freq: Once | INTRAMUSCULAR | Status: AC
  Administered 2018-02-20: 60 mg via INTRAMUSCULAR

## 2018-02-20 NOTE — Telephone Encounter (Signed)
Called patient to offer her a 10:40 this morning due to cancellation, no answer, lvm.

## 2018-02-20 NOTE — Progress Notes (Signed)
Patient ID: Linda Lin, female    DOB: Feb 21, 1971, 47 y.o.   MRN: [id number]  Chief Complaint  Patient presents with  . Shoulder Pain    RIGHT shoulder. goes down arm  . Wrist Pain    right>left    Allergies Cephalexin and Doxycycline  Subjective:   Linda Lin is a 47 y.o. female who presents to [facility] today.  HPI Here for visit b/c of shoulder and wrist pain. 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  Reports that she has had right-sided shoulder pain for 2 months. No trauma.  She reports that she has a chronic baseline nagging pain but also has intermittent episodes of sharp pain. Baseline pain feels like nagging, chronic, point of wanting to sleep b/c does not want pain, pain is like a constant, gnawing, not quite like an ache but deeper. Then gets sharp pain, can come on at anytime, just hits, on and off throughout the day. Pain sharp same but more frequent.  Has used ice packs, Motrin 800 mg qid, and occasional Tylenol.  Has had some relief of pain. Aggravated by activity. Has had this in the past, saw someone and they gave her brace. Given pain pills and therapy.   She also reports that for several months has had pain in her right wrist.  Reports that she gets numbness and tingling in her hand that radiated up her forearm.  Reports that she feels like her thumb muscle is gone down in size.  Her thumb hurts terribly.  Has not used any wrist splints.  Shoulder Pain   This is a new problem. Episode onset: 2 months. There has been no history of extremity trauma. The problem occurs constantly. The problem has been unchanged. The quality of the pain is described as aching and sharp. The pain is at a severity of 10/10. The pain is severe. Pertinent negatives include no fever, inability  to bear weight, itching, limited range of motion, numbness or tingling. She has tried OTC pain meds, NSAIDS and cold for the symptoms. The treatment provided mild relief. Family history does not include gout or rheumatoid arthritis. There is no history of diabetes, gout, osteoarthritis or rheumatoid arthritis.    Past Medical History:  Diagnosis Date  . Asthma   . Cellulitis   . Scabies     No past surgical history on file.  Family History  Problem Relation Age of Onset  . Asthma Mother   . Diabetes Father   . Kidney failure Father   . Hypertension Sister   . Diabetes Mellitus II Brother   . Cancer Neg Hx      Social History   Socioeconomic History  . Marital status: Single    Spouse name: Not on file  . Number of children: Not on file  . Years of education: Not on file  . Highest education level: Not on file  Occupational History  . Not on file  Social Needs  . Financial resource strain: Not on file  . Food insecurity:    Worry: Not on file    Inability: Not on file  . Transportation needs:    Medical: Not on file    Non-medical: Not on file  Tobacco Use  . Smoking status: Current Every Day  Smoker    Packs/day: 0.25    Years: 20.00    Pack years: 5.00    Types: Cigarettes  . Smokeless tobacco: Never Used  Substance and Sexual Activity  . Alcohol use: Yes    Comment: occ  . Drug use: Yes    Types: Marijuana    Comment: sporadically  . Sexual activity: Not Currently    Partners: Male    Birth control/protection: None  Lifestyle  . Physical activity:    Days per week: Not on file    Minutes per session: Not on file  . Stress: Not on file  Relationships  . Social connections:    Talks on phone: Not on file    Gets together: Not on file    Attends religious service: Not on file    Active member of club or organization: Not on file    Attends meetings of clubs or organizations: Not on file    Relationship status: Not on file  Other Topics Concern  .  Not on file  Social History Narrative   Work At [employer]   Supervisor.    Current Outpatient Medications on File Prior to Visit  Medication Sig Dispense Refill  . acetaminophen (TYLENOL) 500 MG tablet Take 1,000 mg by mouth every 6 (six) hours as needed.    . meclizine (ANTIVERT) 25 MG tablet Take 1 tablet (25 mg total) by mouth 3 (three) times daily as needed for dizziness. 15 tablet 0  . propranolol (INDERAL) 20 MG tablet Take 20 mg by mouth daily.     No current facility-administered medications on file prior to visit.     Review of Systems  Constitutional: Negative for fever.  Musculoskeletal: Negative for gout.  Skin: Negative for itching.  Neurological: Negative for tingling and numbness.     Objective:   BP 138/88 (BP Location: Left Arm, Patient Position: Sitting, Cuff Size: Normal)   Pulse 84   Temp 98.7 F (37.1 C) (Temporal)   Resp 16   Ht 5\' 3"  (1.6 m)   Wt 141 lb (64 kg)   SpO2 98%   BMI 24.98 kg/m   Physical Exam  Constitutional: She is oriented to person, place, and time. She appears well-developed and well-nourished. No distress.  HENT:  Head: Normocephalic and atraumatic.  Eyes: Pupils are equal, round, and reactive to light. Conjunctivae are normal. No scleral icterus.  Cardiovascular: Normal rate, regular rhythm and normal heart sounds.  Pulmonary/Chest: Effort normal and breath sounds normal. No respiratory distress.  Musculoskeletal:       Right shoulder: She exhibits decreased range of motion, tenderness, pain and decreased strength. She exhibits no bony tenderness, no swelling, no effusion, no deformity and no laceration.       Right wrist: She exhibits normal range of motion, no tenderness, no bony tenderness, no swelling, no effusion, no crepitus and no deformity.  Questionable right-sided thenar atrophy when compared to left side. Neuro vasculature intact in upper and lower extremities. Pain in right shoulder with abduction.  Hawkins test  negative.  Pain with internal and external rotation of arm at shoulder joint on right side.  Neurological: She is alert and oriented to person, place, and time. No cranial nerve deficit.  Skin: Skin is warm and dry.  Psychiatric: She has a normal mood and affect. Her behavior is normal. Judgment and thought content normal.  Nursing note and vitals reviewed.    Assessment and Plan  1. Acute/chronic pain of right shoulder Referral to  orthopedics for evaluation.  Suspect patient has some type of rotator cuff tendinitis/impingement. Continue ice to area as directed. Defers steroid injection.  Will give Sterapred. Patient counseled in detail regarding the risks of medication. Told to call or return to clinic if develop any worrisome signs or symptoms. Patient voiced understanding.  Discussed risks of cardiovascular thrombotic events related to NSAIDS. Discussed increased risk of AMI and CVA. Discussed risk of serious GI adverse events including bleeding, ulcers, and perforation. Patient understands risks of this medication.   - diclofenac (VOLTAREN) 75 MG EC tablet; Take 1 tablet (75 mg total) by mouth 2 (two) times daily.  Dispense: 30 tablet; Refill: 0 - predniSONE (STERAPRED UNI-PAK 21 TAB) 10 MG (21) TBPK tablet; Take as directed.  Dispense: 21 tablet; Refill: 0 - Ambulatory referral to Orthopedic Surgery - ketorolac (TORADOL) injection 60 mg - DME Other see comment  2. Carpal tunnel syndrome, right Referral to orthopedics.  Possible carpal tunnel syndrome.  Evaluation by orthopedics - DME Other see comment   The 5 A's Model for treating Tobacco Use and Dependence was used today. I have identified and documented tobacco use status for this patient. I have urged the patient to quit tobacco use. At this time, the patient is unwilling and not ready to attempt to quit. I have provided patient with information regarding risks, cessation techniques, and interventions that might increase future  attempts to quit smoking. I will plan on again addressing tobacco dependence at the next visit.  Return in about 3 months (around 05/23/2018) for CPE. Aliene Beams, MD 02/20/2018

## 2018-02-20 NOTE — Patient Instructions (Signed)
Shoulder Pain Many things can cause shoulder pain, including:  An injury.  Moving the arm in the same way again and again (overuse).  Joint pain (arthritis).  Follow these instructions at home: Take these actions to help with your pain:  Squeeze a soft ball or a foam pad as much as you can. This helps to prevent swelling. It also makes the arm stronger.  Take over-the-counter and prescription medicines only as told by your doctor.  If told, put ice on the area: ? Put ice in a plastic bag. ? Place a towel between your skin and the bag. ? Leave the ice on for 20 minutes, 2-3 times per day. Stop putting on ice if it does not help with the pain.  If you were given a shoulder sling or immobilizer: ? Wear it as told. ? Remove it to shower or bathe. ? Move your arm as little as possible. ? Keep your hand moving. This helps prevent swelling.  Contact a doctor if:  Your pain gets worse.  Medicine does not help your pain.  You have new pain in your arm, hand, or fingers. Get help right away if:  Your arm, hand, or fingers: ? Tingle. ? Are numb. ? Are swollen. ? Are painful. ? Turn white or blue. This information is not intended to replace advice given to you by your health care provider. Make sure you discuss any questions you have with your health care provider. Document Released: 02/26/2008 Document Revised: 05/05/2016 Document Reviewed: 01/02/2015 Elsevier Interactive Patient Education  2018 [employer] Carpal Tunnel Syndrome Carpal tunnel syndrome is a condition that causes pain in your hand and arm. The carpal tunnel is a narrow area that is on the palm side of your wrist. Repeated wrist motion or certain diseases may cause swelling in the tunnel. This swelling can pinch the main nerve in the wrist (median nerve). Follow these instructions at home: If you have a splint:  Wear it as told by your doctor. Remove it only as told by your doctor.  Loosen the splint if your  fingers: ? Become numb and tingle. ? Turn blue and cold.  Keep the splint clean and dry. General instructions  Take over-the-counter and prescription medicines only as told by your doctor.  Rest your wrist from any activity that may be causing your pain. If needed, talk to your employer about changes that can be made in your work, such as getting a wrist pad to use while typing.  If directed, apply ice to the painful area: ? Put ice in a plastic bag. ? Place a towel between your skin and the bag. ? Leave the ice on for 20 minutes, 2-3 times per day.  Keep all follow-up visits as told by your doctor. This is important.  Do any exercises as told by your doctor, physical therapist, or occupational therapist. Contact a doctor if:  You have new symptoms.  Medicine does not help your pain.  Your symptoms get worse. This information is not intended to replace advice given to you by your health care provider. Make sure you discuss any questions you have with your health care provider. Document Released: 08/29/2011 Document Revised: 02/15/2016 Document Reviewed: 01/25/2015 Elsevier Interactive Patient Education  [employer].

## 2018-02-24 ENCOUNTER — Encounter: Payer: Self-pay | Admitting: "Endocrinology

## 2018-02-24 ENCOUNTER — Ambulatory Visit: Payer: BLUE CROSS/BLUE SHIELD | Admitting: "Endocrinology

## 2018-02-24 VITALS — BP 133/88 | HR 65 | Ht 63.0 in | Wt 143.0 lb

## 2018-02-24 DIAGNOSIS — E890 Postprocedural hypothyroidism: Secondary | ICD-10-CM

## 2018-02-24 MED ORDER — LEVOTHYROXINE SODIUM 75 MCG PO TABS: 75.0000 ug | ORAL_TABLET | Freq: Every day | ORAL | 3 refills | Status: DC

## 2018-02-24 NOTE — Progress Notes (Signed)
Endocrinology follow-up  Note    Subjective:    Patient ID: Linda Lin, female    DOB: 1971/08/30, PCP Aliene Beams, MD.   Past Medical History:  Diagnosis Date  . Asthma   . Cellulitis   . Scabies    History reviewed. No pertinent surgical history. Social History   Socioeconomic History  . Marital status: Single    Spouse name: Not on file  . Number of children: Not on file  . Years of education: Not on file  . Highest education level: Not on file  Occupational History  . Not on file  Social Needs  . Financial resource strain: Not on file  . Food insecurity:    Worry: Not on file    Inability: Not on file  . Transportation needs:    Medical: Not on file    Non-medical: Not on file  Tobacco Use  . Smoking status: Current Every Day Smoker    Packs/day: 0.25    Years: 20.00    Pack years: 5.00    Types: Cigarettes  . Smokeless tobacco: Never Used  Substance and Sexual Activity  . Alcohol use: Yes    Comment: occ  . Drug use: Yes    Types: Marijuana    Comment: sporadically  . Sexual activity: Not Currently    Partners: Male    Birth control/protection: None  Lifestyle  . Physical activity:    Days per week: Not on file    Minutes per session: Not on file  . Stress: Not on file  Relationships  . Social connections:    Talks on phone: Not on file    Gets together: Not on file    Attends religious service: Not on file    Active member of club or organization: Not on file    Attends meetings of clubs or organizations: Not on file    Relationship status: Not on file  Other Topics Concern  . Not on file  Social History Narrative   Work At [employer]   Supervisor.    Outpatient Encounter Medications as of 02/24/2018  Medication Sig  . acetaminophen (TYLENOL) 500 MG tablet Take 1,000 mg by mouth every 6 (six) hours as needed.  . diclofenac (VOLTAREN) 75 MG EC tablet Take 1 tablet (75 mg total) by mouth 2 (two) times daily.  Marland Kitchen levothyroxine  (SYNTHROID, LEVOTHROID) 75 MCG tablet Take 1 tablet (75 mcg total) by mouth daily before breakfast.  . meclizine (ANTIVERT) 25 MG tablet Take 1 tablet (25 mg total) by mouth 3 (three) times daily as needed for dizziness.  . [DISCONTINUED] predniSONE (STERAPRED UNI-PAK 21 TAB) 10 MG (21) TBPK tablet Take as directed.  . [DISCONTINUED] propranolol (INDERAL) 20 MG tablet Take 20 mg by mouth daily.   No facility-administered encounter medications on file as of 02/24/2018.     ALLERGIES: Allergies  Allergen Reactions  . Cephalexin Rash  . Doxycycline Rash    VACCINATION STATUS:  There is no immunization history on file for this patient.   HPI  Linda Lin is 47 y.o. female who presents today with a medical history as above.  She is returning with repeat thyroid function tests after she was treated with I-131 thyroid ablation on October 16, 2017 for Graves' disease.    Her previsit thyroid function tests are consistent with treatment effect.  -She continues to feel better, no new complaints, gaining weight after she has lost approximately 80 pounds prior to her treatment with I-131.  -  She sleeps well, denies palpitations, tremors, anxiety.    her most recent thyroid labs revealed  Lab Results  Component Value Date   TSH 33.22 (H) 02/17/2018   TSH <0.01 (L) 12/15/2017   TSH 0.01 (L) 09/17/2017   TSH <0.01 (L) 06/26/2017   TSH <0.010 (L) 02/25/2017   FREET4 0.2 (L) 02/17/2018   FREET4 1.2 12/15/2017   FREET4 4.5 (H) 09/17/2017    she denies dysphagia, choking, shortness of breath, no recent voice change. she denies family history of thyroid dysfunction , denies family hx of thyroid cancer.                            Review of systems  Constitutional: +  weight gain , -+fatigue /sleepiness, - subjective hyperthermia Eyes: no blurry vision, + xerophthalmia ENT: no sore throat,  no dysphagia/odynophagia, nor hoarseness Cardiovascular: no Chest Pain, no Shortness of  Breath, - palpitations, no leg swelling Respiratory: no cough, no SOB Gastrointestinal: no Nausea, no Vomiting, no Diarhhea Musculoskeletal: no muscle/joint aches Skin: no rashes Neurological: no  tremors, no numbness, no tingling, no dizziness Psychiatric: no depression, no  anxiety   Objective:    BP 133/88   Pulse 65   Ht 5\' 3"  (1.6 m)   Wt 143 lb (64.9 kg)   BMI 25.33 kg/m   Wt Readings from Last 3 Encounters:  02/24/18 143 lb (64.9 kg)  02/20/18 141 lb (64 kg)  12/22/17 127 lb (57.6 kg)                                                Physical exam  Constitutional:  + Appropriate weight for height, not in acute distress, + stable state of mind, alert and oriented x3.   Eyes: PERRLA, EOMI, - exophthalmos ENT: moist mucous membranes, +  Thyromegaly with bruit, no cervical lymphadenopathy Cardiovascular: Stable precordial activity, no tachycardia.   Musculoskeletal: no gross deformities, strength intact in all four extremities Skin: moist, warm, no rashes Neurological: -  tremor with outstretched hands   CMP     Component Value Date/Time   NA 138 07/11/2017 0824   K 3.9 07/11/2017 0824   CL 108 07/11/2017 0824   CO2 23 07/11/2017 0824   GLUCOSE 102 (H) 07/11/2017 0824   BUN 16 07/11/2017 0824   CREATININE 0.37 (L) 07/11/2017 0824   CREATININE 0.33 (L) 06/26/2017 1016   CALCIUM 9.5 07/11/2017 0824   PROT 7.3 06/26/2017 1016   ALBUMIN 3.8 02/25/2017 0645   AST 18 06/26/2017 1016   ALT 17 06/26/2017 1016   ALKPHOS 126 02/25/2017 0645   BILITOT 0.4 06/26/2017 1016   GFRNONAA >60 07/11/2017 0824   GFRNONAA 132 06/26/2017 1016   GFRAA >60 07/11/2017 0824   GFRAA 153 06/26/2017 1016     CBC    Component Value Date/Time   WBC 3.8 (L) 07/11/2017 0824   RBC 4.12 07/11/2017 0824   HGB 11.8 (L) 07/11/2017 0824   HCT 36.9 07/11/2017 0824   PLT 195 07/11/2017 0824   MCV 89.6 07/11/2017 0824   MCH 28.6 07/11/2017 0824   MCHC 32.0 07/11/2017 0824   RDW 13.2  07/11/2017 0824   LYMPHSABS 2.4 07/11/2017 0824   MONOABS 0.6 07/11/2017 0824   EOSABS 0.0 07/11/2017 0824   BASOSABS 0.0 07/11/2017 1610  Lab Results  Component Value Date   TSH 33.22 (H) 02/17/2018   TSH <0.01 (L) 12/15/2017   TSH 0.01 (L) 09/17/2017   TSH <0.01 (L) 06/26/2017   TSH <0.010 (L) 02/25/2017   FREET4 0.2 (L) 02/17/2018   FREET4 1.2 12/15/2017   FREET4 4.5 (H) 09/17/2017      Assessment & Plan:   1. Hyperthyroidism 2. Graves' disease  -She is status post radioactive iodine ablation on October 16, 2017 for Graves' disease.  Her previsit thyroid function tests are consistent with treatment effect . -I discussed and initiated thyroid hormone replacement with levothyroxine 75 mcg p.o. nightly.  She may need a higher dose on subsequent visits.    - We discussed about correct intake of levothyroxine, at fasting, with water, separated by at least 30 minutes from breakfast, and separated by more than 4 hours from calcium, iron, multivitamins, acid reflux medications (PPIs). -Patient is made aware of the fact that thyroid hormone replacement is needed for life, dose to be adjusted by periodic monitoring of thyroid function tests.   - I advised her to discontinue propanolol.   - I advised her to maintain close follow up with Aliene Beams, MD for primary care needs.  Follow up plan: Return in about 3 months (around 05/27/2018) for follow up with pre-visit labs.  Thank you for involving me in the care of this pleasant patient, and I will continue to update you with her progress.  Marquis Lunch, MD [facility] Phone: [phone]  Fax: [phone]   02/24/2018, 12:46 PM  This note was partially dictated with voice recognition software. Similar sounding words can be transcribed inadequately or may not  be corrected upon review.

## 2018-02-27 ENCOUNTER — Encounter: Payer: Self-pay | Admitting: Family Medicine

## 2018-03-11 ENCOUNTER — Encounter (INDEPENDENT_AMBULATORY_CARE_PROVIDER_SITE_OTHER): Payer: Self-pay | Admitting: Orthopaedic Surgery

## 2018-03-11 ENCOUNTER — Ambulatory Visit (INDEPENDENT_AMBULATORY_CARE_PROVIDER_SITE_OTHER): Payer: BLUE CROSS/BLUE SHIELD | Admitting: Orthopaedic Surgery

## 2018-03-11 ENCOUNTER — Ambulatory Visit (INDEPENDENT_AMBULATORY_CARE_PROVIDER_SITE_OTHER): Payer: Self-pay

## 2018-03-11 VITALS — BP 142/84 | HR 69 | Ht 63.0 in | Wt 141.0 lb

## 2018-03-11 DIAGNOSIS — M25511 Pain in right shoulder: Secondary | ICD-10-CM

## 2018-03-11 DIAGNOSIS — M542 Cervicalgia: Secondary | ICD-10-CM | POA: Diagnosis not present

## 2018-03-11 DIAGNOSIS — G8929 Other chronic pain: Secondary | ICD-10-CM | POA: Diagnosis not present

## 2018-03-11 NOTE — Progress Notes (Signed)
Office Visit Note   Patient: Linda Lin           Date of Birth: 11-15-1970           MRN: [id number] Visit Date: 03/11/2018              Requested by: Aliene Beams, MD [address] [place], [place] [address] PCP: Aliene Beams, MD   Assessment & Plan: Visit Diagnoses:  1. Chronic right shoulder pain   2. Cervicalgia     Plan: Most of Linda Lin's pain Linda localized along the supraspinatus muscle right shoulder.  I think the problem Linda referred from the cervical spine rather than the shoulder.  Will suggest a course of physical therapy at [facility].  Continue with diclofenac per her primary care physician aloe up 1 month Follow-Up Instructions: Return if symptoms worsen or fail to improve.   Orders:  Orders Placed This Encounter  Procedures  . XR Shoulder Right  . XR Cervical Spine 2 or 3 views  . Ambulatory referral to Physical Therapy   No orders of the defined types were placed in this encounter.     Procedures: No procedures performed   Clinical Data: No additional findings.   Subjective: Chief Complaint  Patient presents with  . Right Shoulder - Pain  . New Patient (Initial Visit)    R SHOULDER PAIN FOR FEW YEARS NO INJURY, NO INJECTIONS OR X RAYS  Linda Lin visits the office for evaluation of right shoulder pain over the past several years. She owned a pressure washing business and  thinks that with repetitive activity that  may have initiated her shoulder pain. Dis Comfort Linda experienced along the superior aspect of her right shoulder.  Has a little bit of "neck pain but no related numbness or tingling.  Has not had much trouble raising her arm over her head.  No specific injury or trauma.  Her primary care physician recently prescribed diclofenac which has helped  HPI  Review of Systems  Constitutional: Positive for fatigue. Negative for fever.  HENT: Negative for ear pain.   Eyes: Negative for pain.  Respiratory: Positive for  cough and shortness of breath.   Cardiovascular: Positive for leg swelling.  Gastrointestinal: Positive for constipation. Negative for diarrhea.  Genitourinary: Negative for difficulty urinating.  Musculoskeletal: Positive for neck pain. Negative for back pain.  Skin: Negative for rash.  Allergic/Immunologic: Negative for food allergies.  Neurological: Positive for weakness and numbness.  Hematological: Bruises/bleeds easily.  Psychiatric/Behavioral: Positive for sleep disturbance.     Objective: Vital Signs: BP (!) 142/84 (BP Location: Left Arm, Patient Position: Sitting, Cuff Size: Normal)   Pulse 69   Ht 5\' 3"  (1.6 m)   Wt 141 lb (64 kg)   BMI 24.98 kg/m   Physical Exam  Constitutional: She Linda oriented to person, place, and time. She appears well-developed and well-nourished.  HENT:  Mouth/Throat: Oropharynx Linda clear and moist.  Eyes: Pupils are equal, round, and reactive to light. EOM are normal.  Pulmonary/Chest: Effort normal.  Neurological: She Linda alert and oriented to person, place, and time.  Skin: Skin Linda warm and dry.  Psychiatric: She has a normal mood and affect. Her behavior Linda normal.    Ortho Exam awake alert and oriented x3.  Comfortable sitting.  Painless range of motion of right shoulder in the impingement position.  Negative empty can testing.  No pain along the anterior or lateral subacromial region or the  acromioclavicular joint.  Biceps intact.  Skin intact.  Neurovascular exam intact.  Pain Linda localized along the supraspinatus muscle.  It did feel a little bit tight but not different from the left.  Some mild loss of cervical spine motion particularly in extension.  Had some referred pain to the supraspinatus with that motion.  No difficulty with rotation to the right or to the left.  No change in pulses with her arm elevated and with her neck rotating to both right and to the left sides.  Good grip and release.  Specialty Comments:  No specialty comments  available.  Imaging: Xr Cervical Spine 2 Or 3 Views  Result Date: 03/11/2018 Of the cervical spine were obtained in the AP and lateral projection.  Normal cervical lordosis.  Some degenerative changes in the mid cervical spine clarity at the disc spaces at C3-4, C4-5 and C5-6.  No listhesis.  On the AP there may be a small cervical rib bilaterally  Xr Shoulder Right  Result Date: 03/11/2018 Films of the right shoulder obtained in 2 projections.  The humeral head Linda centered about the glenoid normal space between the humeral head and the acromion.  No obvious d degenerative changes of the acromioclavicular joint.  Looks like a type 1- 2 acromium.  Possibly small cystic changes within the glenoid but no obvious degenerative arthritis Thi  t    PMFS History: Patient Active Problem List   Diagnosis Date Noted  . Hypothyroidism following radioiodine therapy 02/24/2018  . Graves' disease 09/19/2017  . Contact dermatitis 12/21/2015  . Bilateral lower leg cellulitis, recurrent 12/19/2015  . Lower extremity edema 12/19/2015  . History of asthma, mild 12/19/2015  . Maculopapular rash, generalized 08/30/2015  . Tobacco abuse 08/30/2015  . Cellulitis of both lower extremities 08/27/2015   Past Medical History:  Diagnosis Date  . Asthma   . Cellulitis   . Scabies   . Thyroid disorder     Family History  Problem Relation Age of Onset  . Asthma Mother   . Diabetes Father   . Kidney failure Father   . Hypertension Sister   . Diabetes Mellitus II Brother   . Cancer Neg Hx     History reviewed. No pertinent surgical history. Social History   Occupational History  . Not on file  Tobacco Use  . Smoking status: Current Every Day Smoker    Packs/day: 0.25    Years: 20.00    Pack years: 5.00    Types: Cigarettes  . Smokeless tobacco: Never Used  Substance and Sexual Activity  . Alcohol use: Yes    Comment: occ  . Drug use: Yes    Types: Marijuana    Comment: sporadically  .  Sexual activity: Not Currently    Partners: Male    Birth control/protection: None

## 2018-03-27 ENCOUNTER — Ambulatory Visit: Payer: BLUE CROSS/BLUE SHIELD | Admitting: Family Medicine

## 2018-05-28 ENCOUNTER — Ambulatory Visit: Payer: BLUE CROSS/BLUE SHIELD | Admitting: "Endocrinology

## 2018-06-08 ENCOUNTER — Emergency Department (HOSPITAL_COMMUNITY): Payer: BLUE CROSS/BLUE SHIELD

## 2018-06-08 ENCOUNTER — Emergency Department (HOSPITAL_COMMUNITY)
Admission: EM | Admit: 2018-06-08 | Discharge: 2018-06-08 | Disposition: A | Discharge status: Home / Self Care | Payer: BLUE CROSS/BLUE SHIELD | Attending: Emergency Medicine | Admitting: Emergency Medicine

## 2018-06-08 ENCOUNTER — Other Ambulatory Visit: Payer: Self-pay

## 2018-06-08 ENCOUNTER — Encounter (HOSPITAL_COMMUNITY): Payer: Self-pay | Admitting: Emergency Medicine

## 2018-06-08 DIAGNOSIS — I10 Essential (primary) hypertension: Secondary | ICD-10-CM | POA: Diagnosis not present

## 2018-06-08 DIAGNOSIS — E039 Hypothyroidism, unspecified: Secondary | ICD-10-CM | POA: Insufficient documentation

## 2018-06-08 DIAGNOSIS — M79644 Pain in right finger(s): Secondary | ICD-10-CM | POA: Diagnosis not present

## 2018-06-08 DIAGNOSIS — Z79899 Other long term (current) drug therapy: Secondary | ICD-10-CM | POA: Insufficient documentation

## 2018-06-08 DIAGNOSIS — S6991XA Unspecified injury of right wrist, hand and finger(s), initial encounter: Secondary | ICD-10-CM | POA: Diagnosis not present

## 2018-06-08 DIAGNOSIS — J45909 Unspecified asthma, uncomplicated: Secondary | ICD-10-CM | POA: Insufficient documentation

## 2018-06-08 DIAGNOSIS — M25551 Pain in right hip: Secondary | ICD-10-CM | POA: Insufficient documentation

## 2018-06-08 DIAGNOSIS — M79651 Pain in right thigh: Secondary | ICD-10-CM | POA: Insufficient documentation

## 2018-06-08 DIAGNOSIS — M79604 Pain in right leg: Secondary | ICD-10-CM | POA: Diagnosis not present

## 2018-06-08 DIAGNOSIS — R52 Pain, unspecified: Secondary | ICD-10-CM | POA: Diagnosis not present

## 2018-06-08 DIAGNOSIS — F17210 Nicotine dependence, cigarettes, uncomplicated: Secondary | ICD-10-CM | POA: Insufficient documentation

## 2018-06-08 DIAGNOSIS — R109 Unspecified abdominal pain: Secondary | ICD-10-CM | POA: Insufficient documentation

## 2018-06-08 DIAGNOSIS — S3991XA Unspecified injury of abdomen, initial encounter: Secondary | ICD-10-CM | POA: Diagnosis not present

## 2018-06-08 DIAGNOSIS — S8991XA Unspecified injury of right lower leg, initial encounter: Secondary | ICD-10-CM | POA: Diagnosis not present

## 2018-06-08 DIAGNOSIS — M79641 Pain in right hand: Secondary | ICD-10-CM | POA: Diagnosis not present

## 2018-06-08 DIAGNOSIS — S79911A Unspecified injury of right hip, initial encounter: Secondary | ICD-10-CM | POA: Diagnosis not present

## 2018-06-08 DIAGNOSIS — S79921A Unspecified injury of right thigh, initial encounter: Secondary | ICD-10-CM | POA: Diagnosis not present

## 2018-06-08 LAB — CBC
HEMATOCRIT: 41 % (ref 36.0–46.0)
Hemoglobin: 14 g/dL (ref 12.0–15.0)
MCH: 34.9 pg — AB (ref 26.0–34.0)
MCHC: 34.1 g/dL (ref 30.0–36.0)
MCV: 102.2 fL — ABNORMAL HIGH (ref 78.0–100.0)
PLATELETS: 281 10*3/uL (ref 150–400)
RBC: 4.01 MIL/uL (ref 3.87–5.11)
RDW: 12 % (ref 11.5–15.5)
WBC: 5.6 10*3/uL (ref 4.0–10.5)

## 2018-06-08 LAB — I-STAT BETA HCG BLOOD, ED (MC, WL, AP ONLY)

## 2018-06-08 LAB — BASIC METABOLIC PANEL
Anion gap: 8 (ref 5–15)
BUN: 15 mg/dL (ref 6–20)
CHLORIDE: 105 mmol/L (ref 98–111)
CO2: 23 mmol/L (ref 22–32)
CREATININE: 1.16 mg/dL — AB (ref 0.44–1.00)
Calcium: 8.7 mg/dL — ABNORMAL LOW (ref 8.9–10.3)
GFR, EST NON AFRICAN AMERICAN: 55 mL/min — AB (ref >60)
Glucose, Bld: 109 mg/dL — ABNORMAL HIGH (ref 70–99)
POTASSIUM: 3.8 mmol/L (ref 3.5–5.1)
SODIUM: 136 mmol/L (ref 135–145)

## 2018-06-08 MED ORDER — CYCLOBENZAPRINE HCL 10 MG PO TABS: 10.0000 mg | ORAL_TABLET | Freq: Two times a day (BID) | ORAL | 0 refills | Status: DC | PRN

## 2018-06-08 MED ORDER — IOPAMIDOL (ISOVUE-300) INJECTION 61%
100.0000 mL | Freq: Once | INTRAVENOUS | Status: AC | PRN
  Administered 2018-06-08: 100 mL via INTRAVENOUS

## 2018-06-08 MED ORDER — NAPROXEN 500 MG PO TABS: 500.0000 mg | ORAL_TABLET | Freq: Two times a day (BID) | ORAL | 0 refills | Status: DC | PRN

## 2018-06-08 MED ORDER — MORPHINE SULFATE (PF) 4 MG/ML IV SOLN
4.0000 mg | Freq: Once | INTRAVENOUS | Status: AC
  Administered 2018-06-08: 4 mg via INTRAVENOUS
  Filled 2018-06-08: qty 1
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
## 2018-06-08 NOTE — Discharge Instructions (Addendum)
You can expect to be stiff and sore for the next week.  Take the pain medication and muscle relaxant as needed.

## 2018-06-30 ENCOUNTER — Other Ambulatory Visit: Payer: Self-pay

## 2018-06-30 MED ORDER — LEVOTHYROXINE SODIUM 75 MCG PO TABS: 75.0000 ug | ORAL_TABLET | Freq: Every day | ORAL | 3 refills | Status: DC

## 2018-10-24 IMAGING — NM NM THYROID IMAGING W/ UPTAKE MULTI (4&24 HR)
4 series · 4 of 4 positions shown · non-contrast
Comparison: Thyroid ultrasound 07/04/2017

CLINICAL DATA: Clinical hyperthyroidism.

EXAM:
THYROID SCAN AND UPTAKE - 4 AND 24 HOURS
TECHNIQUE: Following oral administration of I 123 capsule, anterior planar
imaging was acquired at 24 hours. Thyroid uptake was calculated with
a thyroid probe at 4-6 hours and 24 hours.
RADIOPHARMACEUTICALS:  448 uCi I -123

[Series 1: anterior · 1.18mm/px · 1 of 1 slices shown]
[im 1/1  full-range]
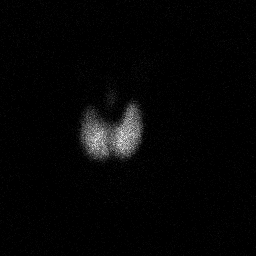

[Series 2: ant w marker · 1.18mm/px · 1 of 1 slices shown]
[im 1/1  full-range]
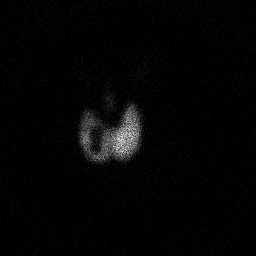

[Series 3: lao · 1.18mm/px · 1 of 1 slices shown]
[im 1/1  full-range]
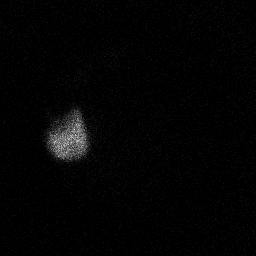

[Series 4: rao · 1.18mm/px · 1 of 1 slices shown]
[im 1/1  full-range]
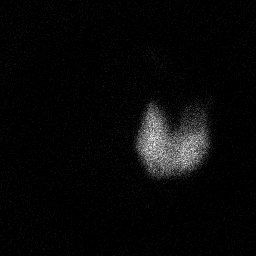

[4 of 4 positions shown; findings below may reference images not displayed]

FINDINGS: Symmetric uptake in a slightly enlarged thyroid gland. No hot or
cold lesions are identified..

4 hour I 123 uptake = 28.8% (normal 5-20%)

24 hour I 123 uptake = 37.3% (normal 10-30%)
IMPRESSION: 1. Findings consistent with Graves disease with elevated 4 and 24
hour I 123 uptake.
2. No hot or cold thyroid lesions are identified.

## 2019-02-10 ENCOUNTER — Emergency Department (HOSPITAL_COMMUNITY): Payer: Self-pay

## 2019-02-10 ENCOUNTER — Encounter (HOSPITAL_COMMUNITY): Payer: Self-pay | Admitting: Emergency Medicine

## 2019-02-10 ENCOUNTER — Other Ambulatory Visit: Payer: Self-pay

## 2019-02-10 ENCOUNTER — Emergency Department (HOSPITAL_COMMUNITY)
Admission: EM | Admit: 2019-02-10 | Discharge: 2019-02-11 | Disposition: A | Discharge status: Home / Self Care | Payer: Self-pay | Attending: Emergency Medicine | Admitting: Emergency Medicine

## 2019-02-10 DIAGNOSIS — K5900 Constipation, unspecified: Secondary | ICD-10-CM | POA: Diagnosis not present

## 2019-02-10 DIAGNOSIS — J209 Acute bronchitis, unspecified: Secondary | ICD-10-CM | POA: Diagnosis not present

## 2019-02-10 DIAGNOSIS — F17210 Nicotine dependence, cigarettes, uncomplicated: Secondary | ICD-10-CM | POA: Insufficient documentation

## 2019-02-10 DIAGNOSIS — E039 Hypothyroidism, unspecified: Secondary | ICD-10-CM | POA: Insufficient documentation

## 2019-02-10 DIAGNOSIS — Z79899 Other long term (current) drug therapy: Secondary | ICD-10-CM | POA: Insufficient documentation

## 2019-02-10 DIAGNOSIS — R062 Wheezing: Secondary | ICD-10-CM | POA: Insufficient documentation

## 2019-02-10 DIAGNOSIS — R0602 Shortness of breath: Secondary | ICD-10-CM | POA: Diagnosis not present

## 2019-02-10 LAB — COMPREHENSIVE METABOLIC PANEL
ALT: 17 U/L (ref 0–44)
AST: 34 U/L (ref 15–41)
Albumin: 4.6 g/dL (ref 3.5–5.0)
Alkaline Phosphatase: 58 U/L (ref 38–126)
Anion gap: 10 (ref 5–15)
BUN: 12 mg/dL (ref 6–20)
CO2: 26 mmol/L (ref 22–32)
Calcium: 9 mg/dL (ref 8.9–10.3)
Chloride: 100 mmol/L (ref 98–111)
Creatinine, Ser: 1.16 mg/dL — ABNORMAL HIGH (ref 0.44–1.00)
GFR calc Af Amer: >60 mL/min (ref >60)
GFR calc non Af Amer: 55 mL/min — ABNORMAL LOW (ref >60)
Glucose, Bld: 83 mg/dL (ref 70–99)
Potassium: 4.2 mmol/L (ref 3.5–5.1)
Sodium: 136 mmol/L (ref 135–145)
Total Bilirubin: 0.6 mg/dL (ref 0.3–1.2)
Total Protein: 7.9 g/dL (ref 6.5–8.1)

## 2019-02-10 LAB — CBC WITH DIFFERENTIAL/PLATELET
Abs Immature Granulocytes: 0.02 10*3/uL (ref 0.00–0.07)
Basophils Absolute: 0 10*3/uL (ref 0.0–0.1)
Basophils Relative: 1 %
Eosinophils Absolute: 0.1 10*3/uL (ref 0.0–0.5)
Eosinophils Relative: 3 %
HCT: 35.8 % — ABNORMAL LOW (ref 36.0–46.0)
Hemoglobin: 11.9 g/dL — ABNORMAL LOW (ref 12.0–15.0)
Immature Granulocytes: 0 %
Lymphocytes Relative: 40 %
Lymphs Abs: 2.1 10*3/uL (ref 0.7–4.0)
MCH: 34.8 pg — ABNORMAL HIGH (ref 26.0–34.0)
MCHC: 33.2 g/dL (ref 30.0–36.0)
MCV: 104.7 fL — ABNORMAL HIGH (ref 80.0–100.0)
Monocytes Absolute: 0.5 10*3/uL (ref 0.1–1.0)
Monocytes Relative: 9 %
Neutro Abs: 2.5 10*3/uL (ref 1.7–7.7)
Neutrophils Relative %: 47 %
Platelets: 251 10*3/uL (ref 150–400)
RBC: 3.42 MIL/uL — ABNORMAL LOW (ref 3.87–5.11)
RDW: 13.4 % (ref 11.5–15.5)
WBC: 5.2 10*3/uL (ref 4.0–10.5)
nRBC: 0 % (ref 0.0–0.2)

## 2019-02-10 LAB — BRAIN NATRIURETIC PEPTIDE: B Natriuretic Peptide: 29 pg/mL (ref 0.0–100.0)

## 2019-02-10 LAB — TSH: TSH: 57.266 u[IU]/mL — ABNORMAL HIGH (ref 0.350–4.500)

## 2019-02-10 MED ORDER — ALBUTEROL SULFATE HFA 108 (90 BASE) MCG/ACT IN AERS
2.0000 | INHALATION_SPRAY | Freq: Once | RESPIRATORY_TRACT | Status: AC
  Administered 2019-02-10: 2 via RESPIRATORY_TRACT
  Filled 2019-02-10: qty 6.7

## 2019-02-10 MED ORDER — PREDNISONE 50 MG PO TABS
60.0000 mg | ORAL_TABLET | Freq: Once | ORAL | Status: AC
  Administered 2019-02-10: 60 mg via ORAL
  Filled 2019-02-10: qty 1

## 2019-02-10 MED ORDER — PREDNISONE 50 MG PO TABS: ORAL_TABLET | ORAL | 0 refills | Status: DC

## 2019-02-10 MED ORDER — LEVOTHYROXINE SODIUM 75 MCG PO TABS: 75.0000 ug | ORAL_TABLET | Freq: Every day | ORAL | 0 refills | Status: DC

## 2019-02-10 NOTE — ED Triage Notes (Signed)
Pt c/o bilateral leg, ankle and foot swelling, bilateral arms and hands swelling, SOB, chest congestion and decreased urinary output-all x 2 mos, pt reports she has not seen her PCP and is here tonight bc she is now worried

## 2019-02-10 NOTE — ED Notes (Signed)
Pt unable to provide urine specimen at this time

## 2019-02-10 NOTE — ED Provider Notes (Signed)
[facility] EMERGENCY DEPARTMENT Provider Note   CSN: [id number] Arrival date & time: 02/10/19  2028    History   Chief Complaint Chief Complaint  Patient presents with  . Leg Swelling    HPI Linda Lin is a 48 y.o. female with a history of asthma and hypothyroidism secondary to Graves disease (ran out of her levothyroxine 12/19) presenting with a 2 month history of multiple complaints including peripheral edema, decreased energy, decreased urination (but also endorses has been not real motivated to maintain hydration) in association with cough, wheezing and shortness of breath which has been worsening the past several weeks. She denies fevers or chills, cough has been nonproductive. She denies chest pain, n/v abdominal pain. Endorses constipation.  Her swelling has been in her hands, feet, worsened after a 12 hour shift at work, but has noticed when she wakes up the dependent 1/2 of her face even feels puffy for awhile.  She is a 1 ppd smoker but has cut back since her wheezing began. She denies orthopnea but has sob with exertion. No hx of CHF.      The history is provided by the patient.    Past Medical History:  Diagnosis Date  . Asthma   . Cellulitis   . Scabies   . Thyroid disorder     Patient Active Problem List   Diagnosis Date Noted  . Hypothyroidism following radioiodine therapy 02/24/2018  . Graves' disease 09/19/2017  . Contact dermatitis 12/21/2015  . Bilateral lower leg cellulitis, recurrent 12/19/2015  . Lower extremity edema 12/19/2015  . History of asthma, mild 12/19/2015  . Maculopapular rash, generalized 08/30/2015  . Tobacco abuse 08/30/2015  . Cellulitis of both lower extremities 08/27/2015    History reviewed. No pertinent surgical history.   OB History    Gravida  0   Para  0   Term  0   Preterm  0   AB  0   Living  0     SAB  0   TAB  0   Ectopic  0   Multiple  0   Live Births               Home Medications     Prior to Admission medications   Medication Sig Start Date End Date Taking? Authorizing Provider  acetaminophen (TYLENOL) 500 MG tablet Take 500 mg by mouth every 6 (six) hours as needed for mild pain or moderate pain.    Yes [provider]  levothyroxine (SYNTHROID) 75 MCG tablet Take 1 tablet (75 mcg total) by mouth daily before breakfast for 30 doses. 02/10/19 03/12/19  Burgess Amor, PA-C  predniSONE (DELTASONE) 50 MG tablet Take one tablet daily for 5 days. 02/10/19   Burgess Amor, PA-C    Family History Family History  Problem Relation Age of Onset  . Asthma Mother   . Diabetes Father   . Kidney failure Father   . Hypertension Sister   . Diabetes Mellitus II Brother   . Cancer Neg Hx     Social History Social History   Tobacco Use  . Smoking status: Current Every Day Smoker    Packs/day: 0.25    Years: 20.00    Pack years: 5.00    Types: Cigarettes  . Smokeless tobacco: Never Used  Substance Use Topics  . Alcohol use: Yes    Comment: occ  . Drug use: Not Currently    Types: Marijuana    Comment: sporadically-last used sept  2019     Allergies   Cephalexin and Doxycycline   Review of Systems Review of Systems  Constitutional: Positive for fatigue. Negative for diaphoresis and fever.  HENT: Negative for congestion and sore throat.   Eyes: Negative.   Respiratory: Positive for cough, shortness of breath and wheezing. Negative for chest tightness.   Cardiovascular: Positive for leg swelling. Negative for chest pain and palpitations.       Dependent face, hands and legs swelling.  Gastrointestinal: Positive for constipation. Negative for abdominal pain, nausea and vomiting.  Genitourinary: Negative.  Negative for decreased urine volume.  Musculoskeletal: Negative for arthralgias, joint swelling and neck pain.  Skin: Negative.  Negative for color change, rash and wound.  Neurological: Negative for dizziness, weakness, light-headedness, numbness and  headaches.  Psychiatric/Behavioral: Negative.      Physical Exam Updated Vital Signs BP (!) 126/96 (BP Location: Left Arm)   Pulse 85   Temp 98.3 F (36.8 C) (Oral)   Resp 17   Ht 5\' 3"  (1.6 m)   Wt 72.6 kg   LMP 01/30/2019   SpO2 97%   BMI 28.34 kg/m   Physical Exam Vitals signs and nursing note reviewed.  Constitutional:      Appearance: She is well-developed.  HENT:     Head: Normocephalic and atraumatic.     Mouth/Throat:     Mouth: Mucous membranes are moist.     Pharynx: No posterior oropharyngeal erythema.  Eyes:     Conjunctiva/sclera: Conjunctivae normal.  Neck:     Musculoskeletal: Normal range of motion and neck supple.  Cardiovascular:     Rate and Rhythm: Normal rate and regular rhythm.     Heart sounds: Normal heart sounds.  Pulmonary:     Effort: Prolonged expiration present. No tachypnea, accessory muscle usage or retractions.     Breath sounds: Decreased air movement present. Wheezing present. No rhonchi or rales.  Abdominal:     General: Bowel sounds are normal.     Palpations: Abdomen is soft.     Tenderness: There is no abdominal tenderness.  Musculoskeletal: Normal range of motion.        General: No swelling or tenderness.     Right lower leg: No edema.     Left lower leg: No edema.     Comments: No appreciable peripheral edema on tonights exam  Skin:    General: Skin is warm and dry.     Findings: No rash.  Neurological:     General: No focal deficit present.     Mental Status: She is alert.      ED Treatments / Results  Labs (all labs ordered are listed, but only abnormal results are displayed) Labs Reviewed  CBC WITH DIFFERENTIAL/PLATELET - Abnormal; Notable for the following components:      Result Value   RBC 3.42 (*)    Hemoglobin 11.9 (*)    HCT 35.8 (*)    MCV 104.7 (*)    MCH 34.8 (*)    All other components within normal limits  COMPREHENSIVE METABOLIC PANEL - Abnormal; Notable for the following components:    Creatinine, Ser 1.16 (*)    GFR calc non Af Amer 55 (*)    All other components within normal limits  TSH - Abnormal; Notable for the following components:   TSH 57.266 (*)    All other components within normal limits  BRAIN NATRIURETIC PEPTIDE  POC URINE PREG, ED    EKG None  Radiology Dg  Chest Portable 1 View  Result Date: 02/10/2019 CLINICAL DATA:  Short of breath, wheezing EXAM: PORTABLE CHEST 1 VIEW COMPARISON:  02/25/2017 FINDINGS: Mild cardiomegaly. No focal opacity or pleural effusion. No pneumothorax IMPRESSION: No active disease.  Mild cardiomegaly Electronically Signed   By: Jasmine Pang M.D.   On: 02/10/2019 22:05    Procedures Procedures (including critical care time)  Medications Ordered in ED Medications  albuterol (VENTOLIN HFA) 108 (90 Base) MCG/ACT inhaler 2 puff (2 puffs Inhalation Given 02/10/19 2142)  predniSONE (DELTASONE) tablet 60 mg (60 mg Oral Given 02/10/19 2141)     Initial Impression / Assessment and Plan / ED Course  I have reviewed the triage vital signs and the nursing notes.  Pertinent labs & imaging results that were available during my care of the patient were reviewed by me and considered in my medical decision making (see chart for details).        Pt was given albuterol mdi (no spacer used initially) but with moderate improvement in wheezing with improved aeration. Still with tightness, wheezing at bases. Given anther puff using a spacer with sig improvement. Ambulated without desaturation or sob.  Prednisone started here with pulse dosing prescribed.  Pt was also placed back on her levothyoxine, was on 75 mcg last fall, started this back. Pt endorses she is scheduled to see Dr Fransico Him on June 22. Encouraged to keep this appt.  Discussed smoking cessation. Return precautions discussed.   Final Clinical Impressions(s) / ED Diagnoses   Final diagnoses:  Acute bronchitis, unspecified organism  Hypothyroidism, unspecified type    ED  Discharge Orders         Ordered    predniSONE (DELTASONE) 50 MG tablet     02/10/19 2315    levothyroxine (SYNTHROID) 75 MCG tablet  Daily before breakfast     02/10/19 2315           Burgess Amor, PA-C 02/11/19 0035    Vanetta Mulders, MD 02/13/19 [phone]

## 2019-02-10 NOTE — Discharge Instructions (Addendum)
Take your next dose of prednisone tomorrow evening. Use the inhaler given taking 2 puffs every 4 hours if needed for wheezing or shortness of breath.  Your lab tests have ruled out your kidneys, your liver as being the reason for your peripheral swelling and other complaints as these labs are ok.  Your TSH (thyroid stimulating hormone) is very high which means you need to be on thyroid medicine which was prescribed for you.  Treating your hypothyroidism will help you feel a lot better.  Your lung exam is consistent with bronchitis which should get better with prednisone and the inhaler medication. Return here if you develop any worsening symptoms.  Take 2 puffs of your inhaler every 4 hours if needed for wheezing or shortness of breath.   [facility]  [address] [place], [place] [address] [phone]  Services [facility] offers a variety of basic health services.  Services include but are not limited to: Blood pressure checks  Heart rate checks  Blood sugar checks  Urine analysis  Rapid strep tests  Pregnancy tests.  Health education and referrals  People needing more complex services will be directed to a physician online. Using these virtual visits, doctors can evaluate and prescribe medicine and treatments. There will be no medication on-site, though [facility] will help patients fill their prescriptions at little to no cost.   For More information please go to: [email or web address]

## 2019-02-11 NOTE — ED Notes (Signed)
Patient's O2 sats maintained at 96-98 percent on room air. Patient states minimum shortness of breath but states she feels a lot better.

## 2019-03-08 ENCOUNTER — Telehealth: Payer: Self-pay

## 2019-03-08 DIAGNOSIS — E890 Postprocedural hypothyroidism: Secondary | ICD-10-CM | POA: Diagnosis not present

## 2019-03-08 DIAGNOSIS — E0590 Thyrotoxicosis, unspecified without thyrotoxic crisis or storm: Secondary | ICD-10-CM | POA: Diagnosis not present

## 2019-03-08 NOTE — Telephone Encounter (Signed)
Linda Lin, CMA  

## 2019-03-09 LAB — T4, FREE: Free T4: 0.9 ng/dL (ref 0.8–1.8)

## 2019-03-15 ENCOUNTER — Encounter: Payer: Self-pay | Admitting: "Endocrinology

## 2019-03-15 ENCOUNTER — Other Ambulatory Visit: Payer: Self-pay

## 2019-03-15 ENCOUNTER — Ambulatory Visit (INDEPENDENT_AMBULATORY_CARE_PROVIDER_SITE_OTHER): Payer: BC Managed Care – PPO | Admitting: "Endocrinology

## 2019-03-15 VITALS — BP 126/87 | HR 86 | Ht 63.0 in | Wt 155.0 lb

## 2019-03-15 DIAGNOSIS — E890 Postprocedural hypothyroidism: Secondary | ICD-10-CM | POA: Diagnosis not present

## 2019-03-15 MED ORDER — LEVOTHYROXINE SODIUM 75 MCG PO TABS: 75.0000 ug | ORAL_TABLET | Freq: Every day | ORAL | 1 refills | Status: DC

## 2019-03-15 NOTE — Progress Notes (Signed)
Endocrinology follow-up  Note    Subjective:    Patient ID: Linda Lin, female    DOB: 1971/04/14, PCP Patient, No Pcp Per.   Past Medical History:  Diagnosis Date  . Asthma   . Cellulitis   . Scabies   . Thyroid disorder    History reviewed. No pertinent surgical history. Social History   Socioeconomic History  . Marital status: Single    Spouse name: Not on file  . Number of children: Not on file  . Years of education: Not on file  . Highest education level: Not on file  Occupational History  . Not on file  Social Needs  . Financial resource strain: Not on file  . Food insecurity    Worry: Not on file    Inability: Not on file  . Transportation needs    Medical: Not on file    Non-medical: Not on file  Tobacco Use  . Smoking status: Current Every Day Smoker    Packs/day: 0.25    Years: 20.00    Pack years: 5.00    Types: Cigarettes  . Smokeless tobacco: Never Used  Substance and Sexual Activity  . Alcohol use: Yes    Comment: occ  . Drug use: Not Currently    Types: Marijuana    Comment: sporadically-last used sept 2019  . Sexual activity: Not Currently    Partners: Male    Birth control/protection: None  Lifestyle  . Physical activity    Days per week: Not on file    Minutes per session: Not on file  . Stress: Not on file  Relationships  . Social Musicianconnections    Talks on phone: Not on file    Gets together: Not on file    Attends religious service: Not on file    Active member of club or organization: Not on file    Attends meetings of clubs or organizations: Not on file    Relationship status: Not on file  Other Topics Concern  . 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    Outpatient Encounter Medications as of 03/15/2019  Medication Sig  . acetaminophen (TYLENOL) 500 MG tablet Take 500 mg by mouth every 6 (six) hours as needed for mild pain or moderate pain.   Marland Kitchen. levothyroxine (SYNTHROID) 75 MCG  tablet Take 1 tablet (75 mcg total) by mouth daily before breakfast for 30 doses.  . predniSONE (DELTASONE) 50 MG tablet Take one tablet daily for 5 days.  . [DISCONTINUED] levothyroxine (SYNTHROID) 75 MCG tablet Take 1 tablet (75 mcg total) by mouth daily before breakfast for 30 doses.   No facility-administered encounter medications on file as of 03/15/2019.     ALLERGIES: Allergies  Allergen Reactions  . Cephalexin Rash  . Doxycycline Rash    VACCINATION STATUS:  There is no immunization history on file for this patient.   HPI  Linda Lin is 48 y.o. female who presents today with a medical history as above.  She is returning with repeat thyroid function tests after she was treated with I-131 thyroid ablation on October 16, 2017 for Graves' disease.   Her previsit thyroid function tests are consistent with inconsistent thyroid hormone intake.  Her most recent free T4 was 0.9 on March 08, 2019.  -She continues to feel better, no new complaints, gaining weight after she has lost approximately 80 pounds prior to her treatment with I-131.  -She sleeps well, denies  palpitations, tremors, anxiety.                              Review of systems  Constitutional: +  weight gain , -+fatigue /sleepiness, - subjective hyperthermia Eyes: no blurry vision, + xerophthalmia ENT: no sore throat,  no dysphagia/odynophagia, nor hoarseness  Gastrointestinal: no Nausea, no Vomiting, no Diarhhea Musculoskeletal: no muscle/joint aches Skin: no rashes Neurological: no  tremors, no numbness, no tingling, no dizziness Psychiatric: no depression, no  anxiety   Objective:    BP 126/87   Pulse 86   Ht 5\' 3"  (1.6 m)   Wt 155 lb (70.3 kg)   SpO2 98%   BMI 27.46 kg/m   Wt Readings from Last 3 Encounters:  03/15/19 155 lb (70.3 kg)  02/10/19 160 lb (72.6 kg)  06/08/18 140 lb (63.5 kg)                                                Physical exam  Constitutional:  not in acute  distress, normal state of mind Eyes:  EOMI, no exophthalmos Neck: Supple Respiratory: Adequate breathing efforts Musculoskeletal: no gross deformities, strength intact in all four extremities Skin:  no rashes, no hyperemia Neurological: no tremor with outstretched hands.  CMP     Component Value Date/Time   NA 136 02/10/2019 2148   K 4.2 02/10/2019 2148   CL 100 02/10/2019 2148   CO2 26 02/10/2019 2148   GLUCOSE 83 02/10/2019 2148   BUN 12 02/10/2019 2148   CREATININE 1.16 (H) 02/10/2019 2148   CREATININE 0.33 (L) 06/26/2017 1016   CALCIUM 9.0 02/10/2019 2148   PROT 7.9 02/10/2019 2148   ALBUMIN 4.6 02/10/2019 2148   AST 34 02/10/2019 2148   ALT 17 02/10/2019 2148   ALKPHOS 58 02/10/2019 2148   BILITOT 0.6 02/10/2019 2148   GFRNONAA 55 (L) 02/10/2019 2148   GFRNONAA 132 06/26/2017 1016   GFRAA >60 02/10/2019 2148   GFRAA 153 06/26/2017 1016     CBC    Component Value Date/Time   WBC 5.2 02/10/2019 2148   RBC 3.42 (L) 02/10/2019 2148   HGB 11.9 (L) 02/10/2019 2148   HCT 35.8 (L) 02/10/2019 2148   PLT 251 02/10/2019 2148   MCV 104.7 (H) 02/10/2019 2148   MCH 34.8 (H) 02/10/2019 2148   MCHC 33.2 02/10/2019 2148   RDW 13.4 02/10/2019 2148   LYMPHSABS 2.1 02/10/2019 2148   MONOABS 0.5 02/10/2019 2148   EOSABS 0.1 02/10/2019 2148   BASOSABS 0.0 02/10/2019 2148    Lab Results  Component Value Date   TSH 57.266 (H) 02/10/2019   TSH 33.22 (H) 02/17/2018   TSH <0.01 (L) 12/15/2017   TSH 0.01 (L) 09/17/2017   TSH <0.01 (L) 06/26/2017   TSH <0.010 (L) 02/25/2017   FREET4 0.9 03/08/2019   FREET4 0.2 (L) 02/17/2018   FREET4 1.2 12/15/2017   FREET4 4.5 (H) 09/17/2017      Assessment & Plan:   1. Hyperthyroidism 2. Graves' disease  -She is status post radioactive iodine ablation on October 16, 2017 for Graves' disease.  Her previsit thyroid function tests are consistent with treatment effect . -She may need a higher dose of levothyroxine on subsequent visits,  however she is advised to be consistent and take her levothyroxine  levothyroxine 75 mcg p.o. nightly.     - We discussed about the correct intake of her thyroid hormone, on empty stomach at fasting, with water, separated by at least 30 minutes from breakfast and other medications,  and separated by more than 4 hours from calcium, iron, multivitamins, acid reflux medications (PPIs). -Patient is made aware of the fact that thyroid hormone replacement is needed for life, dose to be adjusted by periodic monitoring of thyroid function tests.   - I advised her to maintain close follow up with Patient, No Pcp Per for primary care needs.   Time for this visit: 15 minutes. Linda Lodge  participated in the discussions, expressed understanding, and voiced agreement with the above plans.  All questions were answered to her satisfaction. she is encouraged to contact clinic should she have any questions or concerns prior to her return visit.  Follow up plan: Return in about 6 months (around 09/14/2019) for Follow up with Pre-visit Labs.  Thank you for involving me in the care of this pleasant patient, and I will continue to update you with her progress.  Marquis Lunch, MD [facility] Phone: [phone]  Fax: [phone]   03/15/2019, 10:59 AM  This note was partially dictated with voice recognition software. Similar sounding words can be transcribed inadequately or may not  be corrected upon review.

## 2019-06-02 ENCOUNTER — Other Ambulatory Visit: Payer: Self-pay

## 2019-06-02 ENCOUNTER — Emergency Department (HOSPITAL_COMMUNITY)
Admission: EM | Admit: 2019-06-02 | Discharge: 2019-06-02 | Disposition: A | Discharge status: Home / Self Care | Payer: BC Managed Care – PPO | Attending: Emergency Medicine | Admitting: Emergency Medicine

## 2019-06-02 ENCOUNTER — Encounter (HOSPITAL_COMMUNITY): Payer: Self-pay

## 2019-06-02 ENCOUNTER — Emergency Department (HOSPITAL_COMMUNITY): Payer: BC Managed Care – PPO

## 2019-06-02 DIAGNOSIS — F17210 Nicotine dependence, cigarettes, uncomplicated: Secondary | ICD-10-CM | POA: Insufficient documentation

## 2019-06-02 DIAGNOSIS — J45909 Unspecified asthma, uncomplicated: Secondary | ICD-10-CM | POA: Insufficient documentation

## 2019-06-02 DIAGNOSIS — M79642 Pain in left hand: Secondary | ICD-10-CM

## 2019-06-02 DIAGNOSIS — M79645 Pain in left finger(s): Secondary | ICD-10-CM | POA: Diagnosis not present

## 2019-06-02 MED ORDER — NAPROXEN 500 MG PO TABS: 500.0000 mg | ORAL_TABLET | Freq: Two times a day (BID) | ORAL | 0 refills | Status: DC | PRN

## 2019-06-02 NOTE — Discharge Instructions (Signed)
You were seen in the emergency department today with left wrist/thumb pain.  Your x-rays were normal.  Please use the splint as needed for comfort.  Take naproxen as needed for moderate pain.  I have provided the name for a orthopedic surgeon who you can call to schedule a follow-up appointment.

## 2019-06-02 NOTE — ED Triage Notes (Signed)
Pt reports she feels tender knots at the base of her thumbs for the past 2 months.

## 2019-06-02 NOTE — ED Provider Notes (Signed)
Emergency Department Provider Note   I have reviewed the triage vital signs and the nursing notes.   HISTORY  Chief Complaint Hand Pain   HPI Linda Lin is a 48 y.o. female with PMH reviewed below presents to the emergency department for evaluation of pain in both of her hands but worse at the base of the left thumb.  Patient has had approximately 2 months of symptoms and is now feeling tenderness at the base of the left thumb with a tender "knot" like area.  She describes pain in her fingers worse in the morning but improves throughout the day.  Her pain at the base of the thumbs is worsening and limiting her mobility.  No injuries.  She denies any fevers, redness, drainage.  She did see her primary care physician regarding the symptoms and was told that this could be carpal tunnel.  Patient was given a splint along with some anti-inflammatory medication but these interventions have not worked. Denies neck pain or hand weakness.   Past Medical History:  Diagnosis Date  . Asthma   . Cellulitis   . Scabies   . Thyroid disorder     Patient Active Problem List   Diagnosis Date Noted  . Hypothyroidism following radioiodine therapy 02/24/2018  . Graves' disease 09/19/2017  . Contact dermatitis 12/21/2015  . Bilateral lower leg cellulitis, recurrent 12/19/2015  . Lower extremity edema 12/19/2015  . History of asthma, mild 12/19/2015  . Maculopapular rash, generalized 08/30/2015  . Tobacco abuse 08/30/2015  . Cellulitis of both lower extremities 08/27/2015    History reviewed. No pertinent surgical history.  Allergies Cephalexin and Doxycycline  Family History  Problem Relation Age of Onset  . Asthma Mother   . Diabetes Father   . Kidney failure Father   . Hypertension Sister   . Diabetes Mellitus II Brother   . Cancer Neg Hx     Social History Social History   Tobacco Use  . Smoking status: Current Every Day Smoker    Packs/day: 0.25    Years: 20.00   Pack years: 5.00    Types: Cigarettes  . Smokeless tobacco: Never Used  Substance Use Topics  . Alcohol use: Yes    Comment: occ  . Drug use: Not Currently    Types: Marijuana    Comment: sporadically-last used sept 2019    Review of Systems  Constitutional: No fever/chills Eyes: No visual changes. ENT: No sore throat. Cardiovascular: Denies chest pain. Respiratory: Denies shortness of breath. Gastrointestinal: No abdominal pain.  No nausea, no vomiting.  No diarrhea.  No constipation. Genitourinary: Negative for dysuria. Musculoskeletal: Positive pain at the base of the left thumb.  Skin: Negative for rash. Neurological: Negative for headaches, focal weakness or numbness.  10-point ROS otherwise negative.  ____________________________________________   PHYSICAL EXAM:  VITAL SIGNS: ED Triage Vitals  Enc Vitals Group     BP 06/02/19 0734 (!) 160/91     Pulse Rate 06/02/19 0734 78     Resp 06/02/19 0734 18     Temp 06/02/19 0734 98.7 F (37.1 C)     Temp Source 06/02/19 0734 Oral     SpO2 06/02/19 0734 99 %     Weight 06/02/19 0735 150 lb (68 kg)     Height 06/02/19 0735 5\' 3"  (1.6 m)   Constitutional: Alert and oriented. Well appearing and in no acute distress. Eyes: Conjunctivae are normal. Head: Atraumatic. Nose: No congestion/rhinnorhea. Mouth/Throat: Mucous membranes are moist.  Neck:  No stridor.  Cardiovascular: Normal rate, regular rhythm.  Respiratory: Normal respiratory effort.  Gastrointestinal: No distention.  Musculoskeletal: Tenderness at the base of the left thumb. No deformity.  Neurologic:  Normal speech and language. No numbness in the hands or grip strength weakness.  Skin:  Skin is warm, dry and intact. No rash noted.  ____________________________________________  RADIOLOGY  Dg Finger Thumb Left  Result Date: 06/02/2019 CLINICAL DATA:  Tenderness at base of left thumb.  No known injury. EXAM: LEFT THUMB 2+V COMPARISON:  None. FINDINGS:  There is no evidence of fracture or dislocation. There is no evidence of arthropathy or other focal bone abnormality. Soft tissues are unremarkable. IMPRESSION: Negative. Electronically Signed   By: Delbert Phenix M.D.   On: 06/02/2019 08:28    ____________________________________________   PROCEDURES  Procedure(s) performed:   Procedures  None  ____________________________________________   INITIAL IMPRESSION / ASSESSMENT AND PLAN / ED COURSE  Pertinent labs & imaging results that were available during my care of the patient were reviewed by me and considered in my medical decision making (see chart for details).   Patient presents to the emergency department with pain in the bilateral thumbs worse on the left with a small palpable nodule that is tender to palpation.  Differential includes osteoarthritis and carpal tunnel syndrome. RA or bony lesion less likely. No report of injury. Plan for plan film and like referral to ortho for further eval as conservative measures via PCP have not been effective.   08:35 AM  Plain film of the left thumb reviewed.  No acute bony abnormality.  Volar splint provided for comfort.  Naproxen as needed for pain along with plan for orthopedic follow-up. Contact information provided.  ____________________________________________  FINAL CLINICAL IMPRESSION(S) / ED DIAGNOSES  Final diagnoses:  Left hand pain    NEW OUTPATIENT MEDICATIONS STARTED DURING THIS VISIT:  New Prescriptions   NAPROXEN (NAPROSYN) 500 MG TABLET    Take 1 tablet (500 mg total) by mouth 2 (two) times daily as needed for moderate pain.    Note:  This document was prepared using Dragon voice recognition software and may include unintentional dictation errors.  Alona Bene, MD Emergency Medicine    , Arlyss Repress, MD 06/02/19 [phone]

## 2019-06-23 ENCOUNTER — Other Ambulatory Visit: Payer: Self-pay

## 2019-06-23 DIAGNOSIS — R6889 Other general symptoms and signs: Secondary | ICD-10-CM | POA: Diagnosis not present

## 2019-06-23 DIAGNOSIS — Z20822 Contact with and (suspected) exposure to covid-19: Secondary | ICD-10-CM

## 2019-06-24 LAB — NOVEL CORONAVIRUS, NAA: SARS-CoV-2, NAA: NOT DETECTED

## 2019-09-14 ENCOUNTER — Ambulatory Visit: Payer: BC Managed Care – PPO | Admitting: "Endocrinology

## 2019-09-24 DIAGNOSIS — I219 Acute myocardial infarction, unspecified: Secondary | ICD-10-CM

## 2019-09-24 HISTORY — DX: Acute myocardial infarction, unspecified: I21.9

## 2019-09-27 ENCOUNTER — Other Ambulatory Visit: Payer: Self-pay

## 2019-09-27 ENCOUNTER — Other Ambulatory Visit: Payer: Self-pay | Admitting: "Endocrinology

## 2019-09-27 DIAGNOSIS — E0590 Thyrotoxicosis, unspecified without thyrotoxic crisis or storm: Secondary | ICD-10-CM

## 2019-09-27 LAB — T4, FREE: Free T4: 0.7 ng/dL — ABNORMAL LOW (ref 0.8–1.8)

## 2019-09-27 LAB — TSH: TSH: 34.07 mIU/L — ABNORMAL HIGH

## 2019-10-04 ENCOUNTER — Ambulatory Visit (INDEPENDENT_AMBULATORY_CARE_PROVIDER_SITE_OTHER): Payer: BC Managed Care – PPO | Admitting: "Endocrinology

## 2019-10-04 ENCOUNTER — Encounter: Payer: Self-pay | Admitting: "Endocrinology

## 2019-10-04 ENCOUNTER — Other Ambulatory Visit: Payer: Self-pay | Admitting: "Endocrinology

## 2019-10-04 DIAGNOSIS — E890 Postprocedural hypothyroidism: Secondary | ICD-10-CM

## 2019-10-04 MED ORDER — LEVOTHYROXINE SODIUM 100 MCG PO TABS: 100.0000 ug | ORAL_TABLET | Freq: Every day | ORAL | 1 refills | Status: DC

## 2019-10-04 NOTE — Progress Notes (Signed)
10/04/2019                                Endocrinology Telehealth Visit Follow up Note -During COVID -19 Pandemic  I connected with Linda Lin on 10/04/2019   by telephone and verified that I am speaking with the correct person using two identifiers. Linda Lin, Jan 06, 1971. she has verbally consented to this visit. All issues noted in this document were discussed and addressed. The format was not optimal for physical exam.  Subjective:    Patient ID: Linda Lin, female    DOB: Jan 11, 1971, PCP Patient, No Pcp Per.   Past Medical History:  Diagnosis Date  . Asthma   . Cellulitis   . Scabies   . Thyroid disorder    History reviewed. No pertinent surgical history. Social History   Socioeconomic History  . Marital status: Single    Spouse name: Not on file  . Number of children: Not on file  . Years of education: Not on file  . Highest education level: Not on file  Occupational History  . Not on file  Tobacco Use  . Smoking status: Current Every Day Smoker    Packs/day: 0.25    Years: 20.00    Pack years: 5.00    Types: Cigarettes  . Smokeless tobacco: Never Used  Substance and Sexual Activity  . Alcohol use: Yes    Comment: occ  . Drug use: Not Currently    Types: Marijuana    Comment: sporadically-last used sept 2019  . Sexual activity: Not Currently    Partners: Male    Birth control/protection: None  Other Topics Concern  . 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    Social Determinants of Health   Financial Resource Strain:   . Difficulty of Paying Living Expenses: Not on file  Food Insecurity:   . Worried About Charity fundraiser in the Last Year: Not on file  . Ran Out of Food in the Last Year: Not on file  Transportation Needs:   . Lack of Transportation (Medical): Not on file  . Lack of Transportation (Non-Medical): Not on file  Physical Activity:   . Days of Exercise per Week: Not on file  .  Minutes of Exercise per Session: Not on file  Stress:   . Feeling of Stress : Not on file  Social Connections:   . Frequency of Communication with Friends and Family: Not on file  . Frequency of Social Gatherings with Friends and Family: Not on file  . Attends Religious Services: Not on file  . Active Member of Clubs or Organizations: Not on file  . Attends Archivist Meetings: Not on file  . Marital Status: Not on file   Outpatient Encounter Medications as of 10/04/2019  Medication Sig  . acetaminophen (TYLENOL) 500 MG tablet Take 500 mg by mouth every 6 (six) hours as needed for mild pain or moderate pain.   Marland Kitchen levothyroxine (SYNTHROID) 100 MCG tablet Take 1 tablet (100 mcg total) by mouth daily before breakfast for 180 doses.  . naproxen (NAPROSYN) 500 MG tablet Take 1 tablet (500 mg total) by mouth 2 (two) times daily as needed for moderate pain.  . predniSONE (DELTASONE) 50 MG tablet Take one tablet daily for 5 days.  . [DISCONTINUED] levothyroxine (SYNTHROID) 75 MCG tablet Take 1 tablet (75 mcg total)  by mouth daily before breakfast for 30 doses.   No facility-administered encounter medications on file as of 10/04/2019.    ALLERGIES: Allergies  Allergen Reactions  . Cephalexin Rash  . Doxycycline Rash    VACCINATION STATUS:  There is no immunization history on file for this patient.   HPI  Linda Lin is 49 y.o. 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   - she was treated with I-131 thyroid ablation on October 16, 2017 for Graves' disease.  She reports better compliance with her thyroid hormone supplement this time, however, her previsit thyroid function tests are consistent with adequate treatment.    -She continues to feel better, no new complaints, regaining some of her weight back after she has lost approximately 80 pounds prior to her treatment with I-131.  -She sleeps well, denies  palpitations, tremors, anxiety.                              Review of systems Limited as above.   Objective:    There were no vitals taken for this visit.  Wt Readings from Last 3 Encounters:  06/02/19 150 lb (68 kg)  03/15/19 155 lb (70.3 kg)  02/10/19 160 lb (72.6 kg)                                                Physical exam  CMP     Component Value Date/Time   NA 136 02/10/2019 2148   K 4.2 02/10/2019 2148   CL 100 02/10/2019 2148   CO2 26 02/10/2019 2148   GLUCOSE 83 02/10/2019 2148   BUN 12 02/10/2019 2148   CREATININE 1.16 (H) 02/10/2019 2148   CREATININE 0.33 (L) 06/26/2017 1016   CALCIUM 9.0 02/10/2019 2148   PROT 7.9 02/10/2019 2148   ALBUMIN 4.6 02/10/2019 2148   AST 34 02/10/2019 2148   ALT 17 02/10/2019 2148   ALKPHOS 58 02/10/2019 2148   BILITOT 0.6 02/10/2019 2148   GFRNONAA 55 (L) 02/10/2019 2148   GFRNONAA 132 06/26/2017 1016   GFRAA >60 02/10/2019 2148   GFRAA 153 06/26/2017 1016     CBC    Component Value Date/Time   WBC 5.2 02/10/2019 2148   RBC 3.42 (L) 02/10/2019 2148   HGB 11.9 (L) 02/10/2019 2148   HCT 35.8 (L) 02/10/2019 2148   PLT 251 02/10/2019 2148   MCV 104.7 (H) 02/10/2019 2148   MCH 34.8 (H) 02/10/2019 2148   MCHC 33.2 02/10/2019 2148   RDW 13.4 02/10/2019 2148   LYMPHSABS 2.1 02/10/2019 2148   MONOABS 0.5 02/10/2019 2148   EOSABS 0.1 02/10/2019 2148   BASOSABS 0.0 02/10/2019 2148    Lab Results  Component Value Date   TSH 34.07 (H) 09/27/2019   TSH 57.266 (H) 02/10/2019   TSH 33.22 (H) 02/17/2018   TSH <0.01 (L) 12/15/2017   TSH 0.01 (L) 09/17/2017   TSH <0.01 (L) 06/26/2017   TSH <0.010 (L) 02/25/2017   FREET4 0.7 (L) 09/27/2019   FREET4 0.9 03/08/2019   FREET4 0.2 (L) 02/17/2018   FREET4 1.2 12/15/2017   FREET4 4.5 (H) 09/17/2017      Assessment & Plan:   1. Hyperthyroidism 2. Graves' disease  -She is status post radioactive iodine ablation on  October 16, 2017 for Graves' disease.  Her previsit  thyroid function tests are consistent with adequate replacement.  She will need a higher dose of levothyroxine.  I discussed and increase her levothyroxine to 100 mcg p.o. daily before breakfast.  - We discussed about the correct intake of her thyroid hormone, on empty stomach at fasting, with water, separated by at least 30 minutes from breakfast and other medications,  and separated by more than 4 hours from calcium, iron, multivitamins, acid reflux medications (PPIs). -Patient is made aware of the fact that thyroid hormone replacement is needed for life, dose to be adjusted by periodic monitoring of thyroid function tests.   - I advised her to maintain close follow up with Patient, No Pcp Per for primary care needs.      - Time spent on this patient care encounter:  20 minutes of which 50% was spent in  counseling and the rest reviewing  her current and  previous labs / studies and medications  doses and developing a plan for long term care. [name]  participated in the discussions, expressed understanding, and voiced agreement with the above plans.  All questions were answered to her satisfaction. she is encouraged to contact clinic should she have any questions or concerns prior to her return visit.   Follow up plan: Return in about 3 months (around 01/02/2020) for Follow up with Pre-visit Labs.  Thank you for involving me in the care of this pleasant patient, and I will continue to update you with her progress.  Marquis Lunch, MD [facility] Phone: [phone]  Fax: [phone]   10/04/2019, 10:43 AM  This note was partially dictated with voice recognition software. Similar sounding words can be transcribed inadequately or may not  be corrected upon review.

## 2019-10-05 MED ORDER — LEVOTHYROXINE SODIUM 100 MCG PO TABS: 100.0000 ug | ORAL_TABLET | Freq: Every day | ORAL | 0 refills | Status: DC

## 2019-12-28 DIAGNOSIS — E890 Postprocedural hypothyroidism: Secondary | ICD-10-CM | POA: Diagnosis not present

## 2019-12-28 LAB — TSH: TSH: 15.83 mIU/L — ABNORMAL HIGH

## 2019-12-28 LAB — T4, FREE: Free T4: 1 ng/dL (ref 0.8–1.8)

## 2020-01-04 ENCOUNTER — Encounter: Payer: Self-pay | Admitting: "Endocrinology

## 2020-01-04 ENCOUNTER — Ambulatory Visit: Payer: BC Managed Care – PPO | Admitting: "Endocrinology

## 2020-01-04 ENCOUNTER — Other Ambulatory Visit: Payer: Self-pay

## 2020-01-04 VITALS — BP 123/82 | HR 89 | Ht 63.0 in | Wt 181.6 lb

## 2020-01-04 DIAGNOSIS — E890 Postprocedural hypothyroidism: Secondary | ICD-10-CM

## 2020-01-04 MED ORDER — LEVOTHYROXINE SODIUM 112 MCG PO TABS: 112.0000 ug | ORAL_TABLET | Freq: Every day | ORAL | 1 refills | Status: DC

## 2020-01-04 NOTE — Progress Notes (Signed)
01/04/2020                         Endocrinology follow-up note   Subjective:    Patient ID: Linda Lin, female    DOB: [birth year], PCP Patient, No Pcp Per.   Past Medical History:  Diagnosis Date  . Asthma   . Cellulitis   . Scabies   . Thyroid disorder    History reviewed. No pertinent surgical history. Social History   Socioeconomic History  . Marital status: Single    Spouse name: Not on file  . Number of children: Not on file  . Years of education: Not on file  . Highest education level: Not on file  Occupational History  . Not on file  Tobacco Use  . Smoking status: Current Every Day Smoker    Packs/day: 0.25    Years: 20.00    Pack years: 5.00    Types: Cigarettes  . Smokeless tobacco: Never Used  Substance and Sexual Activity  . Alcohol use: Yes    Comment: occ  . Drug use: Not Currently    Types: Marijuana    Comment: sporadically-last used sept 2019  . Sexual activity: Not Currently    Partners: Male    Birth control/protection: None  Other Topics Concern  . Not on file  Social History Narrative   Work At [employer]   Supervisor.    Social Determinants of Health   Financial Resource Strain:   . Difficulty of Paying Living Expenses:   Food Insecurity:   . Worried About Programme researcher, broadcasting/film/video in the Last Year:   . Barista in the Last Year:   Transportation Needs:   . Freight forwarder (Medical):   Marland Kitchen Lack of Transportation (Non-Medical):   Physical Activity:   . Days of Exercise per Week:   . Minutes of Exercise per Session:   Stress:   . Feeling of Stress :   Social Connections:   . Frequency of Communication with Friends and Family:   . Frequency of Social Gatherings with Friends and Family:   . Attends Religious Services:   . Active Member of Clubs or Organizations:   . Attends Banker Meetings:   Marland Kitchen Marital Status:    Outpatient Encounter Medications as of 01/04/2020  Medication Sig  . acetaminophen  (TYLENOL) 500 MG tablet Take 500 mg by mouth every 6 (six) hours as needed for mild pain or moderate pain.   Marland Kitchen levothyroxine (SYNTHROID) 112 MCG tablet Take 1 tablet (112 mcg total) by mouth daily before breakfast for 180 doses.  . naproxen (NAPROSYN) 500 MG tablet Take 1 tablet (500 mg total) by mouth 2 (two) times daily as needed for moderate pain.  . [DISCONTINUED] levothyroxine (SYNTHROID) 100 MCG tablet Take 1 tablet (100 mcg total) by mouth daily before breakfast for 180 doses.  . [DISCONTINUED] predniSONE (DELTASONE) 50 MG tablet Take one tablet daily for 5 days.   No facility-administered encounter medications on file as of 01/04/2020.    ALLERGIES: Allergies  Allergen Reactions  . Cephalexin Rash  . Doxycycline Rash    VACCINATION STATUS:  There is no immunization history on file for this patient.   HPI  Linda Lin is 49 y.o. female who is returning for follow-up with repeat thyroid function tests.  She is status post  I-131 therapy for Graves' disease in January 2019.   -She was subsequently started on thyroid hormone  replacement, currently on levothyroxine 100 mcg p.o. daily before breakfast.  She still admits some inconsistency taking her medication. She denies any palpitations, tremors, nor heat/cold intolerance.  She continues to gain the weight she lost during thyrotoxicosis.    -She continues to feel better, no new complaints.                            Review of systems Limited as above.   Objective:    BP 123/82   Pulse 89   Ht 5\' 3"  (1.6 m)   Wt 181 lb 9.6 oz (82.4 kg)   BMI 32.17 kg/m   Wt Readings from Last 3 Encounters:  01/04/20 181 lb 9.6 oz (82.4 kg)  06/02/19 150 lb (68 kg)  03/15/19 155 lb (70.3 kg)                                                Physical exam   Physical Exam- Limited  Constitutional:  Body mass index is 32.17 kg/m. , not in acute distress, normal state of mind Eyes:  EOMI, no exophthalmos Neck:  Supple Thyroid: No gross goiter Respiratory: Adequate breathing efforts Musculoskeletal: no gross deformities, strength intact in all four extremities, no gross restriction of joint movements Skin:  no rashes, no hyperemia Neurological: no tremor with outstretched hands,   CMP     Component Value Date/Time   NA 136 02/10/2019 2148   K 4.2 02/10/2019 2148   CL 100 02/10/2019 2148   CO2 26 02/10/2019 2148   GLUCOSE 83 02/10/2019 2148   BUN 12 02/10/2019 2148   CREATININE 1.16 (H) 02/10/2019 2148   CREATININE 0.33 (L) 06/26/2017 1016   CALCIUM 9.0 02/10/2019 2148   PROT 7.9 02/10/2019 2148   ALBUMIN 4.6 02/10/2019 2148   AST 34 02/10/2019 2148   ALT 17 02/10/2019 2148   ALKPHOS 58 02/10/2019 2148   BILITOT 0.6 02/10/2019 2148   GFRNONAA 55 (L) 02/10/2019 2148   GFRNONAA 132 06/26/2017 1016   GFRAA >60 02/10/2019 2148   GFRAA 153 06/26/2017 1016    CBC    Component Value Date/Time   WBC 5.2 02/10/2019 2148   RBC 3.42 (L) 02/10/2019 2148   HGB 11.9 (L) 02/10/2019 2148   HCT 35.8 (L) 02/10/2019 2148   PLT 251 02/10/2019 2148   MCV 104.7 (H) 02/10/2019 2148   MCH 34.8 (H) 02/10/2019 2148   MCHC 33.2 02/10/2019 2148   RDW 13.4 02/10/2019 2148   LYMPHSABS 2.1 02/10/2019 2148   MONOABS 0.5 02/10/2019 2148   EOSABS 0.1 02/10/2019 2148   BASOSABS 0.0 02/10/2019 2148    Lab Results  Component Value Date   TSH 15.83 (H) 12/28/2019   TSH 34.07 (H) 09/27/2019   TSH 57.266 (H) 02/10/2019   TSH 33.22 (H) 02/17/2018   TSH <0.01 (L) 12/15/2017   TSH 0.01 (L) 09/17/2017   TSH <0.01 (L) 06/26/2017   TSH <0.010 (L) 02/25/2017   FREET4 1.0 12/28/2019   FREET4 0.7 (L) 09/27/2019   FREET4 0.9 03/08/2019   FREET4 0.2 (L) 02/17/2018   FREET4 1.2 12/15/2017   FREET4 4.5 (H) 09/17/2017      Assessment & Plan:   1.  RAI induced hypothyroidism  2. Graves' disease-resolved  -She is status post radioactive iodine ablation on October 16, 2017  for Graves' disease.  Her previsit  thyroid function tests are improving, however still consistent with inadequate replacement.  I discussed and increased her levothyroxine to 112 mcg p.o. daily before breakfast.     - We discussed about the correct intake of her thyroid hormone, on empty stomach at fasting, with water, separated by at least 30 minutes from breakfast and other medications,  and separated by more than 4 hours from calcium, iron, multivitamins, acid reflux medications (PPIs). -Patient is made aware of the fact that thyroid hormone replacement is needed for life, dose to be adjusted by periodic monitoring of thyroid function tests.  - I advised her to maintain close follow up with her PCP for primary care needs.      - Time spent on this patient care encounter:  20 minutes of which 50% was spent in  counseling and the rest reviewing  her current and  previous labs / studies and medications  doses and developing a plan for long term care. [name]  participated in the discussions, expressed understanding, and voiced agreement with the above plans.  All questions were answered to her satisfaction. she is encouraged to contact clinic should she have any questions or concerns prior to her return visit.   Follow up plan: Return in about 6 months (around 07/05/2020) for Follow up with Pre-visit Labs.  Thank you for involving me in the care of this pleasant patient, and I will continue to update you with her progress.  Marquis Lunch, MD [facility] Phone: [phone]  Fax: [phone]   01/04/2020, 1:34 PM  This note was partially dictated with voice recognition software. Similar sounding words can be transcribed inadequately or may not  be corrected upon review.

## 2020-04-24 ENCOUNTER — Encounter (HOSPITAL_COMMUNITY): Payer: Self-pay | Admitting: *Deleted

## 2020-04-24 ENCOUNTER — Emergency Department (HOSPITAL_COMMUNITY): Payer: Self-pay

## 2020-04-24 ENCOUNTER — Ambulatory Visit
Admission: EM | Admit: 2020-04-24 | Discharge: 2020-04-24 | Disposition: A | Discharge status: Home / Self Care | Payer: BC Managed Care – PPO

## 2020-04-24 ENCOUNTER — Other Ambulatory Visit: Payer: Self-pay

## 2020-04-24 ENCOUNTER — Emergency Department (HOSPITAL_COMMUNITY)
Admission: EM | Admit: 2020-04-24 | Discharge: 2020-04-24 | Disposition: A | Discharge status: Home / Self Care | Payer: Self-pay | Attending: Emergency Medicine | Admitting: Emergency Medicine

## 2020-04-24 DIAGNOSIS — F17210 Nicotine dependence, cigarettes, uncomplicated: Secondary | ICD-10-CM | POA: Insufficient documentation

## 2020-04-24 DIAGNOSIS — Y939 Activity, unspecified: Secondary | ICD-10-CM | POA: Insufficient documentation

## 2020-04-24 DIAGNOSIS — M25532 Pain in left wrist: Secondary | ICD-10-CM | POA: Insufficient documentation

## 2020-04-24 DIAGNOSIS — W010XXA Fall on same level from slipping, tripping and stumbling without subsequent striking against object, initial encounter: Secondary | ICD-10-CM | POA: Insufficient documentation

## 2020-04-24 DIAGNOSIS — E039 Hypothyroidism, unspecified: Secondary | ICD-10-CM | POA: Insufficient documentation

## 2020-04-24 DIAGNOSIS — Y929 Unspecified place or not applicable: Secondary | ICD-10-CM | POA: Insufficient documentation

## 2020-04-24 DIAGNOSIS — M79602 Pain in left arm: Secondary | ICD-10-CM

## 2020-04-24 DIAGNOSIS — Y999 Unspecified external cause status: Secondary | ICD-10-CM | POA: Insufficient documentation

## 2020-04-24 DIAGNOSIS — S59912A Unspecified injury of left forearm, initial encounter: Secondary | ICD-10-CM | POA: Insufficient documentation

## 2020-04-24 DIAGNOSIS — W19XXXA Unspecified fall, initial encounter: Secondary | ICD-10-CM

## 2020-04-24 DIAGNOSIS — J45909 Unspecified asthma, uncomplicated: Secondary | ICD-10-CM | POA: Insufficient documentation

## 2020-04-24 DIAGNOSIS — Z79899 Other long term (current) drug therapy: Secondary | ICD-10-CM | POA: Insufficient documentation

## 2020-04-24 DIAGNOSIS — R52 Pain, unspecified: Secondary | ICD-10-CM

## 2020-04-24 NOTE — ED Triage Notes (Signed)
Pt fell at work and c/o LT forearm pain

## 2020-04-24 NOTE — ED Provider Notes (Signed)
[facility] EMERGENCY DEPARTMENT Provider Note   CSN: [id number] Arrival date & time: 04/24/20  1442     History Chief Complaint  Patient presents with  . Fall  . Arm Injury    Linda Lin is a 49 y.o. female with a past medical history of hypothyroid who presents today for evaluation after a fall at work.  She reports that she was at work when she fell.  She denies syncopal event.  She states that she moved to get out of someone's way and tripped over a machine causing her to fall back.  She did not strike her head.  Does not take any blood thinning medications.  She reports that her employer wanted her to get checked out.  She reports the only injury is her left arm, denies any other injuries or concerns today.  She reports that briefly after she fell the entire arm was numb.  Denies any neck pain.  This rapidly returned to normal.  She fell landing on her flexed arm, initially mostly had pain in her upper arm and shoulder however then developed pain more in the elbow.  Now she feels like her pain is mostly in her wrist.  She denies any wounds.   HPI     Past Medical History:  Diagnosis Date  . Asthma   . Cellulitis   . Scabies   . Thyroid disorder     Patient Active Problem List   Diagnosis Date Noted  . Hypothyroidism following radioiodine therapy 02/24/2018  . Graves' disease 09/19/2017  . Contact dermatitis 12/21/2015  . Bilateral lower leg cellulitis, recurrent 12/19/2015  . Lower extremity edema 12/19/2015  . History of asthma, mild 12/19/2015  . Maculopapular rash, generalized 08/30/2015  . Tobacco abuse 08/30/2015  . Cellulitis of both lower extremities 08/27/2015    History reviewed. No pertinent surgical history.   OB History    Gravida  0   Para  0   Term  0   Preterm  0   AB  0   Living  0     SAB  0   TAB  0   Ectopic  0   Multiple  0   Live Births              Family History  Problem Relation Age of Onset  . Asthma  Mother   . Diabetes Father   . Kidney failure Father   . Hypertension Sister   . Diabetes Mellitus II Brother   . Cancer Neg Hx     Social History   Tobacco Use  . Smoking status: Current Every Day Smoker    Packs/day: 0.25    Years: 20.00    Pack years: 5.00    Types: Cigarettes  . Smokeless tobacco: Never Used  Vaping Use  . Vaping Use: Never used  Substance Use Topics  . Alcohol use: Yes    Comment: occ  . Drug use: Not Currently    Types: Marijuana    Comment: sporadically-last used sept 2019    Home Medications Prior to Admission medications   Medication Sig Start Date End Date Taking? Authorizing Provider  acetaminophen (TYLENOL) 500 MG tablet Take 500 mg by mouth every 6 (six) hours as needed for mild pain or moderate pain.     [provider]  levothyroxine (SYNTHROID) 112 MCG tablet Take 1 tablet (112 mcg total) by mouth daily before breakfast for 180 doses. 01/04/20 07/02/20  Roma Kayser, MD  naproxen (NAPROSYN) 500 MG tablet Take 1 tablet (500 mg total) by mouth 2 (two) times daily as needed for moderate pain. 06/02/19   Long, Arlyss Repress, MD    Allergies    Cephalexin and Doxycycline  Review of Systems   Review of Systems  Constitutional: Negative for chills and fever.  Musculoskeletal: Negative for back pain and neck pain.  Skin: Negative for color change and wound.  Neurological: Negative for weakness and headaches.  All other systems reviewed and are negative.   Physical Exam Updated Vital Signs BP (!) 144/88   Pulse 62   Temp 98.3 F (36.8 C) (Oral)   Resp 18   Ht 5\' 2"  (1.575 m)   Wt 72.6 kg   LMP 03/29/2020   SpO2 99%   BMI 29.26 kg/m   Physical Exam Vitals and nursing note reviewed.  Constitutional:      General: She is not in acute distress.    Appearance: She is well-developed. She is not diaphoretic.  HENT:     Head: Normocephalic and atraumatic.  Eyes:     General: No scleral icterus.       Right eye: No  discharge.        Left eye: No discharge.     Conjunctiva/sclera: Conjunctivae normal.  Cardiovascular:     Rate and Rhythm: Normal rate and regular rhythm.     Pulses: Normal pulses.     Comments: 2+ left radial pulse  Pulmonary:     Effort: Pulmonary effort is normal. No respiratory distress.     Breath sounds: No stridor.  Abdominal:     General: There is no distension.  Musculoskeletal:        General: No deformity.     Cervical back: Normal range of motion.     Comments: LUE: Mild edema around the distal forearm on the radial aspect.  There is no tenderness to palpation over the anatomic snuffbox.  No obvious deformities or crepitus.  She is able to fully range the elbow and shoulder without significant pain.  Range of motion at the left wrist is limited secondary to pain.  Skin:    General: Skin is warm and dry.  Neurological:     Mental Status: She is alert.     Motor: No abnormal muscle tone.     Comments: Sensation intact to light touch to entire left upper extremity.  Pain in the left wrist limits wrist strength testing, however otherwise strength is 5/5 to left upper extremity.  Psychiatric:        Mood and Affect: Mood normal.        Behavior: Behavior normal.     ED Results / Procedures / Treatments   Labs (all labs ordered are listed, but only abnormal results are displayed) Labs Reviewed - No data to display  EKG None  Radiology DG Forearm Left  Result Date: 04/24/2020 CLINICAL DATA:  06/24/2020, distal forearm pain EXAM: LEFT FOREARM - 2 VIEW COMPARISON:  None. FINDINGS: No acute fracture or dislocation. Mild degenerative changes of the first CMC. Subcortical cysts within the scaphoid and lunate, likely degenerative in etiology. No area of erosion or osseous destruction. No unexpected radiopaque foreign body. Soft tissues are unremarkable. IMPRESSION: No acute fracture or dislocation of the radius and ulna. If persistent concern for fracture, recommend dedicated views  of the symptomatic region. Electronically Signed   By: Larey Seat MD   On: 04/24/2020 15:15    Procedures Procedures (including critical care  time)  Medications Ordered in ED Medications - No data to display  ED Course  I have reviewed the triage vital signs and the nursing notes.  Pertinent labs & imaging results that were available during my care of the patient were reviewed by me and considered in my medical decision making (see chart for details).    MDM Rules/Calculators/A&P                         Patient is a 49 year old woman who presents today for evaluation of a mechanical, nonsyncopal fall at work.  On my exam she has mild edema of the distal forearm.  She has full, active, pain-free range of motion of the remainder of the left arm.  She denies striking her head, any pain in her neck, head or other injuries.  On my evaluation her sensation is intact to light touch.  She appears neurovascularly intact.  X-rays had been obtained by triage showing no fracture, dislocation or other acute abnormalities.  As patient does not have anatomic snuffbox pain no indication for dedicated wrist films.  She does not have significant pain in her elbow with range of motion, doubt elbow fracture.  Ace wrap.  Recommended rest.  Work note given.  Ice and elevation advised.  OTC medications as needed for pain.  Outpatient follow-up with orthopedics in 1 week if symptoms persist.  Return precautions were discussed with patient who states their understanding.  At the time of discharge patient denied any unaddressed complaints or concerns.  Patient is agreeable for discharge home.  Note: Portions of this report may have been transcribed using voice recognition software. Every effort was made to ensure accuracy; however, inadvertent computerized transcription errors may be present   Final Clinical Impression(s) / ED Diagnoses Final diagnoses:  Fall, initial encounter  Left arm pain    Rx /  DC Orders ED Discharge Orders    None       Norman Clay 04/24/20 2013    Milagros Loll, MD 04/27/20 [phone]

## 2020-04-24 NOTE — Discharge Instructions (Addendum)
Please take Ibuprofen (Advil, motrin) and Tylenol (acetaminophen) to relieve your pain.  You may take up to 600 MG (3 pills) of normal strength ibuprofen every 8 hours as needed.  In between doses of ibuprofen you make take tylenol, up to 1,000 mg (two extra strength pills).  Do not take more than 3,000 mg tylenol in a 24 hour period.  Please check all medication labels as many medications such as pain and cold medications may contain tylenol.  Do not drink alcohol while taking these medications.  Do not take other NSAID'S while taking ibuprofen (such as aleve or naproxen).  Please take ibuprofen with food to decrease stomach upset.  While in the emergency room your blood pressure is slightly elevated, this is most likely due to pain and the stress of being in the emergency room.  Please get this rechecked in the next month as if it remains high you may need medications to help bring it down.

## 2020-04-24 NOTE — ED Triage Notes (Signed)
Fell at work, pain in left elbow

## 2020-04-24 NOTE — ED Notes (Signed)
Patient is being discharged from the Urgent Care and sent to the Emergency Department or another urgent care via pov . Per [place], patient is in need of higher level of care due to pt needs xrays and there is not xray tech in office today. Patient is aware and verbalizes understanding of plan of care. There were no vitals filed for this visit.

## 2020-05-10 ENCOUNTER — Telehealth: Payer: Self-pay | Admitting: Orthopedic Surgery

## 2020-05-10 NOTE — Telephone Encounter (Signed)
Patient called and left message that she needs to schedule an appointment.  I tried to call her back but I had to leave her a voicemail.

## 2020-05-11 ENCOUNTER — Telehealth: Payer: Self-pay | Admitting: Orthopedic Surgery

## 2020-05-11 NOTE — Telephone Encounter (Signed)
I spoke to this patient today.  She was seen at the [facility] ED on 04/27/20 for an elbow/wrist injury.  She states this is to be covered under [employer].  I told her that before we could schedule an appointment and that it be covered under the [facility], she would have to have someone from the workman's comp. Insurance call our office to give approval and to give [place] the insurance information.  She understood.  She will have someone call us back.

## 2020-05-15 ENCOUNTER — Ambulatory Visit (INDEPENDENT_AMBULATORY_CARE_PROVIDER_SITE_OTHER): Payer: Worker's Compensation | Admitting: Orthopedic Surgery

## 2020-05-15 ENCOUNTER — Encounter: Payer: Self-pay | Admitting: Orthopedic Surgery

## 2020-05-15 ENCOUNTER — Other Ambulatory Visit: Payer: Self-pay

## 2020-05-15 VITALS — BP 142/85 | HR 100 | Ht 62.0 in | Wt 180.0 lb

## 2020-05-15 DIAGNOSIS — S5012XA Contusion of left forearm, initial encounter: Secondary | ICD-10-CM

## 2020-05-15 NOTE — Patient Instructions (Signed)
RTW RT UPPER EXTREMITY WORK ONLY, NO USE LEFT ARM OR HAND X 3 WEEKS   OR OOW 3 WEEKS   MUST WORK IN BRACE   CONTINUE NAPROXEN

## 2020-05-15 NOTE — Progress Notes (Signed)
NEW PROBLEM//OFFICE VISIT  Chief Complaint  Patient presents with  . Fall    left forearm pain after fall on 04/24/20     49 year old female fell landed on her left forearm about 3 weeks ago on August 2 went to the ER x-rays were negative patient was given an Ace bandage but eventually got a splint on her own because of continued pain in the distal third of the forearm on the radial side  She is currently on naproxen complaining of continued pain in the same area.  Elbow pain has resolved   Review of Systems  Constitutional: Negative for fever.  Skin: Negative.   Neurological: Negative for tingling.     Past Medical History:  Diagnosis Date  . Asthma   . Cellulitis   . Scabies   . Thyroid disorder     History reviewed. No pertinent surgical history.  Family History  Problem Relation Age of Onset  . Asthma Mother   . Diabetes Father   . Kidney failure Father   . Hypertension Sister   . Diabetes Mellitus II Brother   . Cancer Neg Hx    Social History   Tobacco Use  . Smoking status: Current Every Day Smoker    Packs/day: 0.25    Years: 20.00    Pack years: 5.00    Types: Cigarettes  . Smokeless tobacco: Never Used  Vaping Use  . Vaping Use: Never used  Substance Use Topics  . Alcohol use: Yes    Comment: occ  . Drug use: Not Currently    Types: Marijuana    Comment: sporadically-last used sept 2019    Allergies  Allergen Reactions  . Cephalexin Rash  . Doxycycline Rash    Current Meds  Medication Sig  . acetaminophen (TYLENOL) 500 MG tablet Take 500 mg by mouth every 6 (six) hours as needed for mild pain or moderate pain.   Marland Kitchen levothyroxine (SYNTHROID) 112 MCG tablet Take 1 tablet (112 mcg total) by mouth daily before breakfast for 180 doses.  . naproxen (NAPROSYN) 500 MG tablet Take 1 tablet (500 mg total) by mouth 2 (two) times daily as needed for moderate pain.    BP (!) 142/85   Pulse 100   Ht 5\' 2"  (1.575 m)   Wt 180 lb (81.6 kg)   BMI 32.92  kg/m   Physical Exam Normal development grooming and hygiene awake alert and oriented x3 mood and affect normal Ortho Exam  Gait no abnormalities  Left upper extremity wrist and elbow are nontender painless range of motion wrist and elbow on the left side tenderness is just at the distal third middle third junction of the forearm with normal motion and strength in the hand elbow range of motion is normal nontender  MEDICAL DECISION MAKING  A.  Encounter Diagnosis  Name Primary?  . Contusion of left forearm, initial encounter Yes    B. DATA ANALYSED:   IMAGING:  My interpretation of images: X-rays left forearm were negative they were done at [facility] Orders: None  Outside records reviewed: ER   C. MANAGEMENT   I gave her a longer brace to support the forearm continue the naproxen  Patient returned to work right hand duty only with no use left hand and left hand/wrist forearm brace for 3 weeks if no work available did not work 3 weeks  Follow-up 3 weeks  No orders of the defined types were placed in this encounter.     [facility]  Romeo Apple, MD  05/15/2020 11:48 AM

## 2020-05-15 NOTE — Telephone Encounter (Signed)
Cl# received [id number]

## 2020-05-15 NOTE — Telephone Encounter (Signed)
Call received back from employer office manager Velma at [employer], ph# [phone], giving verbal approval for appointment, for patient to be seen, evaluated for return to work. States uses [employer] for [employer], regional office, attn Rosalin [place], fax# [phone] / ph# [phone]. Scheduled for today, 05/15/20 - patient aware. Form faxed for sign off and claim number.

## 2020-05-24 ENCOUNTER — Encounter (HOSPITAL_COMMUNITY): Payer: Self-pay | Admitting: Emergency Medicine

## 2020-05-24 ENCOUNTER — Other Ambulatory Visit: Payer: Self-pay

## 2020-05-24 DIAGNOSIS — E039 Hypothyroidism, unspecified: Secondary | ICD-10-CM | POA: Insufficient documentation

## 2020-05-24 DIAGNOSIS — R21 Rash and other nonspecific skin eruption: Secondary | ICD-10-CM | POA: Insufficient documentation

## 2020-05-24 DIAGNOSIS — R600 Localized edema: Secondary | ICD-10-CM | POA: Insufficient documentation

## 2020-05-24 DIAGNOSIS — L0390 Cellulitis, unspecified: Secondary | ICD-10-CM | POA: Insufficient documentation

## 2020-05-24 DIAGNOSIS — F17210 Nicotine dependence, cigarettes, uncomplicated: Secondary | ICD-10-CM | POA: Insufficient documentation

## 2020-05-24 DIAGNOSIS — Z79899 Other long term (current) drug therapy: Secondary | ICD-10-CM | POA: Insufficient documentation

## 2020-05-24 DIAGNOSIS — L539 Erythematous condition, unspecified: Secondary | ICD-10-CM | POA: Insufficient documentation

## 2020-05-24 DIAGNOSIS — J45909 Unspecified asthma, uncomplicated: Secondary | ICD-10-CM | POA: Insufficient documentation

## 2020-05-24 DIAGNOSIS — F1210 Cannabis abuse, uncomplicated: Secondary | ICD-10-CM | POA: Insufficient documentation

## 2020-05-24 LAB — BASIC METABOLIC PANEL
Anion gap: 9 (ref 5–15)
BUN: 15 mg/dL (ref 6–20)
CO2: 25 mmol/L (ref 22–32)
Calcium: 9.1 mg/dL (ref 8.9–10.3)
Chloride: 102 mmol/L (ref 98–111)
Creatinine, Ser: 0.89 mg/dL (ref 0.44–1.00)
GFR calc Af Amer: >60 mL/min (ref >60)
GFR calc non Af Amer: >60 mL/min (ref >60)
Glucose, Bld: 92 mg/dL (ref 70–99)
Potassium: 3.8 mmol/L (ref 3.5–5.1)
Sodium: 136 mmol/L (ref 135–145)

## 2020-05-24 LAB — CBC WITH DIFFERENTIAL/PLATELET
Abs Immature Granulocytes: 0.02 10*3/uL (ref 0.00–0.07)
Basophils Absolute: 0 10*3/uL (ref 0.0–0.1)
Basophils Relative: 0 %
Eosinophils Absolute: 0.5 10*3/uL (ref 0.0–0.5)
Eosinophils Relative: 6 %
HCT: 41.3 % (ref 36.0–46.0)
Hemoglobin: 13.1 g/dL (ref 12.0–15.0)
Immature Granulocytes: 0 %
Lymphocytes Relative: 29 %
Lymphs Abs: 2.5 10*3/uL (ref 0.7–4.0)
MCH: 32.7 pg (ref 26.0–34.0)
MCHC: 31.7 g/dL (ref 30.0–36.0)
MCV: 103 fL — ABNORMAL HIGH (ref 80.0–100.0)
Monocytes Absolute: 0.7 10*3/uL (ref 0.1–1.0)
Monocytes Relative: 8 %
Neutro Abs: 4.8 10*3/uL (ref 1.7–7.7)
Neutrophils Relative %: 57 %
Platelets: 377 10*3/uL (ref 150–400)
RBC: 4.01 MIL/uL (ref 3.87–5.11)
RDW: 13.5 % (ref 11.5–15.5)
WBC: 8.4 10*3/uL (ref 4.0–10.5)
nRBC: 0 % (ref 0.0–0.2)

## 2020-05-24 NOTE — ED Triage Notes (Signed)
Pt states bilateral lower leg swelling and weeping x one week.

## 2020-05-25 ENCOUNTER — Emergency Department (HOSPITAL_COMMUNITY)
Admission: EM | Admit: 2020-05-25 | Discharge: 2020-05-25 | Disposition: A | Discharge status: Home / Self Care | Payer: Self-pay | Attending: Emergency Medicine | Admitting: Emergency Medicine

## 2020-05-25 DIAGNOSIS — L0390 Cellulitis, unspecified: Secondary | ICD-10-CM

## 2020-05-25 DIAGNOSIS — R600 Localized edema: Secondary | ICD-10-CM

## 2020-05-25 MED ORDER — FUROSEMIDE 20 MG PO TABS: 20.0000 mg | ORAL_TABLET | Freq: Every day | ORAL | 0 refills | Status: DC

## 2020-05-25 MED ORDER — CLINDAMYCIN HCL 300 MG PO CAPS: 300.0000 mg | ORAL_CAPSULE | Freq: Four times a day (QID) | ORAL | 0 refills | Status: DC

## 2020-05-25 MED ORDER — CLINDAMYCIN HCL 150 MG PO CAPS
300.0000 mg | ORAL_CAPSULE | Freq: Once | ORAL | Status: AC
  Administered 2020-05-25: 300 mg via ORAL
  Filled 2020-05-25: qty 2

## 2020-05-25 MED ORDER — HYDROXYZINE HCL 25 MG PO TABS: 25.0000 mg | ORAL_TABLET | Freq: Three times a day (TID) | ORAL | 0 refills | Status: DC | PRN

## 2020-05-25 MED ORDER — TRIAMCINOLONE ACETONIDE 0.1 % EX CREA: 1.0000 "application " | TOPICAL_CREAM | Freq: Two times a day (BID) | CUTANEOUS | 0 refills | Status: DC

## 2020-05-25 NOTE — ED Provider Notes (Signed)
[facility] EMERGENCY DEPARTMENT Provider Note   CSN: [id number] Arrival date & time: 05/24/20  1930     History Chief Complaint  Patient presents with  . Leg Pain    Linda Lin is a 49 y.o. female.   Leg Pain Location:  Leg Injury: no   Leg location:  R lower leg and L lower leg Pain details:    Quality:  Aching, burning and sharp   Radiates to:  Does not radiate   Severity:  Mild   Timing:  Constant Chronicity:  Recurrent Dislocation: no   Tetanus status:  Unknown Prior injury to area:  Yes Relieved by:  None tried Worsened by:  Nothing Ineffective treatments: hydrocortisone cream.      Past Medical History:  Diagnosis Date  . Asthma   . Cellulitis   . Scabies   . Thyroid disorder     Patient Active Problem List   Diagnosis Date Noted  . Hypothyroidism following radioiodine therapy 02/24/2018  . Graves' disease 09/19/2017  . Contact dermatitis 12/21/2015  . Bilateral lower leg cellulitis, recurrent 12/19/2015  . Lower extremity edema 12/19/2015  . History of asthma, mild 12/19/2015  . Maculopapular rash, generalized 08/30/2015  . Tobacco abuse 08/30/2015  . Cellulitis of both lower extremities 08/27/2015    History reviewed. No pertinent surgical history.   OB History    Gravida  0   Para  0   Term  0   Preterm  0   AB  0   Living  0     SAB  0   TAB  0   Ectopic  0   Multiple  0   Live Births              Family History  Problem Relation Age of Onset  . Asthma Mother   . Diabetes Father   . Kidney failure Father   . Hypertension Sister   . Diabetes Mellitus II Brother   . Cancer Neg Hx     Social History   Tobacco Use  . Smoking status: Current Every Day Smoker    Packs/day: 0.25    Years: 20.00    Pack years: 5.00    Types: Cigarettes  . Smokeless tobacco: Never Used  Vaping Use  . Vaping Use: Never used  Substance Use Topics  . Alcohol use: Yes    Comment: occ  . Drug use: Not Currently     Types: Marijuana    Comment: sporadically-last used sept 2019    Home Medications Prior to Admission medications   Medication Sig Start Date End Date Taking? Authorizing Provider  acetaminophen (TYLENOL) 500 MG tablet Take 500 mg by mouth every 6 (six) hours as needed for mild pain or moderate pain.     [provider]  clindamycin (CLEOCIN) 300 MG capsule Take 1 capsule (300 mg total) by mouth 4 (four) times daily. X 7 days 05/25/20   Zvi Duplantis, Barbara Cower, MD  furosemide (LASIX) 20 MG tablet Take 1 tablet (20 mg total) by mouth daily for 7 days. 05/25/20 06/01/20  Rossie Bretado, Barbara Cower, MD  hydrOXYzine (ATARAX/VISTARIL) 25 MG tablet Take 1 tablet (25 mg total) by mouth every 8 (eight) hours as needed for itching. 05/25/20   Latice Waitman, Barbara Cower, MD  levothyroxine (SYNTHROID) 112 MCG tablet Take 1 tablet (112 mcg total) by mouth daily before breakfast for 180 doses. 01/04/20 07/02/20  Roma Kayser, MD  naproxen (NAPROSYN) 500 MG tablet Take 1 tablet (500 mg total)  by mouth 2 (two) times daily as needed for moderate pain. 06/02/19   Long, Arlyss Repress, MD  triamcinolone cream (KENALOG) 0.1 % Apply 1 application topically 2 (two) times daily. 05/25/20   Guerino Caporale, Barbara Cower, MD    Allergies    Cephalexin and Doxycycline  Review of Systems   Review of Systems  All other systems reviewed and are negative.   Physical Exam Updated Vital Signs BP (!) 152/87   Pulse 91   Temp 98.7 F (37.1 C) (Oral)   Resp 18   Ht 5\' 2"  (1.575 m)   Wt 85.3 kg   LMP 05/08/2020   SpO2 100%   BMI 34.39 kg/m   Physical Exam Vitals and nursing note reviewed.  Constitutional:      Appearance: She is well-developed.  HENT:     Head: Normocephalic and atraumatic.     Mouth/Throat:     Mouth: Mucous membranes are moist.     Pharynx: Oropharynx is clear.  Eyes:     Pupils: Pupils are equal, round, and reactive to light.  Cardiovascular:     Rate and Rhythm: Normal rate and regular rhythm.  Pulmonary:     Effort: No respiratory  distress.     Breath sounds: No stridor.  Abdominal:     General: Abdomen is flat. There is no distension.  Musculoskeletal:        General: No swelling or tenderness. Normal range of motion.     Cervical back: Normal range of motion.     Right lower leg: Edema present.     Left lower leg: Edema present.  Skin:    General: Skin is warm and dry.     Findings: Erythema (erythema, warmth, tenderness and excoriations to bilateral anterior tibial areas in the setting of venous stasis ) and rash present.  Neurological:     General: No focal deficit present.     Mental Status: She is alert.     ED Results / Procedures / Treatments   Labs (all labs ordered are listed, but only abnormal results are displayed) Labs Reviewed  CBC WITH DIFFERENTIAL/PLATELET - Abnormal; Notable for the following components:      Result Value   MCV 103.0 (*)    All other components within normal limits  BASIC METABOLIC PANEL    EKG None  Radiology No results found.  Procedures Procedures (including critical care time)  Medications Ordered in ED Medications  clindamycin (CLEOCIN) capsule 300 mg (300 mg Oral Given 05/25/20 0123)    ED Course  I have reviewed the triage vital signs and the nursing notes.  Pertinent labs & imaging results that were available during my care of the patient were reviewed by me and considered in my medical decision making (see chart for details).    MDM Rules/Calculators/A&P                          Likely cellulitis based on previou shistory of same and appearance. Significantly excoriated making difficult diagnosis, but will treat for same. pcp follow up. Also shourt course of diuretics for edema.  Final Clinical Impression(s) / ED Diagnoses Final diagnoses:  Cellulitis, unspecified cellulitis site  Bilateral leg edema    Rx / DC Orders ED Discharge Orders         Ordered    furosemide (LASIX) 20 MG tablet  Daily        05/25/20 0112    triamcinolone cream  (KENALOG)  0.1 %  2 times daily        05/25/20 0112    clindamycin (CLEOCIN) 300 MG capsule  4 times daily        05/25/20 0112    hydrOXYzine (ATARAX/VISTARIL) 25 MG tablet  Every 8 hours PRN        05/25/20 0112           Ebenezer Mccaskey, Barbara Cower, MD 05/26/20 [phone]

## 2020-05-27 ENCOUNTER — Other Ambulatory Visit: Payer: Self-pay

## 2020-05-27 ENCOUNTER — Encounter (HOSPITAL_COMMUNITY): Payer: Self-pay | Admitting: *Deleted

## 2020-05-27 ENCOUNTER — Emergency Department (HOSPITAL_COMMUNITY)
Admission: EM | Admit: 2020-05-27 | Discharge: 2020-05-27 | Disposition: A | Discharge status: Home / Self Care | Payer: Self-pay | Attending: Emergency Medicine | Admitting: Emergency Medicine

## 2020-05-27 DIAGNOSIS — J45909 Unspecified asthma, uncomplicated: Secondary | ICD-10-CM | POA: Insufficient documentation

## 2020-05-27 DIAGNOSIS — E039 Hypothyroidism, unspecified: Secondary | ICD-10-CM | POA: Insufficient documentation

## 2020-05-27 DIAGNOSIS — L259 Unspecified contact dermatitis, unspecified cause: Secondary | ICD-10-CM | POA: Insufficient documentation

## 2020-05-27 DIAGNOSIS — F17210 Nicotine dependence, cigarettes, uncomplicated: Secondary | ICD-10-CM | POA: Insufficient documentation

## 2020-05-27 DIAGNOSIS — Z79890 Hormone replacement therapy (postmenopausal): Secondary | ICD-10-CM | POA: Insufficient documentation

## 2020-05-27 DIAGNOSIS — Z79899 Other long term (current) drug therapy: Secondary | ICD-10-CM | POA: Insufficient documentation

## 2020-05-27 MED ORDER — DEXAMETHASONE SODIUM PHOSPHATE 10 MG/ML IJ SOLN
10.0000 mg | Freq: Once | INTRAMUSCULAR | Status: AC
  Administered 2020-05-27: 10 mg via INTRAMUSCULAR
  Filled 2020-05-27: qty 1

## 2020-05-27 MED ORDER — PREDNISONE 10 MG PO TABS: ORAL_TABLET | ORAL | 0 refills | Status: DC

## 2020-05-27 NOTE — ED Triage Notes (Signed)
States she was given an antibiotic last week, c/o blisters to hands and rash over body

## 2020-05-27 NOTE — Discharge Instructions (Signed)
Continue taking your medication as prescribed.  At the prednisone starting tomorrow.  Contact your dermatology provider on Tuesday to arrange a follow-up appointment.  Return emergency department if you develop any worsening symptoms

## 2020-05-27 NOTE — ED Provider Notes (Signed)
[facility] EMERGENCY DEPARTMENT Provider Note   CSN: [id number] Arrival date & time: 05/27/20  0946     History Chief Complaint  Patient presents with   Allergic Reaction    Linda Lin is a 49 y.o. female.  HPI    Linda Lin is a 49 y.o. female thyroid disorder asthma and recurrent cellulitis, who presents to the Emergency Department complaining of pain, blisters and itching to both hands.  She was seen here 2 days ago and treated for a cellulitis of her lower legs.  She reports significant improvement with application of the steroid cream.  She noticed blisters to her hands that are worsening for 1-2 days.  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  Intermittently wears gloves.  She also reports having similar symptoms several years ago which required admission and improved after steroids.  Denies fever, chills, shortness of breath, swelling of her face, lips or tongue.  No hx of anaphylaxis or auto immune disorders.      Past Medical History:  Diagnosis Date   Asthma    Cellulitis    Scabies    Thyroid disorder     Patient Active Problem List   Diagnosis Date Noted   Hypothyroidism following radioiodine therapy 02/24/2018   Graves' disease 09/19/2017   Contact dermatitis 12/21/2015   Bilateral lower leg cellulitis, recurrent 12/19/2015   Lower extremity edema 12/19/2015   History of asthma, mild 12/19/2015   Maculopapular rash, generalized 08/30/2015   Tobacco abuse 08/30/2015   Cellulitis of both lower extremities 08/27/2015    History reviewed. No pertinent surgical history.   OB History    Gravida  0   Para  0   Term  0   Preterm  0   AB  0   Living  0     SAB  0   TAB  0   Ectopic  0   Multiple  0   Live Births              Family History  Problem Relation Age of Onset   Asthma Mother    Diabetes Father    Kidney failure Father    Hypertension Sister    Diabetes  Mellitus II Brother    Cancer Neg Hx     Social History   Tobacco Use   Smoking status: Current Every Day Smoker    Packs/day: 0.25    Years: 20.00    Pack years: 5.00    Types: Cigarettes   Smokeless tobacco: Never Used  Building services engineer Use: Never used  Substance Use Topics   Alcohol use: Yes    Comment: occ   Drug use: Not Currently    Types: Marijuana    Comment: sporadically-last used sept 2019    Home Medications Prior to Admission medications   Medication Sig Start Date End Date Taking? Authorizing Provider  acetaminophen (TYLENOL) 500 MG tablet Take 500 mg by mouth every 6 (six) hours as needed for mild pain or moderate pain.     [provider]  clindamycin (CLEOCIN) 300 MG capsule Take 1 capsule (300 mg total) by mouth 4 (four) times daily. X 7 days 05/25/20   Mesner, Barbara Cower, MD  furosemide (LASIX) 20 MG tablet Take 1 tablet (20 mg total) by mouth daily for 7 days. 05/25/20 06/01/20  Mesner, Barbara Cower, MD  hydrOXYzine (ATARAX/VISTARIL) 25 MG tablet Take 1 tablet (25 mg total) by mouth every 8 (  eight) hours as needed for itching. 05/25/20   Mesner, Barbara Cower, MD  levothyroxine (SYNTHROID) 112 MCG tablet Take 1 tablet (112 mcg total) by mouth daily before breakfast for 180 doses. 01/04/20 07/02/20  Roma Kayser, MD  naproxen (NAPROSYN) 500 MG tablet Take 1 tablet (500 mg total) by mouth 2 (two) times daily as needed for moderate pain. 06/02/19   Long, Arlyss Repress, MD  triamcinolone cream (KENALOG) 0.1 % Apply 1 application topically 2 (two) times daily. 05/25/20   Mesner, Barbara Cower, MD    Allergies    Cephalexin and Doxycycline  Review of Systems   Review of Systems  Constitutional: Negative for activity change, appetite change, chills and fever.  HENT: Negative for facial swelling, sore throat and trouble swallowing.   Respiratory: Negative for chest tightness, shortness of breath and wheezing.   Musculoskeletal: Negative for arthralgias, myalgias, neck pain and neck  stiffness.  Skin: Positive for rash (blisters to her fingers and hands, rash to both legs,, neck and bilateral groin ). Negative for wound.  Neurological: Negative for dizziness, syncope, weakness, numbness and headaches.    Physical Exam Updated Vital Signs BP 129/89    Pulse 67    Temp 97.6 F (36.4 C) (Oral)    Resp 18    LMP 05/08/2020    SpO2 100%   Physical Exam Vitals and nursing note reviewed.  Constitutional:      General: She is not in acute distress.    Appearance: She is well-developed. She is not toxic-appearing.  HENT:     Head: Normocephalic.     Mouth/Throat:     Mouth: Mucous membranes are moist.     Comments: No oral lesions or edema.  Uvula is midline and non edematous Cardiovascular:     Rate and Rhythm: Normal rate and regular rhythm.     Pulses: Normal pulses.  Pulmonary:     Effort: Pulmonary effort is normal. No respiratory distress.     Breath sounds: Normal breath sounds.  Musculoskeletal:        General: No tenderness.     Cervical back: Normal range of motion and neck supple.  Lymphadenopathy:     Cervical: No cervical adenopathy.  Skin:    General: Skin is warm.     Capillary Refill: Capillary refill takes less than 2 seconds.     Findings: Erythema and rash present.     Comments: Multiple, serous blisters to the fingers of both hands.  No weeping or erythema. No pustules.   She also has some erythema of the bilateral LE's with healing excoriations.  Mildly edematous. No weeping  See attached photos  Neurological:     General: No focal deficit present.     Mental Status: She is alert.     Sensory: No sensory deficit.     Motor: No weakness or abnormal muscle tone.       Pt gave verbal content for photos to be attached to her medical record.  Understands that photos are not stored to any other electronic devices.    ED Results / Procedures / Treatments   Labs (all labs ordered are listed, but only abnormal results are displayed) Labs  Reviewed - No data to display  EKG None  Radiology No results found.  Procedures Procedures (including critical care time)  Medications Ordered in ED Medications  dexamethasone (DECADRON) injection 10 mg (10 mg Intramuscular Given 05/27/20 1533)    ED Course  I have reviewed the triage vital signs  and the nursing notes.  Pertinent labs & imaging results that were available during my care of the patient were reviewed by me and considered in my medical decision making (see chart for details).    MDM Rules/Calculators/A&P                          Pt seen here 2 days ago for likely cellulitis of the LE's.  Treated with hydroxyzine, Lasix and Clindamycin.  Now having rash to both hands.  Hx of similar presentation in 2017 in which she was admitted.  Had autoimmune workup at that time which was unremarkable and she was seen by dermatology.  She reports significant improvement after taking steroid and the current rash of her legs is improving with triamcinolone cream.  She is well appearing and felt to be a contact dermatitis.  Given that this is a recurring issue, I feel that she will need close f/u with dermatology.  I will have her continue the clindamycin and I will add prednisone taper to her regime.  She appears appropriate for d/c home, agrees to arrange f/u next week.  Return precautions given.     Final Clinical Impression(s) / ED Diagnoses Final diagnoses:  Contact dermatitis, unspecified contact dermatitis type, unspecified trigger    Rx / DC Orders ED Discharge Orders    None       Pauline Aus, PA-C 05/27/20 1644    Bethann Berkshire, MD 05/30/20 [phone]

## 2020-06-05 ENCOUNTER — Encounter: Payer: Self-pay | Admitting: Orthopedic Surgery

## 2020-06-05 ENCOUNTER — Ambulatory Visit (INDEPENDENT_AMBULATORY_CARE_PROVIDER_SITE_OTHER): Payer: Worker's Compensation | Admitting: Orthopedic Surgery

## 2020-06-05 ENCOUNTER — Other Ambulatory Visit: Payer: Self-pay

## 2020-06-05 VITALS — BP 156/104 | HR 98 | Ht 62.0 in | Wt 175.0 lb

## 2020-06-05 DIAGNOSIS — S5012XD Contusion of left forearm, subsequent encounter: Secondary | ICD-10-CM | POA: Diagnosis not present

## 2020-06-05 NOTE — Progress Notes (Signed)
Chief Complaint  Patient presents with  . Arm Injury    04/24/20 left forearm injury / feels better    Status post contusion left forearm patient treated with a splint after 6 weeks she says she is doing well  Her exam is normal with full range of motion and strength without tenderness left upper extremity  Return to work full duty today follow-up as needed  Encounter Diagnosis  Name Primary?  . Contusion of left forearm, subsequent encounter Yes

## 2020-06-05 NOTE — Patient Instructions (Signed)
Get rid of the brace   Today return to regular duty

## 2020-06-09 ENCOUNTER — Encounter (HOSPITAL_COMMUNITY): Payer: Self-pay | Admitting: Emergency Medicine

## 2020-06-09 ENCOUNTER — Other Ambulatory Visit: Payer: Self-pay

## 2020-06-09 ENCOUNTER — Emergency Department (HOSPITAL_COMMUNITY): Payer: Self-pay

## 2020-06-09 ENCOUNTER — Encounter (HOSPITAL_COMMUNITY)
Admission: EM | Disposition: A | Discharge status: Home / Self Care | Payer: Self-pay | Source: Home / Self Care | Attending: Interventional Cardiology

## 2020-06-09 ENCOUNTER — Inpatient Hospital Stay (HOSPITAL_COMMUNITY): Payer: Self-pay

## 2020-06-09 ENCOUNTER — Inpatient Hospital Stay (HOSPITAL_COMMUNITY)
Admission: EM | Admit: 2020-06-09 | Discharge: 2020-06-10 | DRG: 282 | Disposition: A | Discharge status: Home / Self Care | Payer: Self-pay | Attending: Interventional Cardiology | Admitting: Interventional Cardiology

## 2020-06-09 DIAGNOSIS — E039 Hypothyroidism, unspecified: Secondary | ICD-10-CM

## 2020-06-09 DIAGNOSIS — Z881 Allergy status to other antibiotic agents status: Secondary | ICD-10-CM

## 2020-06-09 DIAGNOSIS — E119 Type 2 diabetes mellitus without complications: Secondary | ICD-10-CM | POA: Diagnosis present

## 2020-06-09 DIAGNOSIS — E785 Hyperlipidemia, unspecified: Secondary | ICD-10-CM

## 2020-06-09 DIAGNOSIS — Z20822 Contact with and (suspected) exposure to covid-19: Secondary | ICD-10-CM | POA: Diagnosis present

## 2020-06-09 DIAGNOSIS — Z79899 Other long term (current) drug therapy: Secondary | ICD-10-CM

## 2020-06-09 DIAGNOSIS — I2510 Atherosclerotic heart disease of native coronary artery without angina pectoris: Secondary | ICD-10-CM

## 2020-06-09 DIAGNOSIS — F17210 Nicotine dependence, cigarettes, uncomplicated: Secondary | ICD-10-CM | POA: Diagnosis present

## 2020-06-09 DIAGNOSIS — Z825 Family history of asthma and other chronic lower respiratory diseases: Secondary | ICD-10-CM

## 2020-06-09 DIAGNOSIS — Z8249 Family history of ischemic heart disease and other diseases of the circulatory system: Secondary | ICD-10-CM

## 2020-06-09 DIAGNOSIS — Z79890 Hormone replacement therapy (postmenopausal): Secondary | ICD-10-CM

## 2020-06-09 DIAGNOSIS — E890 Postprocedural hypothyroidism: Secondary | ICD-10-CM | POA: Diagnosis present

## 2020-06-09 DIAGNOSIS — Z720 Tobacco use: Secondary | ICD-10-CM | POA: Diagnosis present

## 2020-06-09 DIAGNOSIS — Z841 Family history of disorders of kidney and ureter: Secondary | ICD-10-CM

## 2020-06-09 DIAGNOSIS — I214 Non-ST elevation (NSTEMI) myocardial infarction: Secondary | ICD-10-CM

## 2020-06-09 DIAGNOSIS — Z833 Family history of diabetes mellitus: Secondary | ICD-10-CM

## 2020-06-09 DIAGNOSIS — E118 Type 2 diabetes mellitus with unspecified complications: Secondary | ICD-10-CM

## 2020-06-09 HISTORY — PX: LEFT HEART CATH AND CORONARY ANGIOGRAPHY: CATH118249

## 2020-06-09 LAB — CREATININE, SERUM
Creatinine, Ser: 0.72 mg/dL (ref 0.44–1.00)
GFR calc Af Amer: >60 mL/min (ref >60)
GFR calc non Af Amer: >60 mL/min (ref >60)

## 2020-06-09 LAB — CBC
HCT: 39.7 % (ref 36.0–46.0)
HCT: 36.7 % (ref 36.0–46.0)
Hemoglobin: 12.6 g/dL (ref 12.0–15.0)
Hemoglobin: 11.5 g/dL — ABNORMAL LOW (ref 12.0–15.0)
MCH: 32.4 pg (ref 26.0–34.0)
MCH: 32 pg (ref 26.0–34.0)
MCHC: 31.7 g/dL (ref 30.0–36.0)
MCHC: 31.3 g/dL (ref 30.0–36.0)
MCV: 102.1 fL — ABNORMAL HIGH (ref 80.0–100.0)
MCV: 102.2 fL — ABNORMAL HIGH (ref 80.0–100.0)
Platelets: 311 10*3/uL (ref 150–400)
Platelets: 344 10*3/uL (ref 150–400)
RBC: 3.89 MIL/uL (ref 3.87–5.11)
RBC: 3.59 MIL/uL — ABNORMAL LOW (ref 3.87–5.11)
RDW: 13.6 % (ref 11.5–15.5)
RDW: 13.5 % (ref 11.5–15.5)
WBC: 9.9 10*3/uL (ref 4.0–10.5)
WBC: 10.3 10*3/uL (ref 4.0–10.5)
nRBC: 0 % (ref 0.0–0.2)
nRBC: 0 % (ref 0.0–0.2)

## 2020-06-09 LAB — ECHOCARDIOGRAM COMPLETE
Area-P 1/2: 3.65 cm2
Height: 62 in
S' Lateral: 3.4 cm
Weight: 2800 oz

## 2020-06-09 LAB — SARS CORONAVIRUS 2 BY RT PCR (HOSPITAL ORDER, PERFORMED IN ~~LOC~~ HOSPITAL LAB): SARS Coronavirus 2: NEGATIVE

## 2020-06-09 LAB — BASIC METABOLIC PANEL
Anion gap: 7 (ref 5–15)
BUN: 8 mg/dL (ref 6–20)
CO2: 25 mmol/L (ref 22–32)
Calcium: 10 mg/dL (ref 8.9–10.3)
Chloride: 103 mmol/L (ref 98–111)
Creatinine, Ser: 0.64 mg/dL (ref 0.44–1.00)
GFR calc Af Amer: >60 mL/min (ref >60)
GFR calc non Af Amer: >60 mL/min (ref >60)
Glucose, Bld: 141 mg/dL — ABNORMAL HIGH (ref 70–99)
Potassium: 4.1 mmol/L (ref 3.5–5.1)
Sodium: 135 mmol/L (ref 135–145)

## 2020-06-09 LAB — T4, FREE: Free T4: 0.96 ng/dL (ref 0.61–1.12)

## 2020-06-09 LAB — LIPID PANEL
Cholesterol: 192 mg/dL (ref 0–200)
HDL: 48 mg/dL (ref >40)
LDL Cholesterol: 125 mg/dL — ABNORMAL HIGH (ref 0–99)
Total CHOL/HDL Ratio: 4 RATIO
Triglycerides: 96 mg/dL (ref <150)
VLDL: 19 mg/dL (ref 0–40)

## 2020-06-09 LAB — HEMOGLOBIN A1C
Hgb A1c MFr Bld: 7.1 % — ABNORMAL HIGH (ref 4.8–5.6)
Mean Plasma Glucose: 157.07 mg/dL

## 2020-06-09 LAB — HEPATIC FUNCTION PANEL
ALT: 20 U/L (ref 0–44)
AST: 69 U/L — ABNORMAL HIGH (ref 15–41)
Albumin: 3.1 g/dL — ABNORMAL LOW (ref 3.5–5.0)
Alkaline Phosphatase: 79 U/L (ref 38–126)
Bilirubin, Direct: <0.1 mg/dL (ref 0.0–0.2)
Total Bilirubin: 0.5 mg/dL (ref 0.3–1.2)
Total Protein: 6.3 g/dL — ABNORMAL LOW (ref 6.5–8.1)

## 2020-06-09 LAB — TSH: TSH: 8.37 u[IU]/mL — ABNORMAL HIGH (ref 0.350–4.500)

## 2020-06-09 LAB — TROPONIN I (HIGH SENSITIVITY)
Troponin I (High Sensitivity): 9964 ng/L (ref <18)
Troponin I (High Sensitivity): 13546 ng/L (ref <18)
Troponin I (High Sensitivity): 25381 ng/L (ref <18)
Troponin I (High Sensitivity): 18199 ng/L (ref <18)

## 2020-06-09 LAB — HIV ANTIBODY (ROUTINE TESTING W REFLEX): HIV Screen 4th Generation wRfx: NONREACTIVE

## 2020-06-09 LAB — PREGNANCY, URINE: Preg Test, Ur: NEGATIVE

## 2020-06-09 SURGERY — LEFT HEART CATH AND CORONARY ANGIOGRAPHY
Anesthesia: LOCAL

## 2020-06-09 MED ORDER — ONDANSETRON HCL 4 MG/2ML IJ SOLN
4.0000 mg | Freq: Once | INTRAMUSCULAR | Status: AC
  Administered 2020-06-09: 4 mg via INTRAVENOUS
  Filled 2020-06-09: qty 2

## 2020-06-09 MED ORDER — ACETAMINOPHEN 325 MG PO TABS: 650.0000 mg | ORAL_TABLET | ORAL | Status: DC | PRN

## 2020-06-09 MED ORDER — ATORVASTATIN CALCIUM 80 MG PO TABS
80.0000 mg | ORAL_TABLET | Freq: Every day | ORAL | Status: DC
  Administered 2020-06-09 – 2020-06-10 (×2): 80 mg via ORAL
  Filled 2020-06-09 (×2): qty 1

## 2020-06-09 MED ORDER — CLOPIDOGREL BISULFATE 75 MG PO TABS
600.0000 mg | ORAL_TABLET | Freq: Once | ORAL | Status: DC
  Filled 2020-06-09: qty 8

## 2020-06-09 MED ORDER — MORPHINE SULFATE (PF) 4 MG/ML IV SOLN
4.0000 mg | Freq: Once | INTRAVENOUS | Status: AC
  Administered 2020-06-09: 4 mg via INTRAVENOUS
  Filled 2020-06-09: qty 1

## 2020-06-09 MED ORDER — SODIUM CHLORIDE 0.9 % WEIGHT BASED INFUSION: 1.0000 mL/kg/h | INTRAVENOUS | Status: DC

## 2020-06-09 MED ORDER — ALPRAZOLAM 0.25 MG PO TABS: 0.2500 mg | ORAL_TABLET | Freq: Two times a day (BID) | ORAL | Status: DC | PRN

## 2020-06-09 MED ORDER — MIDAZOLAM HCL 2 MG/2ML IJ SOLN
INTRAMUSCULAR | Status: AC
  Filled 2020-06-09: qty 2

## 2020-06-09 MED ORDER — ONDANSETRON HCL 4 MG/2ML IJ SOLN: 4.0000 mg | Freq: Four times a day (QID) | INTRAMUSCULAR | Status: DC | PRN

## 2020-06-09 MED ORDER — CLOPIDOGREL BISULFATE 75 MG PO TABS
ORAL_TABLET | ORAL | Status: AC
  Filled 2020-06-09: qty 1

## 2020-06-09 MED ORDER — FENTANYL CITRATE (PF) 100 MCG/2ML IJ SOLN
INTRAMUSCULAR | Status: DC | PRN
Start: 2020-06-09 — End: 2020-06-09
  Administered 2020-06-09: 50 ug via INTRAVENOUS

## 2020-06-09 MED ORDER — IOHEXOL 300 MG/ML  SOLN: 100.0000 mL | Freq: Once | INTRAMUSCULAR | Status: DC | PRN

## 2020-06-09 MED ORDER — NITROGLYCERIN 0.2 MG/ML ON CALL CATH LAB
INTRAVENOUS | Status: AC
  Filled 2020-06-09: qty 1

## 2020-06-09 MED ORDER — NITROGLYCERIN IN D5W 200-5 MCG/ML-% IV SOLN
5.0000 ug/min | INTRAVENOUS | Status: DC
  Administered 2020-06-09: 5 ug/min via INTRAVENOUS
  Filled 2020-06-09: qty 250

## 2020-06-09 MED ORDER — SODIUM CHLORIDE 0.9% FLUSH: 3.0000 mL | INTRAVENOUS | Status: DC | PRN

## 2020-06-09 MED ORDER — HEPARIN (PORCINE) IN NACL 1000-0.9 UT/500ML-% IV SOLN
INTRAVENOUS | Status: DC | PRN
  Administered 2020-06-09 (×2): 500 mL

## 2020-06-09 MED ORDER — ACETAMINOPHEN 325 MG PO TABS
650.0000 mg | ORAL_TABLET | ORAL | Status: DC | PRN
  Administered 2020-06-09: 650 mg via ORAL
  Filled 2020-06-09: qty 2

## 2020-06-09 MED ORDER — SODIUM CHLORIDE 0.9% FLUSH
3.0000 mL | Freq: Two times a day (BID) | INTRAVENOUS | Status: DC
  Administered 2020-06-10: 3 mL via INTRAVENOUS

## 2020-06-09 MED ORDER — NITROGLYCERIN 0.4 MG SL SUBL: 0.4000 mg | SUBLINGUAL_TABLET | SUBLINGUAL | Status: DC | PRN

## 2020-06-09 MED ORDER — CLOPIDOGREL BISULFATE 75 MG PO TABS
525.0000 mg | ORAL_TABLET | Freq: Once | ORAL | Status: AC
  Administered 2020-06-09: 525 mg via ORAL

## 2020-06-09 MED ORDER — ALBUTEROL SULFATE HFA 108 (90 BASE) MCG/ACT IN AERS
2.0000 | INHALATION_SPRAY | Freq: Four times a day (QID) | RESPIRATORY_TRACT | Status: DC | PRN
  Filled 2020-06-09: qty 6.7

## 2020-06-09 MED ORDER — ASPIRIN 81 MG PO CHEW
81.0000 mg | CHEWABLE_TABLET | Freq: Every day | ORAL | Status: DC
  Administered 2020-06-10: 81 mg via ORAL
  Filled 2020-06-09: qty 1

## 2020-06-09 MED ORDER — VERAPAMIL HCL 2.5 MG/ML IV SOLN
INTRAVENOUS | Status: AC
  Filled 2020-06-09: qty 2

## 2020-06-09 MED ORDER — AEROCHAMBER Z-STAT PLUS/MEDIUM MISC
1.0000 | Freq: Once | Status: AC
  Administered 2020-06-09: 1

## 2020-06-09 MED ORDER — SODIUM CHLORIDE 0.9 % IV SOLN: INTRAVENOUS | Status: AC

## 2020-06-09 MED ORDER — HEPARIN (PORCINE) 25000 UT/250ML-% IV SOLN
1000.0000 [IU]/h | INTRAVENOUS | Status: DC
  Administered 2020-06-09: 1000 [IU]/h via INTRAVENOUS
  Filled 2020-06-09: qty 250

## 2020-06-09 MED ORDER — LIDOCAINE HCL (PF) 1 % IJ SOLN
INTRAMUSCULAR | Status: AC
  Filled 2020-06-09: qty 30

## 2020-06-09 MED ORDER — ENOXAPARIN SODIUM 40 MG/0.4ML ~~LOC~~ SOLN
40.0000 mg | SUBCUTANEOUS | Status: DC
  Administered 2020-06-10: 40 mg via SUBCUTANEOUS
  Filled 2020-06-09: qty 0.4

## 2020-06-09 MED ORDER — ZOLPIDEM TARTRATE 5 MG PO TABS: 5.0000 mg | ORAL_TABLET | Freq: Every evening | ORAL | Status: DC | PRN

## 2020-06-09 MED ORDER — ASPIRIN 325 MG PO TABS
325.0000 mg | ORAL_TABLET | Freq: Once | ORAL | Status: AC
  Administered 2020-06-09: 325 mg via ORAL
  Filled 2020-06-09: qty 1

## 2020-06-09 MED ORDER — HEPARIN SODIUM (PORCINE) 1000 UNIT/ML IJ SOLN
INTRAMUSCULAR | Status: DC | PRN
  Administered 2020-06-09: 4000 [IU] via INTRAVENOUS

## 2020-06-09 MED ORDER — CARVEDILOL 3.125 MG PO TABS
3.1250 mg | ORAL_TABLET | Freq: Two times a day (BID) | ORAL | Status: DC
  Administered 2020-06-09 – 2020-06-10 (×2): 3.125 mg via ORAL
  Filled 2020-06-09 (×2): qty 1

## 2020-06-09 MED ORDER — ATORVASTATIN CALCIUM 80 MG PO TABS: 80.0000 mg | ORAL_TABLET | Freq: Every day | ORAL | Status: DC

## 2020-06-09 MED ORDER — SODIUM CHLORIDE 0.9% FLUSH
3.0000 mL | Freq: Two times a day (BID) | INTRAVENOUS | Status: DC
  Administered 2020-06-09 – 2020-06-10 (×2): 3 mL via INTRAVENOUS

## 2020-06-09 MED ORDER — HEPARIN BOLUS VIA INFUSION
4000.0000 [IU] | Freq: Once | INTRAVENOUS | Status: AC
  Administered 2020-06-09: 4000 [IU] via INTRAVENOUS

## 2020-06-09 MED ORDER — IOHEXOL 350 MG/ML SOLN
100.0000 mL | Freq: Once | INTRAVENOUS | Status: AC | PRN
  Administered 2020-06-09: 100 mL via INTRAVENOUS

## 2020-06-09 MED ORDER — SODIUM CHLORIDE 0.9 % IV SOLN: 250.0000 mL | INTRAVENOUS | Status: DC | PRN

## 2020-06-09 MED ORDER — LEVOTHYROXINE SODIUM 112 MCG PO TABS
112.0000 ug | ORAL_TABLET | Freq: Every day | ORAL | Status: DC
  Administered 2020-06-09 – 2020-06-10 (×2): 112 ug via ORAL
  Filled 2020-06-09 (×6): qty 1

## 2020-06-09 MED ORDER — HEPARIN (PORCINE) IN NACL 1000-0.9 UT/500ML-% IV SOLN
INTRAVENOUS | Status: AC
  Filled 2020-06-09: qty 1000

## 2020-06-09 MED ORDER — SODIUM CHLORIDE 0.9 % WEIGHT BASED INFUSION
3.0000 mL/kg/h | INTRAVENOUS | Status: DC
  Administered 2020-06-09: 3 mL/kg/h via INTRAVENOUS

## 2020-06-09 MED ORDER — MIDAZOLAM HCL 2 MG/2ML IJ SOLN
INTRAMUSCULAR | Status: DC | PRN
  Administered 2020-06-09: 1 mg via INTRAVENOUS

## 2020-06-09 MED ORDER — LIDOCAINE HCL (PF) 1 % IJ SOLN
INTRAMUSCULAR | Status: DC | PRN
  Administered 2020-06-09: 2 mL

## 2020-06-09 MED ORDER — FENTANYL CITRATE (PF) 100 MCG/2ML IJ SOLN
25.0000 ug | Freq: Once | INTRAMUSCULAR | Status: AC
  Administered 2020-06-09: 25 ug via INTRAVENOUS
  Filled 2020-06-09: qty 2

## 2020-06-09 MED ORDER — FENTANYL CITRATE (PF) 100 MCG/2ML IJ SOLN
INTRAMUSCULAR | Status: AC
  Filled 2020-06-09: qty 2

## 2020-06-09 MED ORDER — CLOPIDOGREL BISULFATE 75 MG PO TABS
75.0000 mg | ORAL_TABLET | Freq: Every day | ORAL | Status: DC
  Administered 2020-06-10: 75 mg via ORAL
  Filled 2020-06-09: qty 1

## 2020-06-09 MED ORDER — VERAPAMIL HCL 2.5 MG/ML IV SOLN
INTRAVENOUS | Status: DC | PRN
  Administered 2020-06-09: 10 mL via INTRA_ARTERIAL

## 2020-06-09 SURGICAL SUPPLY — 9 items
CATH INFINITI 5FR JK (CATHETERS) ×2 IMPLANT
DEVICE RAD COMP TR BAND LRG (VASCULAR PRODUCTS) ×2 IMPLANT
GLIDESHEATH SLEND SS 6F .021 (SHEATH) ×2 IMPLANT
GUIDEWIRE INQWIRE 1.5J.035X260 (WIRE) ×1 IMPLANT
INQWIRE 1.5J .035X260CM (WIRE) ×2
KIT HEART LEFT (KITS) ×2 IMPLANT
PACK CARDIAC CATHETERIZATION (CUSTOM PROCEDURE TRAY) ×2 IMPLANT
TRANSDUCER W/STOPCOCK (MISCELLANEOUS) ×2 IMPLANT
TUBING CIL FLEX 10 FLL-RA (TUBING) ×2 IMPLANT

## 2020-06-09 NOTE — ED Provider Notes (Addendum)
[facility] EMERGENCY DEPARTMENT Provider Note   CSN: [id number] Arrival date & time: 06/09/20  0049    Time seen 3:40 AM  History Chief Complaint  Patient presents with  . Chest Pain    Linda Lin is a 49 y.o. female.  HPI   Patient states she started having central chest pain 24 hours ago at 2:30 in the morning that woke her up from sleep.  The pain has been there constantly.  It is in the center of her chest and she describes it as aching.  Nothing she does makes it feel worse, nothing she does makes it feel better.  She is only tried Tums without relief.  She states she started feeling a little short of breath in the ED waiting room and having some mild nausea but no vomiting.  She denies cough, rhinorrhea, sore throat, diarrhea, or loss of sense of taste or smell.  She states the pain radiates into her back across her shoulders and up into her neck and she has achiness in her left arm.  Patient states she has never had this before.  She states she thinks her maternal grandmother had some type of heart problem but she does not know what kind.  Patient does smoke 1/3 pack a day.  She denies history of hypertension, diabetes, or high cholesterol but states she was seen by Dr. Aline Brochure, orthopedist on Monday, September 13 to get cleared to go back to work to full duty.  She states she noted at that time her blood pressure was 157/104.  Patient has not taken the Covid vaccine  PCP Patient, No Pcp Per   Past Medical History:  Diagnosis Date  . Asthma   . Cellulitis   . Scabies   . Thyroid disorder     Patient Active Problem List   Diagnosis Date Noted  . NSTEMI (non-ST elevated myocardial infarction) ([place]) 06/09/2020  . Hypothyroidism following radioiodine therapy 02/24/2018  . Graves' disease 09/19/2017  . Contact dermatitis 12/21/2015  . Bilateral lower leg cellulitis, recurrent 12/19/2015  . Lower extremity edema 12/19/2015  . History of asthma, mild 12/19/2015  .  Maculopapular rash, generalized 08/30/2015  . Tobacco abuse 08/30/2015  . Cellulitis of both lower extremities 08/27/2015    History reviewed. No pertinent surgical history.   OB History    Gravida  0   Para  0   Term  0   Preterm  0   AB  0   Living  0     SAB  0   TAB  0   Ectopic  0   Multiple  0   Live Births              Family History  Problem Relation Age of Onset  . Asthma Mother   . Diabetes Father   . Kidney failure Father   . Hypertension Sister   . Diabetes Mellitus II Brother   . Cancer Neg Hx     Social History   Tobacco Use  . Smoking status: Current Every Day Smoker    Packs/day: 0.25    Years: 20.00    Pack years: 5.00    Types: Cigarettes  . Smokeless tobacco: Never Used  Vaping Use  . Vaping Use: Never used  Substance Use Topics  . Alcohol use: Yes    Comment: occ  . Drug use: Not Currently    Types: Marijuana    Comment: sporadically-last used sept 2019  employed  Home Medications Prior to Admission medications   Medication Sig Start Date End Date Taking? Authorizing Provider  acetaminophen (TYLENOL) 500 MG tablet Take 500 mg by mouth every 6 (six) hours as needed for mild pain or moderate pain.     [provider]  furosemide (LASIX) 20 MG tablet Take 1 tablet (20 mg total) by mouth daily for 7 days. 05/25/20 06/01/20  Mesner, Corene Cornea, MD  hydrOXYzine (ATARAX/VISTARIL) 25 MG tablet Take 1 tablet (25 mg total) by mouth every 8 (eight) hours as needed for itching. 05/25/20   Mesner, Corene Cornea, MD  levothyroxine (SYNTHROID) 112 MCG tablet Take 1 tablet (112 mcg total) by mouth daily before breakfast for 180 doses. 01/04/20 07/02/20  Cassandria Anger, MD  naproxen (NAPROSYN) 500 MG tablet Take 1 tablet (500 mg total) by mouth 2 (two) times daily as needed for moderate pain. 06/02/19   Long, Wonda Olds, MD  triamcinolone cream (KENALOG) 0.1 % Apply 1 application topically 2 (two) times daily. 05/25/20   Mesner, Corene Cornea, MD     Allergies    Cephalexin and Doxycycline  Review of Systems   Review of Systems  All other systems reviewed and are negative.   Physical Exam Updated Vital Signs BP 135/82   Pulse (!) 56   Temp 98.9 F (37.2 C)   Resp 16   Ht _0  (1.575 m)   Wt 79.4 kg   SpO2 94%   BMI 32.01 kg/m   Physical Exam Vitals and nursing note reviewed.  Constitutional:      General: She is not in acute distress.    Appearance: Normal appearance. She is normal weight. She is not ill-appearing or toxic-appearing.  HENT:     Head: Normocephalic and atraumatic.     Right Ear: External ear normal.     Left Ear: External ear normal.  Eyes:     Extraocular Movements: Extraocular movements intact.     Conjunctiva/sclera: Conjunctivae normal.     Pupils: Pupils are equal, round, and reactive to light.  Cardiovascular:     Rate and Rhythm: Normal rate and regular rhythm.     Pulses: Normal pulses.     Heart sounds: Normal heart sounds. No murmur heard.   Pulmonary:     Effort: Pulmonary effort is normal.     Comments: Patient has a few rhonchi especially at the bases.  Patient is aware of it. Chest:     Chest wall: No tenderness.       Comments: Area of pain noted however she is nontender. Musculoskeletal:        General: No swelling or tenderness.     Cervical back: Normal range of motion.     Comments: When I palpate her upper back she is nontender to palpation anywhere including the midline thoracic spine or around the scapula.  She states that is where she has pain but it does not hurt to palpate it.  Skin:    General: Skin is warm and dry.  Neurological:     General: No focal deficit present.     Mental Status: She is alert and oriented to person, place, and time.     Cranial Nerves: No cranial nerve deficit.  Psychiatric:        Mood and Affect: Mood normal.        Behavior: Behavior normal.        Thought Content: Thought content normal.     ED Results / Procedures /  Treatments  Labs (all labs ordered are listed, but only abnormal results are displayed) Results for orders placed or performed during the hospital encounter of 06/09/20  SARS Coronavirus 2 by RT PCR (hospital order, performed in [facility] hospital lab) Nasopharyngeal Nasopharyngeal Swab   Specimen: Nasopharyngeal Swab  Result Value Ref Range   SARS Coronavirus 2 NEGATIVE NEGATIVE  Basic metabolic panel  Result Value Ref Range   Sodium 135 135 - 145 mmol/L   Potassium 4.1 3.5 - 5.1 mmol/L   Chloride 103 98 - 111 mmol/L   CO2 25 22 - 32 mmol/L   Glucose, Bld 141 (H) 70 - 99 mg/dL   BUN 8 6 - 20 mg/dL   Creatinine, Ser 0.64 0.44 - 1.00 mg/dL   Calcium 10.0 8.9 - 10.3 mg/dL   GFR calc non Af Amer >60 >60 mL/min   GFR calc Af Amer >60 >60 mL/min   Anion gap 7 5 - 15  CBC  Result Value Ref Range   WBC 9.9 4.0 - 10.5 K/uL   RBC 3.89 3.87 - 5.11 MIL/uL   Hemoglobin 12.6 12.0 - 15.0 g/dL   HCT 39.7 36 - 46 %   MCV 102.1 (H) 80.0 - 100.0 fL   MCH 32.4 26.0 - 34.0 pg   MCHC 31.7 30.0 - 36.0 g/dL   RDW 13.6 11.5 - 15.5 %   Platelets 311 150 - 400 K/uL   nRBC 0.0 0.0 - 0.2 %  Troponin I (High Sensitivity)  Result Value Ref Range   Troponin I (High Sensitivity) 9,964 (HH) <18 ng/L  Troponin I (High Sensitivity)  Result Value Ref Range   Troponin I (High Sensitivity) 13,546 (HH) <18 ng/L   Laboratory interpretation all normal except very elevated D-dimer    EKG EKG Interpretation  Date/Time:  Friday June 09 2020 01:46:23 EDT Ventricular Rate:  66 PR Interval:  108 QRS Duration: 82 QT Interval:  358 QTC Calculation: 375 R Axis:   81 Text Interpretation: Sinus rhythm with short PR Nonspecific T wave abnormality Since last tracing rate slower 11 Jul 2017 Confirmed by Rolland Porter [phone]) on 06/09/2020 3:15:03 AM   EKG Interpretation  Date/Time:  Friday June 09 2020 14:43:15 EDT Ventricular Rate:  65 PR Interval:  108 QRS Duration: 93 QT Interval:  422 QTC  Calculation: 439 R Axis:   82 Text Interpretation: Sinus rhythm Borderline T abnormalities, inferior leads Minimal ST elevation, inferior leads T-wave inversion in Inferior leads Confirmed by Rolland Porter [phone]) on 06/09/2020 5:47:14 AM        Radiology DG Chest 2 View  Result Date: 06/09/2020 CLINICAL DATA:  Chest pain radiating into back and shoulder, asthma, tobacco abuse EXAM: CHEST - 2 VIEW COMPARISON:  02/10/2019 FINDINGS: The heart size and mediastinal contours are within normal limits. Both lungs are clear. The visualized skeletal structures are unremarkable. IMPRESSION: No active cardiopulmonary disease. Electronically Signed   By: Randa Ngo M.D.   On: 06/09/2020 02:49   CT Angio Chest/Abd/Pel for Dissection W and/or W/WO  Result Date: 06/09/2020 CLINICAL DATA:  Chest pain that radiates to back and shoulders. EXAM: CT ANGIOGRAPHY CHEST, ABDOMEN AND PELVIS TECHNIQUE: Non-contrast CT of the chest was initially obtained. Multidetector CT imaging through the chest, abdomen and pelvis was performed using the standard protocol during bolus administration of intravenous contrast. Multiplanar reconstructed images and MIPs were obtained and reviewed to evaluate the vascular anatomy. CONTRAST:  156m OMNIPAQUE IOHEXOL 350 MG/ML SOLN COMPARISON:  Abdomen and pelvis CT 06/08/2018 FINDINGS:  CTA CHEST FINDINGS Cardiovascular: No intramural hematoma on noncontrast phase. Normal heart size. Trace low-density anterior pericardial fluid. No aortic aneurysm or dissection. The great vessels are enhancing. No pulmonary artery filling defects. Mediastinum/Nodes: Negative for adenopathy or mass. Lungs/Pleura: Generalized airway thickening. There is no edema, consolidation, effusion, or pneumothorax. Musculoskeletal: No acute or aggressive finding Review of the MIP images confirms the above findings. CTA ABDOMEN AND PELVIS FINDINGS VASCULAR Aorta: No aneurysm or dissection. Mild distal aortic atheromatous wall  thickening. Celiac: Patent without evidence of aneurysm, dissection, vasculitis or significant stenosis. SMA: Patent without evidence of aneurysm, dissection, vasculitis or significant stenosis. Renals: Solitary bilateral renal arteries with are smooth and widely patent. IMA: Patent Inflow: Mild atheromatous plaque.  No dissection or aneurysm. Veins: Unremarkable in the arterial phase. Review of the MIP images confirms the above findings. NON-VASCULAR Hepatobiliary: No focal liver abnormality.No evidence of biliary obstruction or stone. Pancreas: Unremarkable. Spleen: Granulomatous calcifications. Adrenals/Urinary Tract: Negative adrenals. No hydronephrosis or stone. Unremarkable bladder. Stomach/Bowel:  No obstruction. No appendicitis. Lymphatic: No acute vascular abnormality.  No mass or adenopathy. Reproductive:No pathologic findings. Other: No ascites or pneumoperitoneum.  Fatty umbilical hernia. Musculoskeletal: No acute abnormalities. Review of the MIP images confirms the above findings. IMPRESSION: 1. Negative for acute aortic syndrome. 2. Mild atherosclerotic calcification of the aorta. No aortic aneurysm. 3. Generalized airway thickening, likely smoking-related. Electronically Signed   By: Monte Fantasia M.D.   On: 06/09/2020 05:42    Procedures .Critical Care Performed by: Rolland Porter, MD Authorized by: Rolland Porter, MD   Critical care provider statement:    Critical care time (minutes):  42   Critical care was necessary to treat or prevent imminent or life-threatening deterioration of the following conditions:  Cardiac failure   Critical care was time spent personally by me on the following activities:  Discussions with consultants, examination of patient, obtaining history from patient or surrogate, ordering and review of laboratory studies, ordering and review of radiographic studies, pulse oximetry and re-evaluation of patient's condition   (including critical care time)  Medications  Ordered in ED Medications  albuterol (VENTOLIN HFA) 108 (90 Base) MCG/ACT inhaler 2 puff (has no administration in time range)  nitroGLYCERIN 50 mg in dextrose 5 % 250 mL (0.2 mg/mL) infusion (40 mcg/min Intravenous Rate/Dose Change 06/09/20 0608)  heparin ADULT infusion 100 units/mL (25000 units/240m sodium chloride 0.45%) (1,000 Units/hr Intravenous New Bag/Given 06/09/20 0628)  ondansetron (ZOFRAN) injection 4 mg (4 mg Intravenous Given 06/09/20 0457)  fentaNYL (SUBLIMAZE) injection 25 mcg (25 mcg Intravenous Given 06/09/20 0457)  aerochamber Z-Stat Plus/medium 1 each (1 each Other Given 06/09/20 0509)  morphine 4 MG/ML injection 4 mg (4 mg Intravenous Given 06/09/20 0501)  iohexol (OMNIPAQUE) 350 MG/ML injection 100 mL (100 mLs Intravenous Contrast Given 06/09/20 0515)  aspirin tablet 325 mg (325 mg Oral Given 06/09/20 0606)  heparin bolus via infusion 4,000 Units (4,000 Units Intravenous Bolus from Bag 06/09/20 0629)    ED Course  I have reviewed the triage vital signs and the nursing notes.  Pertinent labs & imaging results that were available during my care of the patient were reviewed by me and considered in my medical decision making (see chart for details).    MDM Rules/Calculators/A&P                          Patient reports acute onset of central chest pain that radiates into her upper back that started about 24 hours  ago.  She has had some mild hypertension but has minimal other risk factors for heart disease other than smoking.  CTA was done to look for dissection.  Patient was given albuterol inhaler for her rhonchi.  We discussed that she needs to quit smoking.  4:57 AM patient's troponin is extremely elevated.  She was started on a nitroglycerin drip and given morphine for pain.  She still waiting to have her CTA done.  Covid testing was done.  5:25 AM repeat EKG was done.  5:55 AM Dr. Launa Grill, cardiology at [facility] has looked at patient's EKG and wants the hospitalist to admit  and they will take to the Cath Lab this morning.  She did not feel like patient met criteria for STEMI.  6:04 AM Dr. Launa Grill called back, she states that she is reviewed the chart more thoroughly and now wants the patient admitted to their service.  She will be the admitting physician and Dr. Golden Hurter will be the attending physician.  6:05 AM I have talked to the patient.  She states her currently her pain is a 2 out of 10.  She is on the nitroglycerin drip, nurses in the room currently to titrated for more pain relief.  Her blood pressure is 132/79 heart rate 78.  She is agreeable with the admission.  6:45 AM Dr Clearence Ped, hospitalist called back and she was informed the patient was could be admitted to cardiology.  There are not to be any telemetry beds available at [facility] anytime soon.   7:03 AM Dr. Betsey Holiday, [facility], ED accepts in transfer to [facility] ED so she can see cardiology and get her cardiac cath this morning.  7:10 AM [facility] says it was globally old long while before they can come pick patient up.  We will contact [facility] to see if they can transport to [facility] ED.  Final Clinical Impression(s) / ED Diagnoses Final diagnoses:  NSTEMI (non-ST elevated myocardial infarction) [facility])    Rx / DC Orders  Plan admission  Rolland Porter, MD, Barbette Or, MD 06/09/20 [id number]    Rolland Porter, MD 06/09/20 8469

## 2020-06-09 NOTE — ED Notes (Signed)
Verlon Au said "I don't have time to take report, I will see her when she gets here."

## 2020-06-09 NOTE — Discharge Instructions (Signed)
Post NSTEMI: °NO HEAVY LIFTING X 2 WEEKS. °NO SEXUAL ACTIVITY X 2 WEEKS. °NO DRIVING X 1 WEEK. °NO SOAKING BATHS, HOT TUBS, POOLS, ETC., X 7 DAYS. ° °Radial Site Care: °Refer to this sheet in the next few weeks. These instructions provide you with information on caring for yourself after your procedure. Your caregiver may also give you more specific instructions. Your treatment has been planned according to current medical practices, but problems sometimes occur. Call your caregiver if you have any problems or questions after your procedure. °HOME CARE INSTRUCTIONS °· You may shower the day after the procedure. Remove the bandage (dressing) and gently wash the site with plain soap and water. Gently pat the site dry.  °· Do not apply powder or lotion to the site.  °· Do not submerge the affected site in water for 3 to 5 days.  °· Inspect the site at least twice daily.  °· Do not flex or bend the affected arm for 24 hours.  °· No lifting over 5 pounds (2.3 kg) for 5 days after your procedure.  °· Do not drive home if you are discharged the same day of the procedure. Have someone else drive you.  °What to expect: °· Any bruising will usually fade within 1 to 2 weeks.  °· Blood that collects in the tissue (hematoma) may be painful to the touch. It should usually decrease in size and tenderness within 1 to 2 weeks.  °SEEK IMMEDIATE MEDICAL CARE IF: °· You have unusual pain at the radial site.  °· You have redness, warmth, swelling, or pain at the radial site.  °· You have drainage (other than a small amount of blood on the dressing).  °· You have chills.  °· You have a fever or persistent symptoms for more than 72 hours.  °· You have a fever and your symptoms suddenly get worse.  °· Your arm becomes pale, cool, tingly, or numb.  °· You have heavy bleeding from the site. Hold pressure on the site.  ° ° °

## 2020-06-09 NOTE — ED Notes (Signed)
CRITICAL VALUE ALERT  Critical Value:  Troponin 13,546  Date & Time Notied:  06/09/2020 0725  Provider Notified: Dr. Lynelle Doctor notified and she notified ED physician   Orders Received/Actions taken: no/na

## 2020-06-09 NOTE — Progress Notes (Signed)
Echocardiogram 2D Echocardiogram has been performed.  Linda Lin Linda Lin 06/09/2020, 3:26 PM

## 2020-06-09 NOTE — ED Notes (Signed)
Patient transported to CT 

## 2020-06-09 NOTE — ED Notes (Signed)
Report given to Nicole with [facility] ?

## 2020-06-09 NOTE — Progress Notes (Signed)
ANTICOAGULATION CONSULT NOTE - Initial Consult  Pharmacy Consult for heparin Indication: chest pain/ACS  Allergies  Allergen Reactions  . Cephalexin Rash  . Doxycycline Rash    Patient Measurements: Height: 5\' 2"  (157.5 cm) Weight: 79.4 kg (175 lb) IBW/kg (Calculated) : 50.1 Heparin Dosing Weight: 70kg  Vital Signs: Temp: 98.9 F (37.2 C) (09/17 0147) BP: 132/79 (09/17 0600) Pulse Rate: 58 (09/17 0600)  Labs: Recent Labs    06/09/20 0417  HGB 12.6  HCT 39.7  PLT 311  CREATININE 0.64  TROPONINIHS 9,964*    Estimated Creatinine Clearance: 82.1 mL/min (by C-G formula based on SCr of 0.64 mg/dL).   Medical History: Past Medical History:  Diagnosis Date  . Asthma   . Cellulitis   . Scabies   . Thyroid disorder     Assessment: 49yo female c/o CP radiating to shoulders and back, initial troponin elevated, to begin heparin.  Goal of Therapy:  Heparin level 0.3-0.7 units/ml Monitor platelets by anticoagulation protocol: Yes   Plan:  Will give heparin 4000 units IV bolus x1 followed by gtt at 1000 units/hr and monitor heparin levels and CBC.  49yo, PharmD, BCPS  06/09/2020,6:10 AM

## 2020-06-09 NOTE — H&P (Addendum)
Cardiology Admission History and Physical:   Patient ID: Linda Lin; MRN: [id number]; DOB: 05-23-1971   Admission date: 06/09/2020  Primary Care Provider: Patient, No Pcp Per Primary Cardiologist: No primary care provider on file. New, f/u [facility] Primary Electrophysiologist: None    Chief Complaint:  NSTEMI  Patient Profile:   Linda Lin is a 49 y.o. female with a history of cellulitis in her right lower extremity, hypothyroidism, tobacco use, family history of coronary artery disease in her grandmother.  She has no known history of hypertension, hyperlipidemia, or diabetes.  History of Present Illness:   Ms. Lin Linda Lin was awakened from a sound sleep yesterday afternoon.  She works nights, so was sleeping.  The pain was in her chest, and went through to her back.  It also went across her shoulders and up into her neck.  She was a little short of breath with it.  She has never had this before.  She got up, got dressed, and went to work.  She was very tired at work and did not feel her usual self.  The chest pain was a 10/10 the whole time.  She did not take any medications for it.  She did not tell anyone about it.  She got off work at [employer] AM, and went home.  The pain was ongoing.  She was weak and miserable.  She finally came to the emergency room at about 2:30 in the afternoon.  Her blood pressure was elevated at 157/97.  Her initial troponin was almost 10,000.  She was given aspirin 325 mg, heparin, IV nitroglycerin, Zofran, morphine, and fentanyl.  Her chest pain improved and she is currently pain-free.  She has never had anything like this before.  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  However, she has never had chest pain or shortness of breath with exertion.  She has never had any kind of surgery.   Past Medical History:  Diagnosis Date  . Asthma   . Cellulitis   . Scabies   . Thyroid disorder     History reviewed.  No pertinent surgical history.   Medications Prior to Admission: Prior to Admission medications   Medication Sig Start Date End Date Taking? Authorizing Provider  acetaminophen (TYLENOL) 500 MG tablet Take 500 mg by mouth every 6 (six) hours as needed for mild pain or moderate pain.    Yes [provider]  levothyroxine (SYNTHROID) 112 MCG tablet Take 1 tablet (112 mcg total) by mouth daily before breakfast for 180 doses. 01/04/20 07/02/20 Yes Nida, Denman George, MD  naproxen (NAPROSYN) 500 MG tablet Take 1 tablet (500 mg total) by mouth 2 (two) times daily as needed for moderate pain. 06/02/19  Yes Long, Arlyss Repress, MD  furosemide (LASIX) 20 MG tablet Take 1 tablet (20 mg total) by mouth daily for 7 days. 05/25/20 06/01/20  Mesner, Barbara Cower, MD  hydrOXYzine (ATARAX/VISTARIL) 25 MG tablet Take 1 tablet (25 mg total) by mouth every 8 (eight) hours as needed for itching. Patient not taking: Reported on 06/09/2020 05/25/20   Mesner, Barbara Cower, MD  triamcinolone cream (KENALOG) 0.1 % Apply 1 application topically 2 (two) times daily. Patient not taking: Reported on 06/09/2020 05/25/20   Marily Memos, MD     Allergies:    Allergies  Allergen Reactions  . Cephalexin Rash  . Doxycycline Rash    Social History:   Social History   Socioeconomic History  . Marital status: Single  Spouse name: Not on file  . Number of children: Not on file  . Years of education: Not on file  . Highest education level: Not on file  Occupational History  . Occupation: Landscape architect: [employer]  Tobacco Use  . Smoking status: Current Every Day Smoker    Packs/day: 0.25    Years: 20.00    Pack years: 5.00    Types: Cigarettes  . Smokeless tobacco: Never Used  Vaping Use  . Vaping Use: Never used  Substance and Sexual Activity  . Alcohol use: Yes    Comment: occ  . Drug use: Not Currently    Types: Marijuana    Comment: sporadically-last used sept 2019  . Sexual activity: Not Currently     Partners: Male    Birth control/protection: None  Other Topics Concern  . Not on file  Social History Narrative   Pt lives w/ her sister.   Supervisor.    Social Determinants of Health   Financial Resource Strain:   . Difficulty of Paying Living Expenses: Not on file  Food Insecurity:   . Worried About Programme researcher, broadcasting/film/video in the Last Year: Not on file  . Ran Out of Food in the Last Year: Not on file  Transportation Needs:   . Lack of Transportation (Medical): Not on file  . Lack of Transportation (Non-Medical): Not on file  Physical Activity:   . Days of Exercise per Week: Not on file  . Minutes of Exercise per Session: Not on file  Stress:   . Feeling of Stress : Not on file  Social Connections:   . Frequency of Communication with Friends and Family: Not on file  . Frequency of Social Gatherings with Friends and Family: Not on file  . Attends Religious Services: Not on file  . Active Member of Clubs or Organizations: Not on file  . Attends Banker Meetings: Not on file  . Marital Status: Not on file  Intimate Partner Violence:   . Fear of Current or Ex-Partner: Not on file  . Emotionally Abused: Not on file  . Physically Abused: Not on file  . Sexually Abused: Not on file    Family History:  The patient's family history includes Asthma in her mother; CAD in her maternal grandmother; Diabetes in her father; Diabetes Mellitus II in her brother; Hypertension in her brother and sister; Kidney failure in her father. There is no history of Cancer.   The patient She indicated that her mother is deceased. She indicated that her father is deceased. She indicated that both of her sisters are alive. She indicated that her brother is alive. She indicated that her maternal grandmother is deceased. She indicated that the status of her neg hx is unknown.   ROS:  Please see the history of present illness.  All other ROS reviewed and negative.     Physical Exam/Data:    Vitals:   06/09/20 0615 06/09/20 0630 06/09/20 0645 06/09/20 0700  BP: 136/80 124/87 135/82 127/76  Pulse: 64 60 (!) 56 (!) 55  Resp: 15 13 16 16   Temp:      SpO2: 97% 97% 94% 95%  Weight:      Height:       No intake or output data in the 24 hours ending 06/09/20 0819 Filed Weights   06/09/20 0607  Weight: 79.4 kg   Body mass index is 32.01 kg/m.  General:  Well nourished, well  developed, female in no acute distress HEENT: normal Lymph: no adenopathy Neck:  JVD not elevated Endocrine:  No thryomegaly Vascular: No carotid bruits; 4/4 extremity pulses 2+ bilaterally  Cardiac:  normal S1, S2; RRR; no murmur, no rub or gallop  Lungs:  clear to auscultation bilaterally, no wheezing, rhonchi or rales  Abd: soft, nontender, no hepatomegaly  Ext: no edema Musculoskeletal:  No deformities, BUE and BLE strength normal and equal Skin: warm and dry  Neuro:  CNs 2-12 intact, no focal abnormalities noted Psych:  Normal affect    EKG:  The ECG was personally reviewed: Sinus rhythm, heart rate 65 Borderline T abnormalities, inferior leads Minimal ST elevation, inferior leads T-wave inversion in Inferior leads Confirmed by Devoria Albe ([id number])  Telemetry: Sinus rhythm, sinus bradycardia 50s  Relevant CV Studies:  None  Laboratory Data:  Chemistry Recent Labs  Lab 06/09/20 0417  NA 135  K 4.1  CL 103  CO2 25  GLUCOSE 141*  BUN 8  CREATININE 0.64  CALCIUM 10.0  GFRNONAA >60  GFRAA >60  ANIONGAP 7    No results for input(s): PROT, ALBUMIN, AST, ALT, ALKPHOS, BILITOT in the last 168 hours. Hematology Recent Labs  Lab 06/09/20 0417  WBC 9.9  RBC 3.89  HGB 12.6  HCT 39.7  MCV 102.1*  MCH 32.4  MCHC 31.7  RDW 13.6  PLT 311   Cardiac Enzymes  High Sensitivity Troponin:   Recent Labs  Lab 06/09/20 0417 06/09/20 0628  TROPONINIHS 9,964* 13,546*     BNPNo results for input(s): BNP, PROBNP in the last 168 hours.  DDimer No results for input(s): DDIMER in  the last 168 hours. Lipids: No results found for: CHOL, HDL, LDLCALC, LDLDIRECT, TRIG, CHOLHDL INR: No results found for: INR, PROTIME A1c: No results found for: HGBA1C Thyroid:  Lab Results  Component Value Date   TSH 15.83 (H) 12/28/2019   T4TOTAL 31.9 (H) 06/26/2017    Radiology/Studies:  DG Chest 2 View  Result Date: 06/09/2020 CLINICAL DATA:  Chest pain radiating into back and shoulder, asthma, tobacco abuse EXAM: CHEST - 2 VIEW COMPARISON:  02/10/2019 FINDINGS: The heart size and mediastinal contours are within normal limits. Both lungs are clear. The visualized skeletal structures are unremarkable. IMPRESSION: No active cardiopulmonary disease. Electronically Signed   By: Sharlet Salina M.D.   On: 06/09/2020 02:49   CT Angio Chest/Abd/Pel for Dissection W and/or W/WO  Result Date: 06/09/2020 CLINICAL DATA:  Chest pain that radiates to back and shoulders. EXAM: CT ANGIOGRAPHY CHEST, ABDOMEN AND PELVIS TECHNIQUE: Non-contrast CT of the chest was initially obtained. Multidetector CT imaging through the chest, abdomen and pelvis was performed using the standard protocol during bolus administration of intravenous contrast. Multiplanar reconstructed images and MIPs were obtained and reviewed to evaluate the vascular anatomy. CONTRAST:  OMNIPAQUE IOHEXOL 350 MG/ML SOLN COMPARISON:  Abdomen and pelvis CT 06/08/2018 FINDINGS: CTA CHEST FINDINGS Cardiovascular: No intramural hematoma on noncontrast phase. Normal heart size. Trace low-density anterior pericardial fluid. No aortic aneurysm or dissection. The great vessels are enhancing. No pulmonary artery filling defects. Mediastinum/Nodes: Negative for adenopathy or mass. Lungs/Pleura: Generalized airway thickening. There is no edema, consolidation, effusion, or pneumothorax. Musculoskeletal: No acute or aggressive finding Review of the MIP images confirms the above findings. CTA ABDOMEN AND PELVIS FINDINGS VASCULAR Aorta: No aneurysm or  dissection. Mild distal aortic atheromatous wall thickening. Celiac: Patent without evidence of aneurysm, dissection, vasculitis or significant stenosis. SMA: Patent without evidence of aneurysm, dissection, vasculitis  or significant stenosis. Renals: Solitary bilateral renal arteries with are smooth and widely patent. IMA: Patent Inflow: Mild atheromatous plaque.  No dissection or aneurysm. Veins: Unremarkable in the arterial phase. Review of the MIP images confirms the above findings. NON-VASCULAR Hepatobiliary: No focal liver abnormality.No evidence of biliary obstruction or stone. Pancreas: Unremarkable. Spleen: Granulomatous calcifications. Adrenals/Urinary Tract: Negative adrenals. No hydronephrosis or stone. Unremarkable bladder. Stomach/Bowel:  No obstruction. No appendicitis. Lymphatic: No acute vascular abnormality.  No mass or adenopathy. Reproductive:No pathologic findings. Other: No ascites or pneumoperitoneum.  Fatty umbilical hernia. Musculoskeletal: No acute abnormalities. Review of the MIP images confirms the above findings. IMPRESSION: 1. Negative for acute aortic syndrome. 2. Mild atherosclerotic calcification of the aorta. No aortic aneurysm. 3. Generalized airway thickening, likely smoking-related. Electronically Signed   By: Marnee Spring M.D.   On: 06/09/2020 05:42    Assessment and Plan:   1.  Non-STEMI: -She is currently pain-free after aspirin, heparin, IV nitro, and pain control medications -[facility] is here and she is being transported to [facility] to go to the Cath Lab. - Cardiac catheterization was discussed with the patient fully. The patient understands that risks include but are not limited to stroke (1 in 1000), death (1 in 1000), kidney failure [usually temporary] (1 in 500), bleeding (1 in 200), allergic reaction [possibly serious] (1 in 200).  The patient understands and is willing to proceed.   -Further evaluation and treatment will depend on the results. -We will not  start a beta-blocker at this time because of some overnight bradycardia. -Start high-dose statin, additional meds per MD -Screen for cardiac risk factors with a hemoglobin A1c and lipid profile  2.  Hypothyroidism -Check TSH, continue home medications   Active Problems:   NSTEMI (non-ST elevated myocardial infarction) [facility])     For questions or updates, please contact [facility] Please consult [email or web address] for contact info under Cardiology/STEMI.    SignedTheodore Demark, PA-C  06/09/2020 8:19 AM

## 2020-06-09 NOTE — Progress Notes (Signed)
CRITICAL VALUE ALERT  Critical Value:  Troponin  Date & Time Notied:  9/17  1530hrs  Provider Notified: [place], PA  Orders Received/Actions taken: Will continue to monitor and notify Cardiology if CP.

## 2020-06-09 NOTE — Progress Notes (Signed)
Dr. Kirke Corin at bedside talking w/patient. Walked to BR, urine sample obtained for pregnancy test and sent to lab.

## 2020-06-09 NOTE — ED Triage Notes (Signed)
Pt C/O chest pain that radiates to bilateral shoulders and back. Pt reports that pain started at 0230 yesterday morning.

## 2020-06-09 NOTE — Progress Notes (Signed)
Paged by Marlis Edelson RN for clarification on plavix load dosing. Per verbal report, cath lab administered 75 mg plavix following the procedure. This is not charted, but confirmed with the cath lab by [facility]. In consultation with Dr. Kirke Corin, I will reduce the loading dose to 525 mg plavix. Start 75 mg plavix tomorrow.   Roe Rutherford Odysseus Cada, PA-C 06/09/2020, 2:30 PM [phone]

## 2020-06-09 NOTE — ED Notes (Signed)
[facility] here to transport pt.   

## 2020-06-09 NOTE — Progress Notes (Signed)
Patient transferred from cath lab at 1331hrs.  Right radial site level 0, +2 radial pulse.  NTG gtt IV infusing at 64mcg/min, will wean off per order to d/c.  Patient denies CP/SOB/ dizziness at this time.  Given post cath instructions and oriented to unit.  Patient verbalized understanding.

## 2020-06-09 NOTE — Progress Notes (Signed)
Arrived from [facility] to Holding Area on NTG at and Heparin at 1000U infusing to left forearm

## 2020-06-10 DIAGNOSIS — E785 Hyperlipidemia, unspecified: Secondary | ICD-10-CM

## 2020-06-10 DIAGNOSIS — Z720 Tobacco use: Secondary | ICD-10-CM

## 2020-06-10 DIAGNOSIS — E039 Hypothyroidism, unspecified: Secondary | ICD-10-CM

## 2020-06-10 DIAGNOSIS — E118 Type 2 diabetes mellitus with unspecified complications: Secondary | ICD-10-CM

## 2020-06-10 DIAGNOSIS — I2583 Coronary atherosclerosis due to lipid rich plaque: Secondary | ICD-10-CM

## 2020-06-10 DIAGNOSIS — E7800 Pure hypercholesterolemia, unspecified: Secondary | ICD-10-CM

## 2020-06-10 DIAGNOSIS — I2510 Atherosclerotic heart disease of native coronary artery without angina pectoris: Secondary | ICD-10-CM

## 2020-06-10 LAB — BASIC METABOLIC PANEL
Anion gap: 6 (ref 5–15)
BUN: 8 mg/dL (ref 6–20)
CO2: 25 mmol/L (ref 22–32)
Calcium: 8.6 mg/dL — ABNORMAL LOW (ref 8.9–10.3)
Chloride: 104 mmol/L (ref 98–111)
Creatinine, Ser: 0.84 mg/dL (ref 0.44–1.00)
GFR calc Af Amer: >60 mL/min (ref >60)
GFR calc non Af Amer: >60 mL/min (ref >60)
Glucose, Bld: 125 mg/dL — ABNORMAL HIGH (ref 70–99)
Potassium: 4.4 mmol/L (ref 3.5–5.1)
Sodium: 135 mmol/L (ref 135–145)

## 2020-06-10 LAB — CBC
HCT: 37.6 % (ref 36.0–46.0)
Hemoglobin: 11.8 g/dL — ABNORMAL LOW (ref 12.0–15.0)
MCH: 31.8 pg (ref 26.0–34.0)
MCHC: 31.4 g/dL (ref 30.0–36.0)
MCV: 101.3 fL — ABNORMAL HIGH (ref 80.0–100.0)
Platelets: 296 10*3/uL (ref 150–400)
RBC: 3.71 MIL/uL — ABNORMAL LOW (ref 3.87–5.11)
RDW: 13.5 % (ref 11.5–15.5)
WBC: 9 10*3/uL (ref 4.0–10.5)
nRBC: 0 % (ref 0.0–0.2)

## 2020-06-10 MED ORDER — ATORVASTATIN CALCIUM 80 MG PO TABS: 80.0000 mg | ORAL_TABLET | Freq: Every day | ORAL | 2 refills | Status: DC

## 2020-06-10 MED ORDER — CARVEDILOL 3.125 MG PO TABS: 3.1250 mg | ORAL_TABLET | Freq: Two times a day (BID) | ORAL | 2 refills | Status: DC

## 2020-06-10 MED ORDER — CLOPIDOGREL BISULFATE 75 MG PO TABS: 75.0000 mg | ORAL_TABLET | Freq: Every day | ORAL | 11 refills | Status: DC

## 2020-06-10 MED ORDER — NITROGLYCERIN 0.4 MG SL SUBL: 0.4000 mg | SUBLINGUAL_TABLET | SUBLINGUAL | 2 refills | Status: DC | PRN

## 2020-06-10 MED ORDER — ASPIRIN 81 MG PO CHEW: 81.0000 mg | CHEWABLE_TABLET | Freq: Every day | ORAL | Status: DC

## 2020-06-10 NOTE — Progress Notes (Addendum)
CARDIAC REHAB PHASE I   PRE:  Rate/Rhythm: 64 SR  BP:  Supine: 133/71  Sitting:   Standing:    SaO2: 100 RA  MODE:  Ambulation: 470 ft   POST:  Rate/Rhythm: 93 SR  BP:  Supine:   Sitting: 140/84  Standing:    SaO2: 100 RA [id number] Pt tolerated ambulation well without c/o of SOB or co. VS stable. Completed MI education with pt. We discussed MI, activity restrictions, angina, proper use of sL NTG, call 911, risk factors, modifications, exercise, her AIC, heart healthy and diabetic diet and Outpt. CRP. She voices understanding. Will send referral to Outpt. CRP in [place]. We discussed smoking cessation and I gave her information for this. I encouraged her to call 1800 quit now line for coaching.She is very motivated to quit.  Melina Copa RN 06/10/2020 9:18 AM

## 2020-06-10 NOTE — Progress Notes (Signed)
Pt's stable, DC home via wheelchair 

## 2020-06-10 NOTE — Progress Notes (Signed)
Progress Note  Patient Name: Linda Lin Date of Encounter: 06/10/2020  Primary Cardiologist: Dina Rich, MD   Subjective   Denies any chest pain or pressure.  Cath yesterday showed 40% D1, 70% D2, 50% pRCA, 99% RPDA.  Inpatient Medications    Scheduled Meds: . aspirin  81 mg Oral Daily  . atorvastatin  80 mg Oral Daily  . carvedilol  3.125 mg Oral BID WC  . clopidogrel  75 mg Oral Q breakfast  . enoxaparin (LOVENOX) injection  40 mg Subcutaneous Q24H  . levothyroxine  112 mcg Oral QAC breakfast  . sodium chloride flush  3 mL Intravenous Q12H  . sodium chloride flush  3 mL Intravenous Q12H   Continuous Infusions: . sodium chloride    . sodium chloride     PRN Meds: sodium chloride, sodium chloride, acetaminophen, albuterol, ALPRAZolam, nitroGLYCERIN, ondansetron (ZOFRAN) IV, sodium chloride flush, sodium chloride flush, zolpidem   Vital Signs    Vitals:   06/10/20 0410 06/10/20 0411 06/10/20 0625 06/10/20 0744  BP: 136/81   133/71  Pulse:  74  77  Resp:  20  18  Temp:  98.3 F (36.8 C)  98.2 F (36.8 C)  TempSrc:  Oral  Oral  SpO2:  100%  100%  Weight:   80.7 kg   Height:        Intake/Output Summary (Last 24 hours) at 06/10/2020 0904 Last data filed at 06/09/2020 1730 Gross per 24 hour  Intake 770 ml  Output 400 ml  Net 370 ml   Filed Weights   06/09/20 0607 06/10/20 0625  Weight: 79.4 kg 80.7 kg    Telemetry    NSR - Personally Reviewed  ECG    NSR with inferior T wave abnormality - Personally Reviewed  Physical Exam   GEN: No acute distress.   Neck: No JVD Cardiac: RRR, no murmurs, rubs, or gallops.  Respiratory: Clear to auscultation bilaterally. GI: Soft, nontender, non-distended  MS: No edema; No deformity. Right radial cath site clean and dry with no hematoma and 2+ radial artery pulse Neuro:  Nonfocal  Psych: Normal affect   Labs    Chemistry Recent Labs  Lab 06/09/20 0417 06/09/20 1427 06/10/20 0503  NA 135   --  135  K 4.1  --  4.4  CL 103  --  104  CO2 25  --  25  GLUCOSE 141*  --  125*  BUN 8  --  8  CREATININE 0.64 0.72 0.84  CALCIUM 10.0  --  8.6*  PROT  --  6.3*  --   ALBUMIN  --  3.1*  --   AST  --  69*  --   ALT  --  20  --   ALKPHOS  --  79  --   BILITOT  --  0.5  --   GFRNONAA >60 >60 >60  GFRAA >60 >60 >60  ANIONGAP 7  --  6     Hematology Recent Labs  Lab 06/09/20 0417 06/09/20 1427 06/10/20 0503  WBC 9.9 10.3 9.0  RBC 3.89 3.59* 3.71*  HGB 12.6 11.5* 11.8*  HCT 39.7 36.7 37.6  MCV 102.1* 102.2* 101.3*  MCH 32.4 32.0 31.8  MCHC 31.7 31.3 31.4  RDW 13.6 13.5 13.5  PLT 311 344 296    Cardiac EnzymesNo results for input(s): TROPONINI in the last 168 hours. No results for input(s): TROPIPOC in the last 168 hours.   BNPNo results for input(s): BNP, PROBNP  in the last 168 hours.   DDimer No results for input(s): DDIMER in the last 168 hours.   Radiology    DG Chest 2 View  Result Date: 06/09/2020 CLINICAL DATA:  Chest pain radiating into back and shoulder, asthma, tobacco abuse EXAM: CHEST - 2 VIEW COMPARISON:  02/10/2019 FINDINGS: The heart size and mediastinal contours are within normal limits. Both lungs are clear. The visualized skeletal structures are unremarkable. IMPRESSION: No active cardiopulmonary disease. Electronically Signed   By: Sharlet Salina M.D.   On: 06/09/2020 02:49   CARDIAC CATHETERIZATION  Result Date: 06/09/2020  The left ventricular systolic function is normal.  LV end diastolic pressure is normal.  The left ventricular ejection fraction is 55-65% by visual estimate.  1st Diag-1 lesion is 40% stenosed.  1st Diag-2 lesion is 70% stenosed.  Prox RCA lesion is 50% stenosed.  RPDA lesion is 99% stenosed.  1.  Severe one-vessel coronary artery disease with thrombotic subtotal occlusion of ostial right PDA with TIMI I flow supplying a relatively small area.  This is the likely culprit for myocardial infarction.  There is moderate stenosis  in the proximal right coronary artery and there is also 70% stenosis in the distal first diagonal. 2.  Normal LV systolic function with mild inferior wall hypokinesis.  High normal left ventricular end-diastolic pressure at 12 mmHg. Recommendations: Given that the patient is chest pain-free and onset of myocardial infarction is more than 48 hours, risks of PCI on right PDA outweigh the benefits and the overall supplied territory is relatively small.  Recommend medical therapy. Recommend dual antiplatelet therapy with aspirin and clopidogrel for at least 1 year. I added high-dose atorvastatin. Obtain an echocardiogram. Smoking cessation and cardiac rehab are recommended. Likely discharge home tomorrow.   ECHOCARDIOGRAM COMPLETE  Result Date: 06/09/2020    ECHOCARDIOGRAM REPORT   Patient Name:   Linda Lin Date of Exam: 06/09/2020 Medical Rec #:  [id number]          Height:       62.0 in Accession #:    [id number]         Weight:       175.0 lb Date of Birth:  [birth year]          BSA:          1.806 m Patient Age:    49 years           BP:           124/86 mmHg Patient Gender: F                  HR:           62 bpm. Exam Location:  Inpatient Procedure: 2D Echo, Color Doppler and Cardiac Doppler Indications:    NSTEMI i21.4  History:        Patient has prior history of Echocardiogram examinations, most                 recent 12/20/2015. NSTEMI.  Sonographer:    Irving Burton Senior RDCS Referring Phys: [id number] RHONDA G BARRETT IMPRESSIONS  1. Left ventricular ejection fraction, by estimation, is 55 to 60%. The left ventricle has normal function. The left ventricle has no regional wall motion abnormalities. Left ventricular diastolic parameters are consistent with Grade I diastolic dysfunction (impaired relaxation).  2. Right ventricular systolic function is normal. The right ventricular size is normal. Tricuspid regurgitation signal is inadequate for assessing PA pressure.  3. The mitral valve  is grossly normal. No  evidence of mitral valve regurgitation. No evidence of mitral stenosis.  4. The aortic valve is grossly normal. Aortic valve regurgitation is not visualized. No aortic stenosis is present.  5. The inferior vena cava is normal in size with greater than 50% respiratory variability, suggesting right atrial pressure of 3 mmHg. Comparison(s): No significant change from prior study. FINDINGS  Left Ventricle: Left ventricular ejection fraction, by estimation, is 55 to 60%. The left ventricle has normal function. The left ventricle has no regional wall motion abnormalities. The left ventricular internal cavity size was normal in size. There is  no left ventricular hypertrophy. Left ventricular diastolic parameters are consistent with Grade I diastolic dysfunction (impaired relaxation). Right Ventricle: The right ventricular size is normal. No increase in right ventricular wall thickness. Right ventricular systolic function is normal. Tricuspid regurgitation signal is inadequate for assessing PA pressure. Left Atrium: Left atrial size was normal in size. Right Atrium: Right atrial size was normal in size. Pericardium: Trivial pericardial effusion is present. Mitral Valve: The mitral valve is grossly normal. There is mild calcification of the posterior mitral valve leaflet(s). No evidence of mitral valve regurgitation. No evidence of mitral valve stenosis. Tricuspid Valve: The tricuspid valve is grossly normal. Tricuspid valve regurgitation is trivial. No evidence of tricuspid stenosis. Aortic Valve: The aortic valve is grossly normal. Aortic valve regurgitation is not visualized. No aortic stenosis is present. Pulmonic Valve: The pulmonic valve was grossly normal. Pulmonic valve regurgitation is not visualized. No evidence of pulmonic stenosis. Aorta: The aortic root and ascending aorta are structurally normal, with no evidence of dilitation. Venous: The inferior vena cava is normal in size with greater than 50% respiratory  variability, suggesting right atrial pressure of 3 mmHg. IAS/Shunts: The atrial septum is grossly normal.  LEFT VENTRICLE PLAX 2D LVIDd:         4.80 cm  Diastology LVIDs:         3.40 cm  LV e' medial:    6.31 cm/s LV PW:         1.10 cm  LV E/e' medial:  11.5 LV IVS:        1.10 cm  LV e' lateral:   6.31 cm/s LVOT diam:     1.90 cm  LV E/e' lateral: 11.5 LV SV:         52 LV SV Index:   29 LVOT Area:     2.84 cm  RIGHT VENTRICLE RV S prime:     7.72 cm/s TAPSE (M-mode): 1.8 cm LEFT ATRIUM             Index       RIGHT ATRIUM           Index LA diam:        3.40 cm 1.88 cm/m  RA Area:     12.70 cm LA Vol (A2C):   44.8 ml 24.80 ml/m RA Volume:   32.80 ml  18.16 ml/m LA Vol (A4C):   30.3 ml 16.78 ml/m LA Biplane Vol: 36.9 ml 20.43 ml/m  AORTIC VALVE LVOT Vmax:   93.80 cm/s LVOT Vmean:  68.300 cm/s LVOT VTI:    0.182 m  AORTA Ao Root diam: 3.10 cm Ao Asc diam:  3.00 cm MITRAL VALVE MV Area (PHT): 3.65 cm    SHUNTS MV Decel Time: 208 msec    Systemic VTI:  0.18 m MV E velocity: 72.40 cm/s  Systemic Diam: 1.90 cm MV A velocity: 89.30 cm/s  MV E/A ratio:  0.81 Lennie Odor MD Electronically signed by Lennie Odor MD Signature Date/Time: 06/09/2020/3:57:01 PM    Final    CT Angio Chest/Abd/Pel for Dissection W and/or W/WO  Result Date: 06/09/2020 CLINICAL DATA:  Chest pain that radiates to back and shoulders. EXAM: CT ANGIOGRAPHY CHEST, ABDOMEN AND PELVIS TECHNIQUE: Non-contrast CT of the chest was initially obtained. Multidetector CT imaging through the chest, abdomen and pelvis was performed using the standard protocol during bolus administration of intravenous contrast. Multiplanar reconstructed images and MIPs were obtained and reviewed to evaluate the vascular anatomy. CONTRAST:  OMNIPAQUE IOHEXOL 350 MG/ML SOLN COMPARISON:  Abdomen and pelvis CT 06/08/2018 FINDINGS: CTA CHEST FINDINGS Cardiovascular: No intramural hematoma on noncontrast phase. Normal heart size. Trace low-density anterior  pericardial fluid. No aortic aneurysm or dissection. The great vessels are enhancing. No pulmonary artery filling defects. Mediastinum/Nodes: Negative for adenopathy or mass. Lungs/Pleura: Generalized airway thickening. There is no edema, consolidation, effusion, or pneumothorax. Musculoskeletal: No acute or aggressive finding Review of the MIP images confirms the above findings. CTA ABDOMEN AND PELVIS FINDINGS VASCULAR Aorta: No aneurysm or dissection. Mild distal aortic atheromatous wall thickening. Celiac: Patent without evidence of aneurysm, dissection, vasculitis or significant stenosis. SMA: Patent without evidence of aneurysm, dissection, vasculitis or significant stenosis. Renals: Solitary bilateral renal arteries with are smooth and widely patent. IMA: Patent Inflow: Mild atheromatous plaque.  No dissection or aneurysm. Veins: Unremarkable in the arterial phase. Review of the MIP images confirms the above findings. NON-VASCULAR Hepatobiliary: No focal liver abnormality.No evidence of biliary obstruction or stone. Pancreas: Unremarkable. Spleen: Granulomatous calcifications. Adrenals/Urinary Tract: Negative adrenals. No hydronephrosis or stone. Unremarkable bladder. Stomach/Bowel:  No obstruction. No appendicitis. Lymphatic: No acute vascular abnormality.  No mass or adenopathy. Reproductive:No pathologic findings. Other: No ascites or pneumoperitoneum.  Fatty umbilical hernia. Musculoskeletal: No acute abnormalities. Review of the MIP images confirms the above findings. IMPRESSION: 1. Negative for acute aortic syndrome. 2. Mild atherosclerotic calcification of the aorta. No aortic aneurysm. 3. Generalized airway thickening, likely smoking-related. Electronically Signed   By: Marnee Spring M.D.   On: 06/09/2020 05:42    Cardiac Studies   Cardiac Cath 06/09/2020 Conclusion    The left ventricular systolic function is normal.  LV end diastolic pressure is normal.  The left ventricular ejection  fraction is 55-65% by visual estimate.  1st Diag-1 lesion is 40% stenosed.  1st Diag-2 lesion is 70% stenosed.  Prox RCA lesion is 50% stenosed.  RPDA lesion is 99% stenosed.   1.  Severe one-vessel coronary artery disease with thrombotic subtotal occlusion of ostial right PDA with TIMI I flow supplying a relatively small area.  This is the likely culprit for myocardial infarction.  There is moderate stenosis in the proximal right coronary artery and there is also 70% stenosis in the distal first diagonal. 2.  Normal LV systolic function with mild inferior wall hypokinesis.  High normal left ventricular end-diastolic pressure at 12 mmHg.  Recommendations: Given that the patient is chest pain-free and onset of myocardial infarction is more than 48 hours, risks of PCI on right PDA outweigh the benefits and the overall supplied territory is relatively small.  Recommend medical therapy. Recommend dual antiplatelet therapy with aspirin and clopidogrel for at least 1 year. I added high-dose atorvastatin. Obtain an echocardiogram. Smoking cessation and cardiac rehab are recommended. Likely discharge home tomorrow.  2D echo 06/09/2020 IMPRESSIONS    1. Left ventricular ejection fraction, by estimation, is 55 to 60%. The  left ventricle has normal function. The left ventricle has no regional  wall motion abnormalities. Left ventricular diastolic parameters are  consistent with Grade I diastolic  dysfunction (impaired relaxation).  2. Right ventricular systolic function is normal. The right ventricular  size is normal. Tricuspid regurgitation signal is inadequate for assessing  PA pressure.  3. The mitral valve is grossly normal. No evidence of mitral valve  regurgitation. No evidence of mitral stenosis.  4. The aortic valve is grossly normal. Aortic valve regurgitation is not  visualized. No aortic stenosis is present.  5. The inferior vena cava is normal in size with greater than  50%  respiratory variability, suggesting right atrial pressure of 3 mmHg.    Patient Profile     49 y.o. female with a history of cellulitis in her right lower extremity, hypothyroidism, tobacco use, family history of coronary artery disease in her grandmother.  She has no known history of hypertension, hyperlipidemia, or diabetes.  Presented with CP and ruled in for NSTEMI  Assessment & Plan    1.  NSTEMI/CAD -hsTrop peaked at 25,381 and trending downward -cath yesterday showed 40% and 70% tandem lesion in the D1, 50% pRCA and 99% RPDA (thrombotic subtotal occlusion) -medical management recommended given onset more than 48 hours prior to presentation and resolution of CP and risks felt to outweigh benefits of PCI of PDA -she has not CP at this time -2D echo with normal LVF -started on ASA  daily, Plavix  daily, BB and high dose statin daily  2.  HLD -LDL was 125 -started on high dose statin atorvastatin  daily -will need FLP and ALT in 6 weeks  3.  Tobacco abuse -tobacco cessation counseling provided  Patient is stable from a cardiac standpoint for discharge home.  Will have TOC followup in 7-10 days.  For questions or updates, please contact [facility] Please consult [email or web address] for contact info under Cardiology/STEMI.      Signed, Armanda Magic, MD  06/10/2020, 9:04 AM

## 2020-06-10 NOTE — Progress Notes (Signed)
Patient was provided discharge instructions and verbalized understanding of instructions by teaching back. Patient was stabled at discharge and was transported via wheelchair to her sister. Pt. D/c home.

## 2020-06-10 NOTE — Discharge Summary (Addendum)
Discharge Summary    Patient ID: Linda Lin MRN: [id number]; DOB: 1971/03/06  Admit date: 06/09/2020 Discharge date: 06/10/2020  Primary Care Provider: Patient, No Pcp Per  Primary Cardiologist: Dina Rich, MD  Primary Electrophysiologist:  None   Discharge Diagnoses    Principal Problem:   NSTEMI (non-ST elevated myocardial infarction) [facility]) Active Problems:   Tobacco abuse   CAD (coronary artery disease)   Hyperlipidemia   Type 2 diabetes mellitus with complication, without long-term current use of insulin (HCC)   Hypothyroidism    Diagnostic Studies/Procedures    Left Heart Catheterization 06/09/2020:  The left ventricular systolic function is normal.  LV end diastolic pressure is normal.  The left ventricular ejection fraction is 55-65% by visual estimate.  1st Diag-1 lesion is 40% stenosed.  1st Diag-2 lesion is 70% stenosed.  Prox RCA lesion is 50% stenosed.  RPDA lesion is 99% stenosed.   1.  Severe one-vessel coronary artery disease with thrombotic subtotal occlusion of ostial right PDA with TIMI I flow supplying a relatively small area.  This is the likely culprit for myocardial infarction.  There is moderate stenosis in the proximal right coronary artery and there is also 70% stenosis in the distal first diagonal. 2.  Normal LV systolic function with mild inferior wall hypokinesis.  High normal left ventricular end-diastolic pressure at 12 mmHg.  Recommendations: Given that the patient is chest pain-free and onset of myocardial infarction is more than 48 hours, risks of PCI on right PDA outweigh the benefits and the overall supplied territory is relatively small.  Recommend medical therapy. Recommend dual antiplatelet therapy with aspirin and clopidogrel for at least 1 year. I added high-dose atorvastatin. Obtain an echocardiogram. Smoking cessation and cardiac rehab are recommended. Likely discharge home tomorrow.  Diagnostic Dominance:  Right  _____________  Echocardiogram 06/09/2020: 1. Left ventricular ejection fraction, by estimation, is 55 to 60%. The  left ventricle has normal function. The left ventricle has no regional  wall motion abnormalities. Left ventricular diastolic parameters are  consistent with Grade I diastolic  dysfunction (impaired relaxation).  2. Right ventricular systolic function is normal. The right ventricular  size is normal. Tricuspid regurgitation signal is inadequate for assessing  PA pressure.  3. The mitral valve is grossly normal. No evidence of mitral valve  regurgitation. No evidence of mitral stenosis.  4. The aortic valve is grossly normal. Aortic valve regurgitation is not  visualized. No aortic stenosis is present.  5. The inferior vena cava is normal in size with greater than 50%  respiratory variability, suggesting right atrial pressure of 3 mmHg.   Comparison(s): No significant change from prior study.    History of Present Illness     Linda Lin is a 49 year old female with a history of cellulitis in her right lower extremity, hypothyroidism, current tobacco use, and a family history of coronary artery disease in her grandmother. She has no known history of CAD, hypertension, hyperlipidemia, or diabetes. Patient presented to the [facility] ED in the early morning of 06/09/2020 for further evaluation of central chest pain that had been ongoing for about 24 hours. Pain radiated to her shoulders and back and she had some mild shortness of breath associated with this. She was found to have a NSTEMI with initial high-sensitivity troponin markedly elevated at 9,964. EKG showed no acute ischemic changes. She was given Aspirin 325 mg, IV Heparin, IV Nitro, Zofran, Morphine, and Fentanyl. She was then transferred to [facility] for  cardiac catheterization. By the time she arrived to [facility], pain had resolved.   Hospital Course     Consultants: None  NSTEMI  Patient  admitted on 06/09/2020 for NSTEMI as stated above. High-sensitivity troponin peaked at 25,381. Left heart catheterization showed severe one vessel CAD with 99% thrombotic subtotal occlusion of RPDA felt to be culprit lesion. Also noted to have moderate 70% stenosis of distal 1st Diag. Given that the patient was chest pain-free and onset of MI was more than 48 hours, risks of PCI on right PDA were felt to outweigh the benefits and the overall supplied territory is relatively small. Therefore, medical therapy was recommended. Echo showed LVEF of 55-60% with normal wall motion and grade 1 diastolic dysfunction. Patient tolerated procedure well. No recurrent chest pain. Plan is for dual antiplatelet therapy with Aspirin  and Plavix  daily for at least 1 year. Also started on Lipitor  daily and Coreg 3.125mg  twice daily.  Hyperlipidemia Lipid panel this admission: Total Cholesterol 192, Triglycerides 96, HDL 48, LDL 125. LDL goal <70 given CAD. Started on Lipitor  daily. Will need repeat lipid panel and LFTs in 6-8 weeks.   Type 2 Diabetes Mellitus - New Diagnosis Hemoglobin A1c 7.1 this admission. No medications started this admission. Will need follow-up with PCP.  Hypothyroidism TSH elevated at 8.370 but free T4 normal. May be secondary to acute MI. Continue current dose of Synthroid. Follow-up with PCP.  Tobacco Abuse Counseled on tobacco cessation.  Patient seen and examined by Dr. Mayford Knife and determined to be stable for discharge. Our office will call to scheduled TOC follow-up. Medications as below. Patient was asked to remain out of work until follow-up visit as she works in a factory - work note provided.    Did the patient have an acute coronary syndrome (MI, NSTEMI, STEMI, etc) this admission?:  Yes                               AHA/ACC Clinical Performance & Quality Measures: 1. Aspirin prescribed? - Yes 2. ADP Receptor Inhibitor (Plavix/Clopidogrel, Brilinta/Ticagrelor or  Effient/Prasugrel) prescribed (includes medically managed patients)? - Yes 3. Beta Blocker prescribed? - Yes 4. High Intensity Statin (Lipitor 40-80mg  or Crestor 20-40mg ) prescribed? - Yes 5. EF assessed during THIS hospitalization? - Yes 6. For EF <40%, was ACEI/ARB prescribed? - Not Applicable (EF >/= 40%) 7. For EF <40%, Aldosterone Antagonist (Spironolactone or Eplerenone) prescribed? - Not Applicable (EF >/= 40%) 8. Cardiac Rehab Phase II ordered (including medically managed patients)? - Yes   _____________  Discharge Vitals Blood pressure 133/71, pulse 77, temperature 98.2 F (36.8 C), temperature source Oral, resp. rate 18, height  (1.575 m), weight 80.7 kg, SpO2 100 %.  Filed Weights   06/09/20 0607 06/10/20 0625  Weight: 79.4 kg 80.7 kg    Labs & Radiologic Studies    CBC Recent Labs    06/09/20 1427 06/10/20 0503  WBC 10.3 9.0  HGB 11.5* 11.8*  HCT 36.7 37.6  MCV 102.2* 101.3*  PLT 344 296   Basic Metabolic Panel Recent Labs    09/81/19 0417 06/09/20 0417 06/09/20 1427 06/10/20 0503  NA 135  --   --  135  K 4.1  --   --  4.4  CL 103  --   --  104  CO2 25  --   --  25  GLUCOSE 141*  --   --  125*  BUN 8  --   --  8  CREATININE 0.64   < > 0.72 0.84  CALCIUM 10.0  --   --  8.6*   < > = values in this interval not displayed.   Liver Function Tests Recent Labs    06/09/20 1427  AST 69*  ALT 20  ALKPHOS 79  BILITOT 0.5  PROT 6.3*  ALBUMIN 3.1*   No results for input(s): LIPASE, AMYLASE in the last 72 hours. High Sensitivity Troponin:   Recent Labs  Lab 06/09/20 0417 06/09/20 0628 06/09/20 1427 06/09/20 1808  TROPONINIHS 9,964* 13,546* 25,381* 18,199*    BNP Invalid input(s): POCBNP D-Dimer No results for input(s): DDIMER in the last 72 hours. Hemoglobin A1C Recent Labs    06/09/20 1427  HGBA1C 7.1*   Fasting Lipid Panel Recent Labs    06/09/20 1427  CHOL 192  HDL 48  LDLCALC 125*  TRIG 96  CHOLHDL 4.0   Thyroid Function  Tests Recent Labs    06/09/20 1427  TSH 8.370*   _____________  DG Chest 2 View  Result Date: 06/09/2020 CLINICAL DATA:  Chest pain radiating into back and shoulder, asthma, tobacco abuse EXAM: CHEST - 2 VIEW COMPARISON:  02/10/2019 FINDINGS: The heart size and mediastinal contours are within normal limits. Both lungs are clear. The visualized skeletal structures are unremarkable. IMPRESSION: No active cardiopulmonary disease. Electronically Signed   By: Sharlet Salina M.D.   On: 06/09/2020 02:49   CARDIAC CATHETERIZATION  Result Date: 06/09/2020  The left ventricular systolic function is normal.  LV end diastolic pressure is normal.  The left ventricular ejection fraction is 55-65% by visual estimate.  1st Diag-1 lesion is 40% stenosed.  1st Diag-2 lesion is 70% stenosed.  Prox RCA lesion is 50% stenosed.  RPDA lesion is 99% stenosed.  1.  Severe one-vessel coronary artery disease with thrombotic subtotal occlusion of ostial right PDA with TIMI I flow supplying a relatively small area.  This is the likely culprit for myocardial infarction.  There is moderate stenosis in the proximal right coronary artery and there is also 70% stenosis in the distal first diagonal. 2.  Normal LV systolic function with mild inferior wall hypokinesis.  High normal left ventricular end-diastolic pressure at 12 mmHg. Recommendations: Given that the patient is chest pain-free and onset of myocardial infarction is more than 48 hours, risks of PCI on right PDA outweigh the benefits and the overall supplied territory is relatively small.  Recommend medical therapy. Recommend dual antiplatelet therapy with aspirin and clopidogrel for at least 1 year. I added high-dose atorvastatin. Obtain an echocardiogram. Smoking cessation and cardiac rehab are recommended. Likely discharge home tomorrow.   ECHOCARDIOGRAM COMPLETE  Result Date: 06/09/2020    ECHOCARDIOGRAM REPORT   Patient Name:   Linda Lin Date of Exam:  06/09/2020 Medical Rec #:  [id number]          Height:       62.0 in Accession #:    [id number]         Weight:       175.0 lb Date of Birth:  April 08, 1971          BSA:          1.806 m Patient Age:    49 years           BP:           124/86 mmHg Patient Gender: F  HR:           62 bpm. Exam Location:  Inpatient Procedure: 2D Echo, Color Doppler and Cardiac Doppler Indications:    NSTEMI i21.4  History:        Patient has prior history of Echocardiogram examinations, most                 recent 12/20/2015. NSTEMI.  Sonographer:    Irving Burton Senior RDCS Referring Phys: [id number] RHONDA G BARRETT IMPRESSIONS  1. Left ventricular ejection fraction, by estimation, is 55 to 60%. The left ventricle has normal function. The left ventricle has no regional wall motion abnormalities. Left ventricular diastolic parameters are consistent with Grade I diastolic dysfunction (impaired relaxation).  2. Right ventricular systolic function is normal. The right ventricular size is normal. Tricuspid regurgitation signal is inadequate for assessing PA pressure.  3. The mitral valve is grossly normal. No evidence of mitral valve regurgitation. No evidence of mitral stenosis.  4. The aortic valve is grossly normal. Aortic valve regurgitation is not visualized. No aortic stenosis is present.  5. The inferior vena cava is normal in size with greater than 50% respiratory variability, suggesting right atrial pressure of 3 mmHg. Comparison(s): No significant change from prior study. FINDINGS  Left Ventricle: Left ventricular ejection fraction, by estimation, is 55 to 60%. The left ventricle has normal function. The left ventricle has no regional wall motion abnormalities. The left ventricular internal cavity size was normal in size. There is  no left ventricular hypertrophy. Left ventricular diastolic parameters are consistent with Grade I diastolic dysfunction (impaired relaxation). Right Ventricle: The right ventricular size is normal. No  increase in right ventricular wall thickness. Right ventricular systolic function is normal. Tricuspid regurgitation signal is inadequate for assessing PA pressure. Left Atrium: Left atrial size was normal in size. Right Atrium: Right atrial size was normal in size. Pericardium: Trivial pericardial effusion is present. Mitral Valve: The mitral valve is grossly normal. There is mild calcification of the posterior mitral valve leaflet(s). No evidence of mitral valve regurgitation. No evidence of mitral valve stenosis. Tricuspid Valve: The tricuspid valve is grossly normal. Tricuspid valve regurgitation is trivial. No evidence of tricuspid stenosis. Aortic Valve: The aortic valve is grossly normal. Aortic valve regurgitation is not visualized. No aortic stenosis is present. Pulmonic Valve: The pulmonic valve was grossly normal. Pulmonic valve regurgitation is not visualized. No evidence of pulmonic stenosis. Aorta: The aortic root and ascending aorta are structurally normal, with no evidence of dilitation. Venous: The inferior vena cava is normal in size with greater than 50% respiratory variability, suggesting right atrial pressure of 3 mmHg. IAS/Shunts: The atrial septum is grossly normal.  LEFT VENTRICLE PLAX 2D LVIDd:         4.80 cm  Diastology LVIDs:         3.40 cm  LV e' medial:    6.31 cm/s LV PW:         1.10 cm  LV E/e' medial:  11.5 LV IVS:        1.10 cm  LV e' lateral:   6.31 cm/s LVOT diam:     1.90 cm  LV E/e' lateral: 11.5 LV SV:         52 LV SV Index:   29 LVOT Area:     2.84 cm  RIGHT VENTRICLE RV S prime:     7.72 cm/s TAPSE (M-mode): 1.8 cm LEFT ATRIUM  Index       RIGHT ATRIUM           Index LA diam:        3.40 cm 1.88 cm/m  RA Area:     12.70 cm LA Vol (A2C):   44.8 ml 24.80 ml/m RA Volume:   32.80 ml  18.16 ml/m LA Vol (A4C):   30.3 ml 16.78 ml/m LA Biplane Vol: 36.9 ml 20.43 ml/m  AORTIC VALVE LVOT Vmax:   93.80 cm/s LVOT Vmean:  68.300 cm/s LVOT VTI:    0.182 m  AORTA Ao  Root diam: 3.10 cm Ao Asc diam:  3.00 cm MITRAL VALVE MV Area (PHT): 3.65 cm    SHUNTS MV Decel Time: 208 msec    Systemic VTI:  0.18 m MV E velocity: 72.40 cm/s  Systemic Diam: 1.90 cm MV A velocity: 89.30 cm/s MV E/A ratio:  0.81 Lennie Odor MD Electronically signed by Lennie Odor MD Signature Date/Time: 06/09/2020/3:57:01 PM    Final    CT Angio Chest/Abd/Pel for Dissection W and/or W/WO  Result Date: 06/09/2020 CLINICAL DATA:  Chest pain that radiates to back and shoulders. EXAM: CT ANGIOGRAPHY CHEST, ABDOMEN AND PELVIS TECHNIQUE: Non-contrast CT of the chest was initially obtained. Multidetector CT imaging through the chest, abdomen and pelvis was performed using the standard protocol during bolus administration of intravenous contrast. Multiplanar reconstructed images and MIPs were obtained and reviewed to evaluate the vascular anatomy. CONTRAST:  OMNIPAQUE IOHEXOL 350 MG/ML SOLN COMPARISON:  Abdomen and pelvis CT 06/08/2018 FINDINGS: CTA CHEST FINDINGS Cardiovascular: No intramural hematoma on noncontrast phase. Normal heart size. Trace low-density anterior pericardial fluid. No aortic aneurysm or dissection. The great vessels are enhancing. No pulmonary artery filling defects. Mediastinum/Nodes: Negative for adenopathy or mass. Lungs/Pleura: Generalized airway thickening. There is no edema, consolidation, effusion, or pneumothorax. Musculoskeletal: No acute or aggressive finding Review of the MIP images confirms the above findings. CTA ABDOMEN AND PELVIS FINDINGS VASCULAR Aorta: No aneurysm or dissection. Mild distal aortic atheromatous wall thickening. Celiac: Patent without evidence of aneurysm, dissection, vasculitis or significant stenosis. SMA: Patent without evidence of aneurysm, dissection, vasculitis or significant stenosis. Renals: Solitary bilateral renal arteries with are smooth and widely patent. IMA: Patent Inflow: Mild atheromatous plaque.  No dissection or aneurysm. Veins:  Unremarkable in the arterial phase. Review of the MIP images confirms the above findings. NON-VASCULAR Hepatobiliary: No focal liver abnormality.No evidence of biliary obstruction or stone. Pancreas: Unremarkable. Spleen: Granulomatous calcifications. Adrenals/Urinary Tract: Negative adrenals. No hydronephrosis or stone. Unremarkable bladder. Stomach/Bowel:  No obstruction. No appendicitis. Lymphatic: No acute vascular abnormality.  No mass or adenopathy. Reproductive:No pathologic findings. Other: No ascites or pneumoperitoneum.  Fatty umbilical hernia. Musculoskeletal: No acute abnormalities. Review of the MIP images confirms the above findings. IMPRESSION: 1. Negative for acute aortic syndrome. 2. Mild atherosclerotic calcification of the aorta. No aortic aneurysm. 3. Generalized airway thickening, likely smoking-related. Electronically Signed   By: Marnee Spring M.D.   On: 06/09/2020 05:42   Disposition   Patient is being discharged home today in good condition.  Follow-up Plans & Appointments     Follow-up Information    Branch, Dorothe Pea, MD Follow up.   Specialty: Cardiology Why: Our office will call you to arrange follow-up visit. If you do not hear from [place] by mid-day Tuesday 06/13/2020, please call our office to schedule. Contact information: [address] [place] [address] [phone]        Primary Care Physician Follow up.  Why: Please call your primary care physician's office as soon as possible to scheduled a follow-up visit within the next 2 weeks.             Discharge Instructions    Amb Referral to Cardiac Rehabilitation   Complete by: As directed    Diagnosis: NSTEMI   After initial evaluation and assessments completed: Virtual Based Care may be provided alone or in conjunction with Phase 2 Cardiac Rehab based on patient barriers.: Yes   Amb Referral to Cardiac Rehabilitation   Complete by: As directed    Diagnosis: NSTEMI   After initial evaluation  and assessments completed: Virtual Based Care may be provided alone or in conjunction with Phase 2 Cardiac Rehab based on patient barriers.: Yes   Diet - low sodium heart healthy   Complete by: As directed    Increase activity slowly   Complete by: As directed       Discharge Medications   Allergies as of 06/10/2020      Reactions   Other Hives   shellfish   Cephalexin Rash   Doxycycline Rash      Medication List    STOP taking these medications   furosemide 20 MG tablet Commonly known as: LASIX   hydrOXYzine 25 MG tablet Commonly known as: ATARAX/VISTARIL   naproxen 500 MG tablet Commonly known as: NAPROSYN   triamcinolone cream 0.1 % Commonly known as: KENALOG     TAKE these medications   acetaminophen 500 MG tablet Commonly known as: TYLENOL Take 500 mg by mouth every 6 (six) hours as needed for mild pain or moderate pain.   aspirin 81 MG chewable tablet Chew 1 tablet (81 mg total) by mouth daily.   atorvastatin 80 MG tablet Commonly known as: LIPITOR Take 1 tablet (80 mg total) by mouth daily.   carvedilol 3.125 MG tablet Commonly known as: COREG Take 1 tablet (3.125 mg total) by mouth 2 (two) times daily with a meal.   clopidogrel 75 MG tablet Commonly known as: PLAVIX Take 1 tablet (75 mg total) by mouth daily with breakfast.   levothyroxine 112 MCG tablet Commonly known as: SYNTHROID Take 1 tablet (112 mcg total) by mouth daily before breakfast for 180 doses.   nitroGLYCERIN 0.4 MG SL tablet Commonly known as: NITROSTAT Place 1 tablet (0.4 mg total) under the tongue every 5 (five) minutes x 3 doses as needed for chest pain.          Outstanding Labs/Studies   Lipid panel and LFTs in 6-8 weeks.  Duration of Discharge Encounter   Greater than 30 minutes including physician time.  Signed, Corrin Parker, PA-C 06/10/2020, 9:51 AM

## 2020-06-12 ENCOUNTER — Telehealth: Payer: Self-pay | Admitting: *Deleted

## 2020-06-12 NOTE — Telephone Encounter (Signed)
Patient contacted regarding discharge from [facility] on Saturday, 06/10/2020.  Patient understands to follow up with provider Rennis Harding NP on Friday, 06/23/2020 at 1:30 pm at [facility]. Patient understands her discharge instructions. Patient understands her medications and regimen. Patient understands to bring all medications to this visit

## 2020-06-22 NOTE — Progress Notes (Signed)
Cardiology Office Note  Date: 06/23/2020   ID: Linda Lin, DOB 1971/03/18, MRN [id number]  PCP:  Patient, No Pcp Per  Cardiologist:  Dina Rich, MD Electrophysiologist:  None   Chief Complaint: Hospital follow-up  History of Present Illness: Linda Lin is a 48 y.o. female with a history of NSTEMI, CAD, HLD, DM2, tobacco abuse, hypothyroidism.   Presented to [facility] emergency room 06/09/2020 for complaints of central chest pain x24 hours.  Radiated to her shoulders and back.  Had mild shortness of breath.  Diagnosis was NSTEMI with initial  troponin elevated at 9964.  EKG with no acute ischemic changes.  Given aspirin 325, IV heparin, IV nitro, Zofran, morphine, fentanyl.  Transferred to [facility] for cardiac catheterization.  Chest pain had resolved by arrival to [facility].  LHC 99% thrombotic subtotal occlusion of RPDA felt to be culprit lesion.  Moderate 70% stenosis distal first diagonal.  Medical therapy was recommended.  Echo with EF of 55-60 with normal wall motion, G1 DD.  Started DAPT therapy with aspirin and Plavix for 1 year.  Lipitor 80 mg started.  Lipid panel: TC 192, TG 96, HDL 48, LDL 125.  Follow-up FLP and LFTs in 6 to 8 weeks.  Started Coreg 3.125 mg p.o. twice daily.  Hgb A1c was 7.1%.  Recommended follow-up PCP.  TSH elevated 8.370.  Free T4 normal.  Continue Synthroid and follow-up with PCP.  Counseled on tobacco cessation.  He is status post hospital admission for NSTEMI.  Complains of some intermittent chest pain associated with and without activity.  Denies any nitroglycerin use.  States she has some mild dyspnea on exertion with fatigue.  Denies any palpitations or arrhythmias.  States she feels a little off balance and has some fatigue.  Denies any CVA or TIA-like symptoms, palpitations or arrhythmias, PND, orthopnea, claudication-like symptoms, DVT or PE-like symptoms.  She has wounds on her hands and fingers which she describes as  cellulitis.  States she has had cellulitis on both lower extremities in the past.  Denies any claudication-like symptoms, DVT or PE-like symptoms or lower extremity edema.  Her right radial access site status post cardiac catheterization appears well-healed without redness or infection with 2+ pulses.  We spoke about diabetes control, cholesterol management and smoking cessation.  Patient understands the importance of lifestyle modifications.  Past Medical History:  Diagnosis Date  . Asthma   . Cellulitis   . Scabies   . Thyroid disorder     Past Surgical History:  Procedure Laterality Date  . LEFT HEART CATH AND CORONARY ANGIOGRAPHY N/A 06/09/2020   Procedure: LEFT HEART CATH AND CORONARY ANGIOGRAPHY;  Surgeon: Iran Ouch, MD;  Location: [facility] LAB;  Service: Cardiovascular;  Laterality: N/A;    Current Outpatient Medications  Medication Sig Dispense Refill  . acetaminophen (TYLENOL) 500 MG tablet Take 500 mg by mouth every 6 (six) hours as needed for mild pain or moderate pain.     Marland Kitchen albuterol (PROAIR HFA) 108 (90 Base) MCG/ACT inhaler Inhale 2 puffs into the lungs every 6 (six) hours as needed for wheezing or shortness of breath.    Marland Kitchen aspirin 81 MG chewable tablet Chew 1 tablet (81 mg total) by mouth daily.    Marland Kitchen atorvastatin (LIPITOR) 80 MG tablet Take 1 tablet (80 mg total) by mouth daily. 30 tablet 2  . carvedilol (COREG) 3.125 MG tablet Take 1 tablet (3.125 mg total) by mouth 2 (two) times daily  with a meal. 60 tablet 2  . clopidogrel (PLAVIX) 75 MG tablet Take 1 tablet (75 mg total) by mouth daily with breakfast. 30 tablet 11  . levothyroxine (SYNTHROID) 112 MCG tablet Take 1 tablet (112 mcg total) by mouth daily before breakfast for 180 doses. 90 tablet 1  . nitroGLYCERIN (NITROSTAT) 0.4 MG SL tablet Place 1 tablet (0.4 mg total) under the tongue every 5 (five) minutes x 3 doses as needed for chest pain. 25 tablet 2   No current facility-administered medications for  this visit.   Allergies:  Other, Cephalexin, and Doxycycline   Social History: The patient  reports that she has been smoking cigarettes. She started smoking about 32 years ago. She has a 5.00 pack-year smoking history. She has never used smokeless tobacco. She reports current alcohol use. She reports previous drug use. Drug: Marijuana.   Family History: The patient's family history includes Asthma in her mother; CAD in her maternal grandmother; Diabetes in her father; Diabetes Mellitus II in her brother; Hypertension in her brother and sister; Kidney failure in her father.   ROS:  Please see the history of present illness. Otherwise, complete review of systems is positive for none.  All other systems are reviewed and negative.   Physical Exam: VS:  BP 128/80   Pulse 68   Ht 5\' 2"  (1.575 m)   Wt 178 lb 12.8 oz (81.1 kg)   SpO2 98%   BMI 32.70 kg/m , BMI Body mass index is 32.7 kg/m.  Wt Readings from Last 3 Encounters:  06/23/20 178 lb 12.8 oz (81.1 kg)  06/10/20 178 lb (80.7 kg)  06/05/20 175 lb (79.4 kg)    General: Patient appears comfortable at rest. Neck: Supple, no elevated JVP or carotid bruits, no thyromegaly. Lungs: Clear to auscultation, nonlabored breathing at rest. Cardiac: Regular rate and rhythm, no S3 or significant systolic murmur, no pericardial rub. Extremities: No pitting edema, distal pulses 2+. Skin: Warm and dry.  Right radial access site clean and dry with 2+ pulses Musculoskeletal: No kyphosis. Neuropsychiatric: Alert and oriented x3, affect grossly appropriate.  ECG:  EKG June 10, 2020: Normal sinus rhythm, ST and T wave abnormality, consider inferior ischemia  Recent Labwork: 06/09/2020: ALT 20; AST 69; TSH 8.370 06/10/2020: BUN 8; Creatinine, Ser 0.84; Hemoglobin 11.8; Platelets 296; Potassium 4.4; Sodium 135     Component Value Date/Time   CHOL 192 06/09/2020 1427   TRIG 96 06/09/2020 1427   HDL 48 06/09/2020 1427   CHOLHDL 4.0 06/09/2020  1427   VLDL 19 06/09/2020 1427   LDLCALC 125 (H) 06/09/2020 1427    Other Studies Reviewed Today: Left Heart Catheterization 06/09/2020:  The left ventricular systolic function is normal.  LV end diastolic pressure is normal.  The left ventricular ejection fraction is 55-65% by visual estimate.  1st Diag-1 lesion is 40% stenosed.  1st Diag-2 lesion is 70% stenosed.  Prox RCA lesion is 50% stenosed.  RPDA lesion is 99% stenosed.  1. Severe one-vessel coronary artery disease with thrombotic subtotal occlusion of ostial right PDA with TIMI I flow supplying a relatively small area. This is the likely culprit for myocardial infarction. There is moderate stenosis in the proximal right coronary artery and there is also 70% stenosis in the distal first diagonal. 2. Normal LV systolic function with mild inferior wall hypokinesis. High normal left ventricular end-diastolic pressure at 12 mmHg.  Recommendations: Given that the patient is chest pain-free and onset of myocardial infarction is more than  48 hours, risks of PCI on right PDA outweigh the benefits and the overall supplied territory is relatively small. Recommend medical therapy. Recommend dual antiplatelet therapy with aspirin and clopidogrel for at least 1 year. I added high-dose atorvastatin. Obtain an echocardiogram. Smoking cessation and cardiac rehab are recommended. Likely discharge home tomorrow.  Diagnostic Dominance: Right  _____________  Echocardiogram 06/09/2020: 1. Left ventricular ejection fraction, by estimation, is 55 to 60%. The  left ventricle has normal function. The left ventricle has no regional  wall motion abnormalities. Left ventricular diastolic parameters are  consistent with Grade I diastolic  dysfunction (impaired relaxation).  2. Right ventricular systolic function is normal. The right ventricular  size is normal. Tricuspid regurgitation signal is inadequate for assessing  PA  pressure.  3. The mitral valve is grossly normal. No evidence of mitral valve  regurgitation. No evidence of mitral stenosis.  4. The aortic valve is grossly normal. Aortic valve regurgitation is not  visualized. No aortic stenosis is present.  5. The inferior vena cava is normal in size with greater than 50%  respiratory variability, suggesting right atrial pressure of 3 mmHg.   Comparison(s): No significant change from prior study  Assessment and Plan:  1. NSTEMI (non-ST elevated myocardial infarction) (HCC)   2. Mixed hyperlipidemia   3. Type 2 diabetes mellitus with complication, without long-term current use of insulin (HCC)   4. Hypothyroidism, unspecified type   5. Tobacco abuse    1. NSTEMI (non-ST elevated myocardial infarction) (HCC) Recent NSTEMI with cardiac catheterization showing 99% RPDA lesion.  First diagonal #1 lesion is 40% stenosis, first diagonal #2 lesion 70% stenosis, proximal RCA lesion 50% stenosis.  Subtotal occlusion of ostial right PDA with TIMI I flow supplying a relatively small area.  Given the fact patient was chest chest pain-free and onset of MI was more than 48 hours risk of PCI right PDA was thought to outweigh the benefits.  Medical therapy recommended with DAPT with aspirin and Plavix for least 1 year.  High-dose atorvastatin was started., suspected culprit lesion.  Patient reports minor chest pain with and without activity.  No nitroglycerin use.  Advised the patient on how to take her nitroglycerin.  She verbalizes understanding.  Continue aspirin 81 mg, Plavix 75 mg, sublingual nitroglycerin as needed.  Continue carvedilol 3.125 mg p.o. twice daily.  2. Mixed hyperlipidemia Continue atorvastatin 80 mg p.o. daily.  Get FLP and LFTs in 6 to 8 weeks.  3. Type 2 diabetes mellitus with complication, without long-term current use of insulin (HCC) Follow-up with PCP for management of diabetes.  Recent hemoglobin A1c was 7.1%.  This is a new diagnosis for  the patient.  4. Hypothyroidism, unspecified type Patient's TSH was elevated during recent hospital admission at 8.5.  Currently taking levothyroxine 112 mcg daily.  5. Tobacco abuse Highly advised patient to stop smoking.  She has wounds on both of her hands and fingers which she describes as cellulitis.  She states she has had these wounds on her legs also.  She states no one has been able to give her a clear diagnosis.  Question possible Buerger's disease    Medication Adjustments/Labs and Tests Ordered: Current medicines are reviewed at length with the patient today.  Concerns regarding medicines are outlined above.   Disposition: Follow-up with Dr. Wyline Mood or APP 3 months.  Signed, Rennis Harding, NP 06/23/2020 1:24 PM    [facility] at [facility] [address] [place], [place], [place] [address] Phone: [phone]; Fax: [phone]

## 2020-06-23 ENCOUNTER — Encounter: Payer: Self-pay | Admitting: *Deleted

## 2020-06-23 ENCOUNTER — Encounter: Payer: Self-pay | Admitting: Family Medicine

## 2020-06-23 ENCOUNTER — Telehealth: Payer: Self-pay | Admitting: Family Medicine

## 2020-06-23 ENCOUNTER — Ambulatory Visit (INDEPENDENT_AMBULATORY_CARE_PROVIDER_SITE_OTHER): Payer: Self-pay | Admitting: Family Medicine

## 2020-06-23 VITALS — BP 128/80 | HR 68 | Ht 62.0 in | Wt 178.8 lb

## 2020-06-23 DIAGNOSIS — E039 Hypothyroidism, unspecified: Secondary | ICD-10-CM

## 2020-06-23 DIAGNOSIS — E118 Type 2 diabetes mellitus with unspecified complications: Secondary | ICD-10-CM

## 2020-06-23 DIAGNOSIS — I214 Non-ST elevation (NSTEMI) myocardial infarction: Secondary | ICD-10-CM

## 2020-06-23 DIAGNOSIS — Z720 Tobacco use: Secondary | ICD-10-CM

## 2020-06-23 DIAGNOSIS — Z79899 Other long term (current) drug therapy: Secondary | ICD-10-CM

## 2020-06-23 DIAGNOSIS — E782 Mixed hyperlipidemia: Secondary | ICD-10-CM

## 2020-06-23 NOTE — Telephone Encounter (Signed)
Give her a note to return to work in 2 weeks.  Thanks

## 2020-06-23 NOTE — Telephone Encounter (Signed)
Message fwd to provider.

## 2020-06-23 NOTE — Patient Instructions (Signed)
Medication Instructions:   Your physician recommends that you continue on your current medications as directed. Please refer to the Current Medication list given to you today.  Labwork: Your physician recommends that you return for a FASTING lipid/liver profile in 6 weeks. Please do not eat or drink for at least 8 hours when you have this done. You may take your medications that morning with a sip of water.  This may be done at [facility] or [facility] ([address] [place]) Monday-Friday from 8:00 am - 4:00 pm. No appointment is needed.  Testing/Procedures:  None  Follow-Up:  Your physician recommends that you schedule a follow-up appointment in: 3 months.  Any Other Special Instructions Will Be Listed Below (If Applicable).  If you need a refill on your cardiac medications before your next appointment, please call your pharmacy.

## 2020-06-23 NOTE — Telephone Encounter (Signed)
Patient notified and verbalized understanding.  She will pick up next week.

## 2020-06-23 NOTE — Telephone Encounter (Signed)
Patient called stating that she forgot to ask if she is suppose to go back to work.

## 2020-07-06 ENCOUNTER — Ambulatory Visit: Payer: Self-pay | Admitting: Nurse Practitioner

## 2020-09-07 ENCOUNTER — Telehealth: Payer: Self-pay | Admitting: *Deleted

## 2020-09-07 NOTE — Telephone Encounter (Signed)
09/07/2020 called and said she went to [facility] and couldn't afford to have lab work done-she doesn't have insurance-advised to address this with Dr. Wyline Mood at her up coming visit in January 2022. Verbalized understanding

## 2020-09-07 NOTE — Telephone Encounter (Signed)
-----   Message from Eustace Moore, RN sent at 06/23/2020  5:09 PM EDT ----- Regarding: FOLLOW UP/FLP & LFT'S IN 6 WEEKS/QUINN GOING TO [facility]

## 2020-09-28 ENCOUNTER — Ambulatory Visit (INDEPENDENT_AMBULATORY_CARE_PROVIDER_SITE_OTHER): Payer: Self-pay | Admitting: Cardiology

## 2020-09-28 ENCOUNTER — Encounter: Payer: Self-pay | Admitting: Cardiology

## 2020-09-28 VITALS — BP 140/80 | HR 91 | Resp 16 | Ht 63.0 in | Wt 179.4 lb

## 2020-09-28 DIAGNOSIS — E038 Other specified hypothyroidism: Secondary | ICD-10-CM

## 2020-09-28 DIAGNOSIS — I10 Essential (primary) hypertension: Secondary | ICD-10-CM

## 2020-09-28 DIAGNOSIS — Z79899 Other long term (current) drug therapy: Secondary | ICD-10-CM

## 2020-09-28 DIAGNOSIS — I2510 Atherosclerotic heart disease of native coronary artery without angina pectoris: Secondary | ICD-10-CM

## 2020-09-28 DIAGNOSIS — E118 Type 2 diabetes mellitus with unspecified complications: Secondary | ICD-10-CM

## 2020-09-28 DIAGNOSIS — E782 Mixed hyperlipidemia: Secondary | ICD-10-CM

## 2020-09-28 MED ORDER — ALBUTEROL SULFATE HFA 108 (90 BASE) MCG/ACT IN AERS: 2.0000 | INHALATION_SPRAY | Freq: Four times a day (QID) | RESPIRATORY_TRACT | 1 refills | Status: DC | PRN

## 2020-09-28 MED ORDER — ATORVASTATIN CALCIUM 80 MG PO TABS
80.0000 mg | ORAL_TABLET | Freq: Every day | ORAL | 2 refills | Status: DC
Start: 2020-09-28 — End: 2021-08-29

## 2020-09-28 MED ORDER — LISINOPRIL 2.5 MG PO TABS: 2.5000 mg | ORAL_TABLET | Freq: Every day | ORAL | 3 refills | Status: DC

## 2020-09-28 MED ORDER — CARVEDILOL 6.25 MG PO TABS: 6.2500 mg | ORAL_TABLET | Freq: Two times a day (BID) | ORAL | 3 refills | Status: DC

## 2020-09-28 NOTE — Progress Notes (Signed)
Clinical Summary Linda Lin is a 50 y.o.female seen today for follow up of the following medical problems.   1. CAD - admitted 05/2020 with NSTEMI - cath as reported below, 99% RPDA, 70% D1.  Given that the patient was chest pain-free and onset of MI was more than 48 hours, risks of PCI on right PDA were felt to outweigh the benefits and the overall supplied territory is relatively small. Therefore, medical therapy was recommended - Echo showed LVEF of 55-60% with normal wall motion and grade 1 diastolic dysfunction.   - infrequent chest pains at times, short in duration.  - compliant with meds though ran out of coreg last week, atorva few days ago  2. Hyperlipidemia - started on lipitor during 05/2020 admission  3. HTN - ran out of coreg last week.    4. DM2  5. Hypothyroid - on synthroid.   SH: not vaccinated.  Works as Location manager. Awaiting insurance to be approved, should be within next few weeks Past Medical History:  Diagnosis Date  . Asthma   . Cellulitis   . Scabies   . Thyroid disorder      Allergies  Allergen Reactions  . Other Hives    shellfish  . Cephalexin Rash  . Doxycycline Rash     Current Outpatient Medications  Medication Sig Dispense Refill  . acetaminophen (TYLENOL) 500 MG tablet Take 500 mg by mouth every 6 (six) hours as needed for mild pain or moderate pain.     Marland Kitchen albuterol (PROAIR HFA) 108 (90 Base) MCG/ACT inhaler Inhale 2 puffs into the lungs every 6 (six) hours as needed for wheezing or shortness of breath.    Marland Kitchen aspirin 81 MG chewable tablet Chew 1 tablet (81 mg total) by mouth daily.    Marland Kitchen atorvastatin (LIPITOR) 80 MG tablet Take 1 tablet (80 mg total) by mouth daily. 30 tablet 2  . carvedilol (COREG) 3.125 MG tablet Take 1 tablet (3.125 mg total) by mouth 2 (two) times daily with a meal. 60 tablet 2  . clopidogrel (PLAVIX) 75 MG tablet Take 1 tablet (75 mg total) by mouth daily with breakfast. 30 tablet 11  .  levothyroxine (SYNTHROID) 112 MCG tablet Take 1 tablet (112 mcg total) by mouth daily before breakfast for 180 doses. 90 tablet 1  . nitroGLYCERIN (NITROSTAT) 0.4 MG SL tablet Place 1 tablet (0.4 mg total) under the tongue every 5 (five) minutes x 3 doses as needed for chest pain. 25 tablet 2   No current facility-administered medications for this visit.     Past Surgical History:  Procedure Laterality Date  . LEFT HEART CATH AND CORONARY ANGIOGRAPHY N/A 06/09/2020   Procedure: LEFT HEART CATH AND CORONARY ANGIOGRAPHY;  Surgeon: Iran Ouch, MD;  Location: [facility] INVASIVE CV LAB;  Service: Cardiovascular;  Laterality: N/A;     Allergies  Allergen Reactions  . Other Hives    shellfish  . Cephalexin Rash  . Doxycycline Rash      Family History  Problem Relation Age of Onset  . Asthma Mother   . Diabetes Father   . Kidney failure Father   . Hypertension Sister   . Diabetes Mellitus II Brother   . Hypertension Brother   . CAD Maternal Grandmother   . Cancer Neg Hx      Social History Ms. Hendler reports that she has been smoking cigarettes. She started smoking about 32 years ago. She has a 5.00 pack-year smoking history. She  has never used smokeless tobacco. Ms. Eifert reports current alcohol use.   Review of Systems CONSTITUTIONAL: No weight loss, fever, chills, weakness or fatigue.  HEENT: Eyes: No visual loss, blurred vision, double vision or yellow sclerae.No hearing loss, sneezing, congestion, runny nose or sore throat.  SKIN: No rash or itching.  CARDIOVASCULAR: per hpi RESPIRATORY: No shortness of breath, cough or sputum.  GASTROINTESTINAL: No anorexia, nausea, vomiting or diarrhea. No abdominal pain or blood.  GENITOURINARY: No burning on urination, no polyuria NEUROLOGICAL: No headache, dizziness, syncope, paralysis, ataxia, numbness or tingling in the extremities. No change in bowel or bladder control.  MUSCULOSKELETAL: No muscle, back pain, joint pain  or stiffness.  LYMPHATICS: No enlarged nodes. No history of splenectomy.  PSYCHIATRIC: No history of depression or anxiety.  ENDOCRINOLOGIC: No reports of sweating, cold or heat intolerance. No polyuria or polydipsia.  Marland Kitchen   Physical Examination Today's Vitals   09/28/20 0918  BP: 140/80  Pulse: 91  Resp: 16  SpO2: 99%  Weight: 179 lb 6.4 oz (81.4 kg)  Height: 5\' 3"  (1.6 m)   Body mass index is 31.78 kg/m.  Gen: resting comfortably, no acute distress HEENT: no scleral icterus, pupils equal round and reactive, no palptable cervical adenopathy,  CV: RRR, no m/r,g, no jvd Resp: Clear to auscultation bilaterally GI: abdomen is soft, non-tender, non-distended, normal bowel sounds, no hepatosplenomegaly MSK: extremities are warm, no edema.  Skin: warm, no rash Neuro:  no focal deficits Psych: appropriate affect       Assessment and Plan  1. CAD - mild infrequent symptoms, increase coreg to 6.25mg  bid and monitor - in setting of prior ACS and DM2 start lisinopril 2.5mg  daily  2. HTN -above goal. Increasing coreg and starting lisinopril as reported above - recheck kidney function and K 2 weeks after starting lisinopril  3. Hyperlipidemia - repeat labs, continue statin   Strongly encouraged to establish with pcp to manage her DM2 and hypothryoidism.    , M.D.

## 2020-09-28 NOTE — Patient Instructions (Signed)
Medication Instructions:   Your physician has recommended you make the following change in your medication:   Increase carvedilol to 6.25 mg by mouth twice daily  Start lisinopril 2.5 mg by mouth daily  Continue other medications the same  Labwork: Your physician recommends that you return for a FASTING lipid panel, CMET, TSH and HgA1C in 2 weeks. Please do not eat or drink for at least 8 hours when you have this done. You may take your medications that morning with a sip of water.  This may be done at [facility].  Testing/Procedures:  None  Follow-Up:  Your physician recommends that you schedule a follow-up appointment in: 3 months.  Any Other Special Instructions Will Be Listed Below (If Applicable).  Please call [facility] to get assistance finding a family doctor (Call [phone])   If you need a refill on your cardiac medications before your next appointment, please call your pharmacy.

## 2020-10-12 ENCOUNTER — Other Ambulatory Visit (HOSPITAL_COMMUNITY)
Admission: RE | Admit: 2020-10-12 | Discharge: 2020-10-12 | Disposition: A | Discharge status: Home / Self Care | Payer: Self-pay | Source: Ambulatory Visit | Attending: Cardiology | Admitting: Cardiology

## 2020-10-12 ENCOUNTER — Other Ambulatory Visit: Payer: Self-pay

## 2020-10-12 DIAGNOSIS — Z79899 Other long term (current) drug therapy: Secondary | ICD-10-CM | POA: Insufficient documentation

## 2020-10-12 LAB — COMPREHENSIVE METABOLIC PANEL
ALT: 14 U/L (ref 0–44)
AST: 15 U/L (ref 15–41)
Albumin: 3.9 g/dL (ref 3.5–5.0)
Alkaline Phosphatase: 80 U/L (ref 38–126)
Anion gap: 7 (ref 5–15)
BUN: 16 mg/dL (ref 6–20)
CO2: 28 mmol/L (ref 22–32)
Calcium: 9.1 mg/dL (ref 8.9–10.3)
Chloride: 100 mmol/L (ref 98–111)
Creatinine, Ser: 0.95 mg/dL (ref 0.44–1.00)
GFR, Estimated: >60 mL/min (ref >60)
Glucose, Bld: 131 mg/dL — ABNORMAL HIGH (ref 70–99)
Potassium: 4.3 mmol/L (ref 3.5–5.1)
Sodium: 135 mmol/L (ref 135–145)
Total Bilirubin: 0.2 mg/dL — ABNORMAL LOW (ref 0.3–1.2)
Total Protein: 7.7 g/dL (ref 6.5–8.1)

## 2020-10-12 LAB — LIPID PANEL
Cholesterol: 153 mg/dL (ref 0–200)
HDL: 69 mg/dL (ref >40)
LDL Cholesterol: 72 mg/dL (ref 0–99)
Total CHOL/HDL Ratio: 2.2 RATIO
Triglycerides: 58 mg/dL (ref <150)
VLDL: 12 mg/dL (ref 0–40)

## 2020-10-12 LAB — HEMOGLOBIN A1C
Hgb A1c MFr Bld: 6.8 % — ABNORMAL HIGH (ref 4.8–5.6)
Mean Plasma Glucose: 148.46 mg/dL

## 2020-10-12 LAB — TSH: TSH: 40.039 u[IU]/mL — ABNORMAL HIGH (ref 0.350–4.500)

## 2020-10-24 ENCOUNTER — Telehealth: Payer: Self-pay | Admitting: *Deleted

## 2020-10-24 NOTE — Telephone Encounter (Signed)
Correction routed to Dr Fransico Him

## 2020-10-24 NOTE — Telephone Encounter (Signed)
-----   Message from Antoine Poche, MD sent at 10/24/2020 12:34 PM EST ----- Thyroid level is very low, she needs to contact Dr Isidoro Donning office and get a f/u appt to address. Clarify she has been taking her synthroid daily  Dominga Ferry MD

## 2020-10-24 NOTE — Telephone Encounter (Signed)
Pt voiced understanding and clarified she has been taking synthroid daily - routed to pcp

## 2020-11-24 ENCOUNTER — Encounter (HOSPITAL_COMMUNITY): Payer: Self-pay | Admitting: *Deleted

## 2020-11-24 ENCOUNTER — Observation Stay (HOSPITAL_COMMUNITY)
Admission: EM | Admit: 2020-11-24 | Discharge: 2020-11-25 | Disposition: A | Discharge status: Home / Self Care | Payer: BC Managed Care – PPO | Attending: Internal Medicine | Admitting: Internal Medicine

## 2020-11-24 ENCOUNTER — Emergency Department (HOSPITAL_COMMUNITY): Payer: BC Managed Care – PPO

## 2020-11-24 ENCOUNTER — Other Ambulatory Visit: Payer: Self-pay

## 2020-11-24 DIAGNOSIS — R059 Cough, unspecified: Secondary | ICD-10-CM | POA: Diagnosis not present

## 2020-11-24 DIAGNOSIS — Z7982 Long term (current) use of aspirin: Secondary | ICD-10-CM | POA: Insufficient documentation

## 2020-11-24 DIAGNOSIS — E119 Type 2 diabetes mellitus without complications: Secondary | ICD-10-CM | POA: Insufficient documentation

## 2020-11-24 DIAGNOSIS — Z20822 Contact with and (suspected) exposure to covid-19: Secondary | ICD-10-CM | POA: Insufficient documentation

## 2020-11-24 DIAGNOSIS — E039 Hypothyroidism, unspecified: Secondary | ICD-10-CM | POA: Diagnosis not present

## 2020-11-24 DIAGNOSIS — J45901 Unspecified asthma with (acute) exacerbation: Principal | ICD-10-CM | POA: Insufficient documentation

## 2020-11-24 DIAGNOSIS — Z79899 Other long term (current) drug therapy: Secondary | ICD-10-CM | POA: Diagnosis not present

## 2020-11-24 DIAGNOSIS — J9601 Acute respiratory failure with hypoxia: Secondary | ICD-10-CM

## 2020-11-24 DIAGNOSIS — E118 Type 2 diabetes mellitus with unspecified complications: Secondary | ICD-10-CM | POA: Diagnosis present

## 2020-11-24 DIAGNOSIS — I2510 Atherosclerotic heart disease of native coronary artery without angina pectoris: Secondary | ICD-10-CM | POA: Insufficient documentation

## 2020-11-24 DIAGNOSIS — F17210 Nicotine dependence, cigarettes, uncomplicated: Secondary | ICD-10-CM | POA: Diagnosis not present

## 2020-11-24 DIAGNOSIS — J4551 Severe persistent asthma with (acute) exacerbation: Secondary | ICD-10-CM | POA: Diagnosis not present

## 2020-11-24 DIAGNOSIS — R0602 Shortness of breath: Secondary | ICD-10-CM | POA: Diagnosis not present

## 2020-11-24 LAB — CBC WITH DIFFERENTIAL/PLATELET
Abs Immature Granulocytes: 0.06 10*3/uL (ref 0.00–0.07)
Basophils Absolute: 0 10*3/uL (ref 0.0–0.1)
Basophils Relative: 0 %
Eosinophils Absolute: 0 10*3/uL (ref 0.0–0.5)
Eosinophils Relative: 0 %
HCT: 37.2 % (ref 36.0–46.0)
Hemoglobin: 11.6 g/dL — ABNORMAL LOW (ref 12.0–15.0)
Immature Granulocytes: 1 %
Lymphocytes Relative: 13 %
Lymphs Abs: 1.4 10*3/uL (ref 0.7–4.0)
MCH: 33 pg (ref 26.0–34.0)
MCHC: 31.2 g/dL (ref 30.0–36.0)
MCV: 106 fL — ABNORMAL HIGH (ref 80.0–100.0)
Monocytes Absolute: 0.2 10*3/uL (ref 0.1–1.0)
Monocytes Relative: 2 %
Neutro Abs: 8.8 10*3/uL — ABNORMAL HIGH (ref 1.7–7.7)
Neutrophils Relative %: 84 %
Platelets: 320 10*3/uL (ref 150–400)
RBC: 3.51 MIL/uL — ABNORMAL LOW (ref 3.87–5.11)
RDW: 14.3 % (ref 11.5–15.5)
WBC: 10.4 10*3/uL (ref 4.0–10.5)
nRBC: 0 % (ref 0.0–0.2)

## 2020-11-24 LAB — BASIC METABOLIC PANEL
Anion gap: 10 (ref 5–15)
BUN: 20 mg/dL (ref 6–20)
CO2: 24 mmol/L (ref 22–32)
Calcium: 8.8 mg/dL — ABNORMAL LOW (ref 8.9–10.3)
Chloride: 104 mmol/L (ref 98–111)
Creatinine, Ser: 0.91 mg/dL (ref 0.44–1.00)
GFR, Estimated: >60 mL/min (ref >60)
Glucose, Bld: 186 mg/dL — ABNORMAL HIGH (ref 70–99)
Potassium: 4.1 mmol/L (ref 3.5–5.1)
Sodium: 138 mmol/L (ref 135–145)

## 2020-11-24 LAB — RESP PANEL BY RT-PCR (FLU A&B, COVID) ARPGX2
Influenza A by PCR: NEGATIVE
Influenza B by PCR: NEGATIVE
SARS Coronavirus 2 by RT PCR: NEGATIVE

## 2020-11-24 LAB — GLUCOSE, CAPILLARY: Glucose-Capillary: 172 mg/dL — ABNORMAL HIGH (ref 70–99)

## 2020-11-24 MED ORDER — ALBUTEROL (5 MG/ML) CONTINUOUS INHALATION SOLN
10.0000 mg/h | INHALATION_SOLUTION | Freq: Once | RESPIRATORY_TRACT | Status: AC
  Administered 2020-11-24: 10 mg/h via RESPIRATORY_TRACT
  Filled 2020-11-24: qty 20

## 2020-11-24 MED ORDER — ONDANSETRON HCL 4 MG PO TABS: 4.0000 mg | ORAL_TABLET | Freq: Four times a day (QID) | ORAL | Status: DC | PRN

## 2020-11-24 MED ORDER — METHYLPREDNISOLONE SODIUM SUCC 125 MG IJ SOLR
60.0000 mg | Freq: Four times a day (QID) | INTRAMUSCULAR | Status: DC
  Administered 2020-11-24 – 2020-11-25 (×3): 60 mg via INTRAVENOUS
  Filled 2020-11-24 (×3): qty 2

## 2020-11-24 MED ORDER — FLUTICASONE FUROATE-VILANTEROL 100-25 MCG/INH IN AEPB
1.0000 | INHALATION_SPRAY | Freq: Every day | RESPIRATORY_TRACT | Status: DC
  Administered 2020-11-25: 1 via RESPIRATORY_TRACT
  Filled 2020-11-24: qty 28

## 2020-11-24 MED ORDER — POLYETHYLENE GLYCOL 3350 17 G PO PACK: 17.0000 g | PACK | Freq: Every day | ORAL | Status: DC | PRN

## 2020-11-24 MED ORDER — METHYLPREDNISOLONE SODIUM SUCC 125 MG IJ SOLR
125.0000 mg | Freq: Once | INTRAMUSCULAR | Status: AC
  Administered 2020-11-24: 125 mg via INTRAVENOUS
  Filled 2020-11-24: qty 2

## 2020-11-24 MED ORDER — INSULIN ASPART 100 UNIT/ML ~~LOC~~ SOLN: 0.0000 [IU] | Freq: Every day | SUBCUTANEOUS | Status: DC

## 2020-11-24 MED ORDER — IPRATROPIUM-ALBUTEROL 0.5-2.5 (3) MG/3ML IN SOLN
3.0000 mL | Freq: Once | RESPIRATORY_TRACT | Status: AC
  Administered 2020-11-24: 3 mL via RESPIRATORY_TRACT
  Filled 2020-11-24: qty 3

## 2020-11-24 MED ORDER — LISINOPRIL 5 MG PO TABS
2.5000 mg | ORAL_TABLET | Freq: Every day | ORAL | Status: DC
  Administered 2020-11-24: 2.5 mg via ORAL
  Filled 2020-11-24: qty 1

## 2020-11-24 MED ORDER — OXYCODONE HCL 5 MG PO TABS: 5.0000 mg | ORAL_TABLET | ORAL | Status: DC | PRN

## 2020-11-24 MED ORDER — ASPIRIN 81 MG PO CHEW
81.0000 mg | CHEWABLE_TABLET | Freq: Every day | ORAL | Status: DC
  Administered 2020-11-24 – 2020-11-25 (×2): 81 mg via ORAL
  Filled 2020-11-24 (×2): qty 1

## 2020-11-24 MED ORDER — AEROCHAMBER PLUS FLO-VU MEDIUM MISC
1.0000 | Freq: Once | Status: AC
  Administered 2020-11-24: 1
  Filled 2020-11-24: qty 1

## 2020-11-24 MED ORDER — PREDNISONE 20 MG PO TABS: 40.0000 mg | ORAL_TABLET | Freq: Every day | ORAL | Status: DC

## 2020-11-24 MED ORDER — MAGNESIUM SULFATE 2 GM/50ML IV SOLN
2.0000 g | Freq: Once | INTRAVENOUS | Status: AC
  Administered 2020-11-24: 2 g via INTRAVENOUS
  Filled 2020-11-24: qty 50

## 2020-11-24 MED ORDER — ACETAMINOPHEN 650 MG RE SUPP: 650.0000 mg | Freq: Four times a day (QID) | RECTAL | Status: DC | PRN

## 2020-11-24 MED ORDER — HEPARIN SODIUM (PORCINE) 5000 UNIT/ML IJ SOLN
5000.0000 [IU] | Freq: Three times a day (TID) | INTRAMUSCULAR | Status: DC
  Administered 2020-11-25: 5000 [IU] via SUBCUTANEOUS
  Filled 2020-11-24: qty 1

## 2020-11-24 MED ORDER — IPRATROPIUM BROMIDE HFA 17 MCG/ACT IN AERS: 2.0000 | INHALATION_SPRAY | Freq: Once | RESPIRATORY_TRACT | Status: DC

## 2020-11-24 MED ORDER — ALBUTEROL SULFATE HFA 108 (90 BASE) MCG/ACT IN AERS
6.0000 | INHALATION_SPRAY | Freq: Once | RESPIRATORY_TRACT | Status: AC
  Administered 2020-11-24: 6 via RESPIRATORY_TRACT

## 2020-11-24 MED ORDER — ONDANSETRON HCL 4 MG/2ML IJ SOLN: 4.0000 mg | Freq: Four times a day (QID) | INTRAMUSCULAR | Status: DC | PRN

## 2020-11-24 MED ORDER — ACETAMINOPHEN 325 MG PO TABS
650.0000 mg | ORAL_TABLET | Freq: Four times a day (QID) | ORAL | Status: DC | PRN
  Administered 2020-11-24: 650 mg via ORAL
  Filled 2020-11-24: qty 2

## 2020-11-24 MED ORDER — CARVEDILOL 3.125 MG PO TABS
6.2500 mg | ORAL_TABLET | Freq: Two times a day (BID) | ORAL | Status: DC
Start: 2020-11-24 — End: 2020-11-25
  Administered 2020-11-24 – 2020-11-25 (×2): 6.25 mg via ORAL
  Filled 2020-11-24 (×2): qty 2

## 2020-11-24 MED ORDER — IPRATROPIUM-ALBUTEROL 0.5-2.5 (3) MG/3ML IN SOLN
3.0000 mL | Freq: Four times a day (QID) | RESPIRATORY_TRACT | Status: DC
  Administered 2020-11-25 (×2): 3 mL via RESPIRATORY_TRACT
  Filled 2020-11-24 (×2): qty 3

## 2020-11-24 MED ORDER — INSULIN ASPART 100 UNIT/ML ~~LOC~~ SOLN
0.0000 [IU] | Freq: Three times a day (TID) | SUBCUTANEOUS | Status: DC
  Administered 2020-11-25: 5 [IU] via SUBCUTANEOUS

## 2020-11-24 MED ORDER — CLOPIDOGREL BISULFATE 75 MG PO TABS
75.0000 mg | ORAL_TABLET | Freq: Every day | ORAL | Status: DC
  Administered 2020-11-25: 75 mg via ORAL
  Filled 2020-11-24: qty 1

## 2020-11-24 MED ORDER — ALBUTEROL SULFATE HFA 108 (90 BASE) MCG/ACT IN AERS
6.0000 | INHALATION_SPRAY | Freq: Once | RESPIRATORY_TRACT | Status: AC
  Administered 2020-11-24: 6 via RESPIRATORY_TRACT
  Filled 2020-11-24: qty 6.7

## 2020-11-24 MED ORDER — IPRATROPIUM-ALBUTEROL 0.5-2.5 (3) MG/3ML IN SOLN: 3.0000 mL | RESPIRATORY_TRACT | Status: DC | PRN

## 2020-11-24 MED ORDER — ATORVASTATIN CALCIUM 40 MG PO TABS
80.0000 mg | ORAL_TABLET | Freq: Every evening | ORAL | Status: DC
  Administered 2020-11-24: 80 mg via ORAL
  Filled 2020-11-24: qty 2

## 2020-11-24 NOTE — ED Notes (Signed)
Pt in bed, pt states that she is breathing easier, pt continues to have bilateral wheezes. Talking in full sentences

## 2020-11-24 NOTE — ED Provider Notes (Signed)
[facility] EMERGENCY DEPARTMENT Provider Note   CSN: [id number] Arrival date & time: 11/24/20  1308     History Chief Complaint  Patient presents with  . Shortness of Breath    Linda Lin is a 50 y.o. female.  HPI   50 year old female with a history of asthma, cellulitis, scabies, CAD, NSTEMI, hyperlipidemia, diabetes, Graves' disease, who presents to the emergency department today for evaluation of shortness of breath.  States that for the last week she has had a cough, increased wheezing and shortness of breath.  She denies any chest pain or pleuritic pain.  She has a history of chronic bilateral lower extremity swelling which is unchanged from prior.  Denies any calf pain.  She has had some rhinorrhea but denies any fevers or other associated URI symptoms.  States she has similar symptoms every year but ran out of her inhaler 1 month ago.  She uses tobacco.  Past Medical History:  Diagnosis Date  . Asthma   . Cellulitis   . Scabies   . Thyroid disorder     Patient Active Problem List   Diagnosis Date Noted  . Asthma exacerbation 11/24/2020  . CAD (coronary artery disease) 06/10/2020  . Hyperlipidemia 06/10/2020  . Type 2 diabetes mellitus with complication, without long-term current use of insulin (HCC) 06/10/2020  . Hypothyroidism 06/10/2020  . NSTEMI (non-ST elevated myocardial infarction) (HCC) 06/09/2020  . Hypothyroidism following radioiodine therapy 02/24/2018  . Graves' disease 09/19/2017  . Contact dermatitis 12/21/2015  . Bilateral lower leg cellulitis, recurrent 12/19/2015  . Lower extremity edema 12/19/2015  . History of asthma, mild 12/19/2015  . Maculopapular rash, generalized 08/30/2015  . Tobacco abuse 08/30/2015  . Cellulitis of both lower extremities 08/27/2015    Past Surgical History:  Procedure Laterality Date  . LEFT HEART CATH AND CORONARY ANGIOGRAPHY N/A 06/09/2020   Procedure: LEFT HEART CATH AND CORONARY ANGIOGRAPHY;  Surgeon:  Iran Ouch, MD;  Location: [facility] INVASIVE [facility];  Service: Cardiovascular;  Laterality: N/A;     OB History    Gravida  0   Para  0   Term  0   Preterm  0   AB  0   Living  0     SAB  0   IAB  0   Ectopic  0   Multiple  0   Live Births              Family History  Problem Relation Age of Onset  . Asthma Mother   . Diabetes Father   . Kidney failure Father   . Hypertension Sister   . Diabetes Mellitus II Brother   . Hypertension Brother   . CAD Maternal Grandmother   . Cancer Neg Hx     Social History   Tobacco Use  . Smoking status: Current Every Day Smoker    Packs/day: 0.25    Years: 20.00    Pack years: 5.00    Types: Cigarettes    Start date: 10/14/1987  . Smokeless tobacco: Never Used  Vaping Use  . Vaping Use: Never used  Substance Use Topics  . Alcohol use: Yes    Comment: occ  . Drug use: Not Currently    Types: Marijuana    Comment: sporadically-last used sept 2019    Home Medications Prior to Admission medications   Medication Sig Start Date End Date Taking? Authorizing Provider  acetaminophen (TYLENOL) 500 MG tablet Take 500 mg by mouth every 6 (  six) hours as needed for mild pain or moderate pain.    Yes [provider]  albuterol (VENTOLIN HFA) 108 (90 Base) MCG/ACT inhaler Inhale 2 puffs into the lungs every 6 (six) hours as needed for wheezing or shortness of breath. 09/28/20  Yes BranchDorothe Pea, MD  aspirin 81 MG chewable tablet Chew 1 tablet (81 mg total) by mouth daily. 06/10/20  Yes Marjie Skiff E, PA-C  atorvastatin (LIPITOR) 80 MG tablet Take 1 tablet (80 mg total) by mouth daily. 09/28/20  Yes Branch, Dorothe Pea, MD  carvedilol (COREG) 6.25 MG tablet Take 1 tablet (6.25 mg total) by mouth 2 (two) times daily. 09/28/20  Yes BranchDorothe Pea, MD  clopidogrel (PLAVIX) 75 MG tablet Take 1 tablet (75 mg total) by mouth daily with breakfast. 06/10/20  Yes Marjie Skiff E, PA-C  lisinopril (ZESTRIL) 2.5 MG tablet  Take 1 tablet (2.5 mg total) by mouth daily. 09/28/20 12/27/20 Yes BranchDorothe Pea, MD  metFORMIN (GLUCOPHAGE) 500 MG tablet Take 250 mg by mouth 2 (two) times daily. 09/17/20  Yes [provider]  nitroGLYCERIN (NITROSTAT) 0.4 MG SL tablet Place 1 tablet (0.4 mg total) under the tongue every 5 (five) minutes x 3 doses as needed for chest pain. 06/10/20  Yes Marjie Skiff E, PA-C  triamcinolone (KENALOG) 0.1 % Apply topically 2 (two) times daily. 10/14/20  Yes [provider]  levothyroxine (SYNTHROID) 112 MCG tablet Take 1 tablet (112 mcg total) by mouth daily before breakfast for 180 doses. Patient not taking: Reported on 11/24/2020 01/04/20 07/02/20  Roma Kayser, MD    Allergies    Other, Cephalexin, and Doxycycline  Review of Systems   Review of Systems  Constitutional: Negative for fever.  HENT: Positive for rhinorrhea. Negative for ear pain and sore throat.   Eyes: Negative for visual disturbance.  Respiratory: Positive for cough, shortness of breath and wheezing.   Cardiovascular: Positive for leg swelling (chronic, unchanged). Negative for chest pain.  Gastrointestinal: Negative for abdominal pain, constipation, diarrhea, nausea and vomiting.  Genitourinary: Negative for dysuria and hematuria.  Musculoskeletal: Negative for back pain.  Skin: Negative for rash.  Neurological: Negative for headaches.  All other systems reviewed and are negative.   Physical Exam Updated Vital Signs BP (!) 151/95 (BP Location: Left Arm)   Pulse 80   Temp 97.9 F (36.6 C) (Oral)   Resp (!) 21   Ht 5\' 3"  (1.6 m)   Wt 81.2 kg   LMP 05/08/2020   SpO2 93%   BMI 31.71 kg/m   Physical Exam Vitals and nursing note reviewed.  Constitutional:      General: She is not in acute distress.    Appearance: She is well-developed and well-nourished.  HENT:     Head: Normocephalic and atraumatic.  Eyes:     Conjunctiva/sclera: Conjunctivae normal.  Cardiovascular:     Rate  and Rhythm: Normal rate and regular rhythm.     Heart sounds: Normal heart sounds. No murmur heard.   Pulmonary:     Effort: Tachypnea present. No respiratory distress.     Breath sounds: Wheezing present.     Comments: Speaking in complete sentences Abdominal:     Palpations: Abdomen is soft.     Tenderness: There is no abdominal tenderness.  Musculoskeletal:        General: No edema.     Cervical back: Neck supple.     Comments: Trace ble edema (chronic, baseline), no calf ttp  Skin:  General: Skin is warm and dry.  Neurological:     Mental Status: She is alert.  Psychiatric:        Mood and Affect: Mood and affect normal.     ED Results / Procedures / Treatments   Labs (all labs ordered are listed, but only abnormal results are displayed) Labs Reviewed  CBC WITH DIFFERENTIAL/PLATELET - Abnormal; Notable for the following components:      Result Value   RBC 3.51 (*)    Hemoglobin 11.6 (*)    MCV 106.0 (*)    Neutro Abs 8.8 (*)    All other components within normal limits  BASIC METABOLIC PANEL - Abnormal; Notable for the following components:   Glucose, Bld 186 (*)    Calcium 8.8 (*)    All other components within normal limits  RESP PANEL BY RT-PCR (FLU A&B, COVID) [id number]    EKG EKG Interpretation  Date/Time:  Friday November 24 2020 14:07:07 EST Ventricular Rate:  84 PR Interval:    QRS Duration: 89 QT Interval:  377 QTC Calculation: 446 R Axis:   86 Text Interpretation: Sinus rhythm Non-specific ST-t changes Confirmed by Cathren Laine ([id number]) on 11/24/2020 2:32:51 PM   Radiology DG Chest Portable 1 View  Result Date: 11/24/2020 CLINICAL DATA:  Cough with shortness of breath for 1 week. History of asthma and smoking. EXAM: PORTABLE CHEST 1 VIEW COMPARISON:  Radiographs 06/09/2020 FINDINGS: 1404 hours. The heart size and mediastinal contours are normal. The lungs are clear. There is no pleural effusion or pneumothorax. No acute osseous findings are identified.  Telemetry leads overlie the chest. IMPRESSION: No active cardiopulmonary process. Electronically Signed   By: Carey Bullocks M.D.   On: 11/24/2020 14:32    Procedures Procedures   CRITICAL CARE Performed by: Karrie Meres   Total critical care time: 45 minutes  Critical care time was exclusive of separately billable procedures and treating other patients.  Critical care was necessary to treat or prevent imminent or life-threatening deterioration.  Critical care was time spent personally by me on the following activities: development of treatment plan with patient and/or surrogate as well as nursing, discussions with consultants, evaluation of patient's response to treatment, examination of patient, obtaining history from patient or surrogate, ordering and performing treatments and interventions, ordering and review of laboratory studies, ordering and review of radiographic studies, pulse oximetry and re-evaluation of patient's condition.   Medications Ordered in ED Medications  methylPREDNISolone sodium succinate (SOLU-MEDROL) 125 mg/2 mL injection 125 mg (125 mg Intravenous Given 11/24/20 1414)  albuterol (VENTOLIN HFA) 108 (90 Base) MCG/ACT inhaler 6 puff (6 puffs Inhalation Given 11/24/20 1409)  AeroChamber Plus Flo-Vu Medium MISC 1 each (1 each Other Given 11/24/20 1410)  albuterol (VENTOLIN HFA) 108 (90 Base) MCG/ACT inhaler 6 puff (6 puffs Inhalation Given 11/24/20 1516)  ipratropium-albuterol (DUONEB) 0.5-2.5 (3) MG/3ML nebulizer solution 3 mL (3 mLs Nebulization Given 11/24/20 1534)  albuterol (PROVENTIL,VENTOLIN) solution continuous neb (10 mg/hr Nebulization Given 11/24/20 1739)  magnesium sulfate IVPB 2 g 50 mL (0 g Intravenous Stopped 11/24/20 1815)    ED Course  I have reviewed the triage vital signs and the nursing notes.  Pertinent labs & imaging results that were available during my care of the patient were reviewed by me and considered in my medical decision making (see chart  for details).    MDM Rules/Calculators/A&P  50 y/o F presenting for eval of sob, wheezing and cough. Hx asthma and chronic tobacco use. Ran out of inhaler 1 month ago.   EKG - Sinus rhythm Non-specific ST-t changes  CXR reviewed/interpreted - No active cardiopulmonary process.  COVID test neg  Pt given albuterol, solumedrol, mag. On reassessment she feels some improvement but is not completely back to baseline.  Will trial continuous neb utilizer treatment and reassess.  Following continuous neb, patient reports improvement however was ambulated and became significantly short of breath and sats dropped to 87% on room air.  She intermittently drops down to the 80s with talking.  Will order labs and plan for admission for asthma exacerbation.  Reviewed/interpreted labs.  CBC and BMP are reassuring.  Will consult hospitalist.  8:20 PM CONSULT with Dr. Chipper Herb with hospitalist service who accepts patient for admission.    Final Clinical Impression(s) / ED Diagnoses Final diagnoses:  Exacerbation of asthma, unspecified asthma severity, unspecified whether persistent    Rx / DC Orders ED Discharge Orders    None       Rayne Du 11/24/20 2046    Cathren Laine, MD 11/25/20 1549

## 2020-11-24 NOTE — ED Notes (Signed)
Pt tolerated ambulation around nurses station well, no complaints. Oxygen saturation 92-97% on RA, HR 85-88 bpm. Pt sat down on stretcher after ambulating, c/o SOB, oxygen levels hovering between 87-92% on RA. RN aware.

## 2020-11-24 NOTE — H&P (Addendum)
TRH H&P    Patient Demographics:    Linda Lin, is a 50 y.o. female  MRN: [id number]  DOB - 10-16-1970  Admit Date - 11/24/2020  Referring MD/NP/PA: Cortni  Outpatient Primary MD for the patient is Patient, No Pcp Per  Patient coming from: Home  Chief complaint- Asthma   HPI:    Linda Lin  is a 50 y.o. female, with history of asthma, coronary artery disease, thyroid disease, and more presents the ED with a chief complaint of dyspnea.  Patient reports that at baseline she uses her albuterol twice per day 3 days/week.  She ran out of her albuterol 2 weeks ago.  1 week ago she started feeling short of breath.  Is been progressively worse since then.  Exertion makes it worse, laying flat also makes it worse.  Rest makes it better.  She has not been able to try her inhaler to see if that makes it better, as she has run out of her inhaler.  She reports audible wheezing at home.  She is also had a cough with clear sputum.  She denies chest pain, syncope/near syncope, fevers, body aches.  Patient is currently smoking.  She had a heart attack in September 2021 and she has been trying to quit since then.  She is now smoking 1 pack/week.  She also has a family member that smokes in the house.  No new pets, detergents, potpourri, essential oil diffuser, perfume, or other exposures.  Patient has quit drinking since her heart attack last year.  She is vaccinated for COVID.  Patient is full code.  In the ED Temp 97.9, heart rate 64-84, respiratory rate 12-21, blood pressure 151/95, satting at 93% on room air at rest No leukocytosis with a white blood cell count of 10.4, hemoglobin 11.6 Chemistry panel is unremarkable except for hyperglycemia at 186 Negative Covid test Chest x-ray shows no active disease Patient was given albuterol continuous neb, 6 puffs on albuterol inhaler, DuoNeb, mag sulfate, Solu-Medrol in the  ED, patient continued to wheeze and feel short of breath with ambulation Admission requested for further treatment of asthma exacerbation   Review of systems:    In addition to the HPI above,  No Fever-chills, No Headache, No changes with Vision or hearing, No problems swallowing food or Liquids, No Chest pain, admits to cough and shortness of breath  no Abdominal pain, No Nausea or Vomiting, bowel movements are regular, No Blood in stool or Urine, No dysuria, No new skin rashes or bruises, No new joints pains-aches,  No new weakness, tingling, numbness in any extremity, No recent weight gain or loss, No polyuria, polydypsia or polyphagia, No significant Mental Stressors.  All other systems reviewed and are negative.    Past History of the following :    Past Medical History:  Diagnosis Date  . Asthma   . Cellulitis   . Scabies   . Thyroid disorder       Past Surgical History:  Procedure Laterality Date  . LEFT HEART CATH AND CORONARY ANGIOGRAPHY  N/A 06/09/2020   Procedure: LEFT HEART CATH AND CORONARY ANGIOGRAPHY;  Surgeon: Iran Ouch, MD;  Location: [facility];  Service: Cardiovascular;  Laterality: N/A;      Social History:      Social History   Tobacco Use  . Smoking status: Current Every Day Smoker    Packs/day: 0.25    Years: 20.00    Pack years: 5.00    Types: Cigarettes    Start date: 10/14/1987  . Smokeless tobacco: Never Used  Substance Use Topics  . Alcohol use: Yes    Comment: occ       Family History :     Family History  Problem Relation Age of Onset  . Asthma Mother   . Diabetes Father   . Kidney failure Father   . Hypertension Sister   . Diabetes Mellitus II Brother   . Hypertension Brother   . CAD Maternal Grandmother   . Cancer Neg Hx       Home Medications:   Prior to Admission medications   Medication Sig Start Date End Date Taking? Authorizing Provider  acetaminophen (TYLENOL) 500 MG tablet Take 500 mg  by mouth every 6 (six) hours as needed for mild pain or moderate pain.    Yes [provider]  albuterol (VENTOLIN HFA) 108 (90 Base) MCG/ACT inhaler Inhale 2 puffs into the lungs every 6 (six) hours as needed for wheezing or shortness of breath. 09/28/20  Yes BranchDorothe Pea, MD  aspirin 81 MG chewable tablet Chew 1 tablet (81 mg total) by mouth daily. 06/10/20  Yes Marjie Skiff E, PA-C  atorvastatin (LIPITOR) 80 MG tablet Take 1 tablet (80 mg total) by mouth daily. 09/28/20  Yes Branch, Dorothe Pea, MD  carvedilol (COREG) 6.25 MG tablet Take 1 tablet (6.25 mg total) by mouth 2 (two) times daily. 09/28/20  Yes BranchDorothe Pea, MD  clopidogrel (PLAVIX) 75 MG tablet Take 1 tablet (75 mg total) by mouth daily with breakfast. 06/10/20  Yes Marjie Skiff E, PA-C  lisinopril (ZESTRIL) 2.5 MG tablet Take 1 tablet (2.5 mg total) by mouth daily. 09/28/20 12/27/20 Yes BranchDorothe Pea, MD  metFORMIN (GLUCOPHAGE) 500 MG tablet Take 250 mg by mouth 2 (two) times daily. 09/17/20  Yes [provider]  nitroGLYCERIN (NITROSTAT) 0.4 MG SL tablet Place 1 tablet (0.4 mg total) under the tongue every 5 (five) minutes x 3 doses as needed for chest pain. 06/10/20  Yes Marjie Skiff E, PA-C  triamcinolone (KENALOG) 0.1 % Apply topically 2 (two) times daily. 10/14/20  Yes [provider]  levothyroxine (SYNTHROID) 112 MCG tablet Take 1 tablet (112 mcg total) by mouth daily before breakfast for 180 doses. Patient not taking: Reported on 11/24/2020 01/04/20 07/02/20  Roma Kayser, MD     Allergies:     Allergies  Allergen Reactions  . Other Hives    shellfish  . Cephalexin Rash  . Doxycycline Rash     Physical Exam:   Vitals  Blood pressure (!) 151/95, pulse 80, temperature 97.9 F (36.6 C), temperature source Oral, resp. rate (!) 21, height 5\' 3"  (1.6 m), weight 81.2 kg, last menstrual period 05/08/2020, SpO2 93 %.  1.  General: Lying supine in bed with head of bed  elevated, no acute distress  2. Psychiatric: Flat affect, cooperative with exam, alert and oriented x3  3. Neurologic: Face symmetric, speech and language normal, moves all 4 extremities voluntarily, oriented x3, no acute deficit on limited  exam  4. HEENMT:  Head is atraumatic, normocephalic, pupils reactive to light, neck is supple, trachea is midline, mucous membranes are moist  5. Respiratory : No increased work of breathing at rest, no accessory muscle use, talking in full sentences, inspiratory and expiratory wheezing, diminished breath sounds in the lower lung fields, no rhonchi or rales, no cyanosis  6. Cardiovascular : Heart rate is normal, rhythm is regular, no murmurs rubs or gallops  7. Gastrointestinal:  Abdomen is soft, nondistended, nontender to palpation, no palpable masses  8. Skin:  Skin is warm dry and intact without acute lesion on limited exam  9.Musculoskeletal:  No calf tenderness, no peripheral edema, no acute deformity    Data Review:    CBC Recent Labs  Lab 11/24/20 1925  WBC 10.4  HGB 11.6*  HCT 37.2  PLT 320  MCV 106.0*  MCH 33.0  MCHC 31.2  RDW 14.3  LYMPHSABS 1.4  MONOABS 0.2  EOSABS 0.0  BASOSABS 0.0   ------------------------------------------------------------------------------------------------------------------  Results for orders placed or performed during the hospital encounter of 11/24/20 (from the past 48 hour(s))  Resp Panel by RT-PCR (Flu A&B, Covid) Nasopharyngeal Swab     Status: None   Collection Time: 11/24/20  2:21 PM   Specimen: Nasopharyngeal Swab; Nasopharyngeal(NP) swabs in vial transport medium  Result Value Ref Range   SARS Coronavirus 2 by RT PCR NEGATIVE NEGATIVE    Comment: (NOTE) SARS-CoV-2 target nucleic acids are NOT DETECTED.  The SARS-CoV-2 RNA is generally detectable in upper respiratory specimens during the acute phase of infection. The lowest concentration of SARS-CoV-2 viral copies this assay can  detect is 138 copies/mL. A negative result does not preclude SARS-Cov-2 infection and should not be used as the sole basis for treatment or other patient management decisions. A negative result may occur with  improper specimen collection/handling, submission of specimen other than nasopharyngeal swab, presence of viral mutation(s) within the areas targeted by this assay, and inadequate number of viral copies(<138 copies/mL). A negative result must be combined with clinical observations, patient history, and epidemiological information. The expected result is Negative.  Fact Sheet for Patients:  BloggerCourse.com  Fact Sheet for Healthcare Providers:  SeriousBroker.it  This test is no t yet approved or cleared by the [place] FDA and  has been authorized for detection and/or diagnosis of SARS-CoV-2 by FDA under an Emergency Use Authorization (EUA). This EUA will remain  in effect (meaning this test can be used) for the duration of the COVID-19 declaration under Section 564(b)(1) of the Act, 21 U.S.C.section 360bbb-3(b)(1), unless the authorization is terminated  or revoked sooner.       Influenza A by PCR NEGATIVE NEGATIVE   Influenza B by PCR NEGATIVE NEGATIVE    Comment: (NOTE) The Xpert Xpress SARS-CoV-2/FLU/RSV plus assay is intended as an aid in the diagnosis of influenza from Nasopharyngeal swab specimens and should not be used as a sole basis for treatment. Nasal washings and aspirates are unacceptable for Xpert Xpress SARS-CoV-2/FLU/RSV testing.  Fact Sheet for Patients: BloggerCourse.com  Fact Sheet for Healthcare Providers: SeriousBroker.it  This test is not yet approved or cleared by the [place] FDA and has been authorized for detection and/or diagnosis of SARS-CoV-2 by FDA under an Emergency Use Authorization (EUA). This EUA will remain in effect  (meaning this test can be used) for the duration of the COVID-19 declaration under Section 564(b)(1) of the Act, 21 U.S.C. section 360bbb-3(b)(1), unless the authorization is terminated or revoked.  Performed  at [facility], [address], [place], [place] [address]   CBC with Differential     Status: Abnormal   Collection Time: 11/24/20  7:25 PM  Result Value Ref Range   WBC 10.4 4.0 - 10.5 K/uL   RBC 3.51 (L) 3.87 - 5.11 MIL/uL   Hemoglobin 11.6 (L) 12.0 - 15.0 g/dL   HCT 30.8 65.7 - 84.6 %   MCV 106.0 (H) 80.0 - 100.0 fL   MCH 33.0 26.0 - 34.0 pg   MCHC 31.2 30.0 - 36.0 g/dL   RDW 96.2 95.2 - 84.1 %   Platelets 320 150 - 400 K/uL   nRBC 0.0 0.0 - 0.2 %   Neutrophils Relative % 84 %   Neutro Abs 8.8 (H) 1.7 - 7.7 K/uL   Lymphocytes Relative 13 %   Lymphs Abs 1.4 0.7 - 4.0 K/uL   Monocytes Relative 2 %   Monocytes Absolute 0.2 0.1 - 1.0 K/uL   Eosinophils Relative 0 %   Eosinophils Absolute 0.0 0.0 - 0.5 K/uL   Basophils Relative 0 %   Basophils Absolute 0.0 0.0 - 0.1 K/uL   Immature Granulocytes 1 %   Abs Immature Granulocytes 0.06 0.00 - 0.07 K/uL    Comment: Performed at [facility], [address] [place], [place] [address]  Basic metabolic panel     Status: Abnormal   Collection Time: 11/24/20  7:25 PM  Result Value Ref Range   Sodium 138 135 - 145 mmol/L   Potassium 4.1 3.5 - 5.1 mmol/L   Chloride 104 98 - 111 mmol/L   CO2 24 22 - 32 mmol/L   Glucose, Bld 186 (H) 70 - 99 mg/dL    Comment: Glucose reference range applies only to samples taken after fasting for at least 8 hours.   BUN 20 6 - 20 mg/dL   Creatinine, Ser 1.02 0.44 - 1.00 mg/dL   Calcium 8.8 (L) 8.9 - 10.3 mg/dL   GFR, Estimated >72 >53 mL/min    Comment: (NOTE) Calculated using the CKD-EPI Creatinine Equation (2021)    Anion gap 10 5 - 15    Comment: Performed at [facility], [address], [place], [place] [address]    Chemistries  Recent Labs  Lab 11/24/20 1925  NA 138  K 4.1  CL 104   CO2 24  GLUCOSE 186*  BUN 20  CREATININE 0.91  CALCIUM 8.8*   ------------------------------------------------------------------------------------------------------------------  ------------------------------------------------------------------------------------------------------------------ GFR: Estimated Creatinine Clearance: 73.8 mL/min (by C-G formula based on SCr of 0.91 mg/dL). Liver Function Tests: No results for input(s): AST, ALT, ALKPHOS, BILITOT, PROT, ALBUMIN in the last 168 hours. No results for input(s): LIPASE, AMYLASE in the last 168 hours. No results for input(s): AMMONIA in the last 168 hours. Coagulation Profile: No results for input(s): INR, PROTIME in the last 168 hours. Cardiac Enzymes: No results for input(s): CKTOTAL, CKMB, CKMBINDEX, TROPONINI in the last 168 hours. BNP (last 3 results) No results for input(s): PROBNP in the last 8760 hours. HbA1C: No results for input(s): HGBA1C in the last 72 hours. CBG: No results for input(s): GLUCAP in the last 168 hours. Lipid Profile: No results for input(s): CHOL, HDL, LDLCALC, TRIG, CHOLHDL, LDLDIRECT in the last 72 hours. Thyroid Function Tests: No results for input(s): TSH, T4TOTAL, FREET4, T3FREE, THYROIDAB in the last 72 hours. Anemia Panel: No results for input(s): VITAMINB12, FOLATE, FERRITIN, TIBC, IRON, RETICCTPCT in the last 72 hours.  --------------------------------------------------------------------------------------------------------------- Urine analysis:    Component Value Date/Time  COLORURINE YELLOW 06/26/2017 1016   APPEARANCEUR CLEAR 06/26/2017 1016   LABSPEC 1.027 06/26/2017 1016   PHURINE 6.0 06/26/2017 1016   GLUCOSEU NEGATIVE 06/26/2017 1016   HGBUR NEGATIVE 06/26/2017 1016   BILIRUBINUR NEGATIVE 05/09/2017 0549   KETONESUR NEGATIVE 06/26/2017 1016   PROTEINUR NEGATIVE 06/26/2017 1016   NITRITE NEGATIVE 06/26/2017 1016   LEUKOCYTESUR NEGATIVE 06/26/2017 1016      Imaging  Results:    DG Chest Portable 1 View  Result Date: 11/24/2020 CLINICAL DATA:  Cough with shortness of breath for 1 week. History of asthma and smoking. EXAM: PORTABLE CHEST 1 VIEW COMPARISON:  Radiographs 06/09/2020 FINDINGS: 1404 hours. The heart size and mediastinal contours are normal. The lungs are clear. There is no pleural effusion or pneumothorax. No acute osseous findings are identified. Telemetry leads overlie the chest. IMPRESSION: No active cardiopulmonary process. Electronically Signed   By: Carey BullocksWilliam  Veazey M.D.   On: 11/24/2020 14:32    My personal review of EKG: Rhythm NSR, Rate 84 /min, QTc 446,no Acute ST changes   Assessment & Plan:    Active Problems:   Asthma exacerbation   1. Acute respiratory failure with hypoxia 1. O2 sats 89% at presentation 2. After ER treatments - continues to drop with ambulation 3. 2/2 asthma exacerbation 4. See plan below 2. Asthma exacerbation 1. Continue duoneb scheduled, and albuterol PRN 2. Continue steroid 3. Could benefit from prescription for ICS + LABA at discharge, and refill of SABA for rescue 4. No indication for antibiotics at this time 5. Continue to monitor 3. CAD 1. Continue asa, plavix, statin, ACE, beta blocker 2. Chest pain free, no ischemic changes on EKG 3. Continue to monitor 4. Thyroid disease 1. Check TSH in AM 2. Thyroid prescription has not been filled since last year per med review 5. Diabetes mellitus type 2 1. Hold Metformin 2. Sliding scale coverage 3. Heart healthy carb modified diet   DVT Prophylaxis-   heparin - SCDs   AM Labs Ordered, also please review Full Orders  Family Communication: No family at bedside Code Status:  Full  Admission status: Observation Time spent in minutes : 65   Veralyn Lopp B Zierle-Ghosh DO

## 2020-11-24 NOTE — ED Notes (Signed)
Pt in bed, pt talking in full sentences, pt states that she is here for some sob and a productive cough for the past few days, states that she is vaccinated for covid, states that she normally gets a cold about this time every years, states that she has an inhaler at home, but it ran out.  Pt satting 93% on room air, pt requests some O2, pt placed on 2L, pt satting 97% on 2L, pt has inspiratory and expiratory wheezes, pt ns or monitor.

## 2020-11-24 NOTE — ED Notes (Signed)
Pt in bed with eyes closed, pt arouses easily to verbal stim, pt offers no complaints, pt awaits transport to floor

## 2020-11-24 NOTE — ED Triage Notes (Signed)
Shortness of breath for a week, history of asthma

## 2020-11-25 ENCOUNTER — Other Ambulatory Visit: Payer: Self-pay

## 2020-11-25 DIAGNOSIS — E039 Hypothyroidism, unspecified: Secondary | ICD-10-CM | POA: Diagnosis not present

## 2020-11-25 DIAGNOSIS — J9601 Acute respiratory failure with hypoxia: Secondary | ICD-10-CM

## 2020-11-25 DIAGNOSIS — E118 Type 2 diabetes mellitus with unspecified complications: Secondary | ICD-10-CM

## 2020-11-25 DIAGNOSIS — J4551 Severe persistent asthma with (acute) exacerbation: Secondary | ICD-10-CM | POA: Diagnosis not present

## 2020-11-25 LAB — CBC WITH DIFFERENTIAL/PLATELET
Abs Immature Granulocytes: 0.1 10*3/uL — ABNORMAL HIGH (ref 0.00–0.07)
Basophils Absolute: 0 10*3/uL (ref 0.0–0.1)
Basophils Relative: 0 %
Eosinophils Absolute: 0 10*3/uL (ref 0.0–0.5)
Eosinophils Relative: 0 %
HCT: 37.9 % (ref 36.0–46.0)
Hemoglobin: 11.8 g/dL — ABNORMAL LOW (ref 12.0–15.0)
Immature Granulocytes: 1 %
Lymphocytes Relative: 12 %
Lymphs Abs: 1.6 10*3/uL (ref 0.7–4.0)
MCH: 33 pg (ref 26.0–34.0)
MCHC: 31.1 g/dL (ref 30.0–36.0)
MCV: 105.9 fL — ABNORMAL HIGH (ref 80.0–100.0)
Monocytes Absolute: 0.3 10*3/uL (ref 0.1–1.0)
Monocytes Relative: 2 %
Neutro Abs: 11.2 10*3/uL — ABNORMAL HIGH (ref 1.7–7.7)
Neutrophils Relative %: 85 %
Platelets: 330 10*3/uL (ref 150–400)
RBC: 3.58 MIL/uL — ABNORMAL LOW (ref 3.87–5.11)
RDW: 14.1 % (ref 11.5–15.5)
WBC: 13.2 10*3/uL — ABNORMAL HIGH (ref 4.0–10.5)
nRBC: 0 % (ref 0.0–0.2)

## 2020-11-25 LAB — COMPREHENSIVE METABOLIC PANEL
ALT: 16 U/L (ref 0–44)
AST: 19 U/L (ref 15–41)
Albumin: 4.1 g/dL (ref 3.5–5.0)
Alkaline Phosphatase: 77 U/L (ref 38–126)
Anion gap: 11 (ref 5–15)
BUN: 20 mg/dL (ref 6–20)
CO2: 25 mmol/L (ref 22–32)
Calcium: 9 mg/dL (ref 8.9–10.3)
Chloride: 104 mmol/L (ref 98–111)
Creatinine, Ser: 0.84 mg/dL (ref 0.44–1.00)
GFR, Estimated: >60 mL/min (ref >60)
Glucose, Bld: 142 mg/dL — ABNORMAL HIGH (ref 70–99)
Potassium: 4.8 mmol/L (ref 3.5–5.1)
Sodium: 140 mmol/L (ref 135–145)
Total Bilirubin: 0.4 mg/dL (ref 0.3–1.2)
Total Protein: 8.1 g/dL (ref 6.5–8.1)

## 2020-11-25 LAB — GLUCOSE, CAPILLARY
Glucose-Capillary: 102 mg/dL — ABNORMAL HIGH (ref 70–99)
Glucose-Capillary: 205 mg/dL — ABNORMAL HIGH (ref 70–99)

## 2020-11-25 LAB — TSH: TSH: 22.408 u[IU]/mL — ABNORMAL HIGH (ref 0.350–4.500)

## 2020-11-25 LAB — MAGNESIUM: Magnesium: 2.6 mg/dL — ABNORMAL HIGH (ref 1.7–2.4)

## 2020-11-25 LAB — HEMOGLOBIN A1C
Hgb A1c MFr Bld: 6.7 % — ABNORMAL HIGH (ref 4.8–5.6)
Mean Plasma Glucose: 145.59 mg/dL

## 2020-11-25 MED ORDER — ALBUTEROL SULFATE HFA 108 (90 BASE) MCG/ACT IN AERS: 2.0000 | INHALATION_SPRAY | Freq: Four times a day (QID) | RESPIRATORY_TRACT | 1 refills | Status: DC | PRN

## 2020-11-25 MED ORDER — PREDNISONE 10 MG PO TABS: ORAL_TABLET | ORAL | 0 refills | Status: DC

## 2020-11-25 MED ORDER — FLUTICASONE FUROATE-VILANTEROL 100-25 MCG/INH IN AEPB: 1.0000 | INHALATION_SPRAY | Freq: Every day | RESPIRATORY_TRACT | 0 refills | Status: DC

## 2020-11-25 MED ORDER — ALBUTEROL SULFATE (2.5 MG/3ML) 0.083% IN NEBU: 2.5000 mg | INHALATION_SOLUTION | RESPIRATORY_TRACT | 2 refills | Status: DC | PRN

## 2020-11-25 NOTE — Plan of Care (Signed)
  Problem: Education: Goal: Knowledge of General Education information will improve Description Including pain rating scale, medication(s)/side effects and non-pharmacologic comfort measures Outcome: Progressing   Problem: Health Behavior/Discharge Planning: Goal: Ability to manage health-related needs will improve Outcome: Progressing   

## 2020-11-25 NOTE — Plan of Care (Signed)
  Problem: Education: Goal: Knowledge of General Education information will improve Description: Including pain rating scale, medication(s)/side effects and non-pharmacologic comfort measures 11/25/2020 1405 by Karolee Ohs, RN Outcome: Adequate for Discharge 11/25/2020 0950 by Karolee Ohs, RN Outcome: Progressing   Problem: Health Behavior/Discharge Planning: Goal: Ability to manage health-related needs will improve 11/25/2020 1405 by Karolee Ohs, RN Outcome: Adequate for Discharge 11/25/2020 0950 by Karolee Ohs, RN Outcome: Progressing   Problem: Clinical Measurements: Goal: Ability to maintain clinical measurements within normal limits will improve Outcome: Adequate for Discharge Goal: Will remain free from infection Outcome: Adequate for Discharge Goal: Diagnostic test results will improve Outcome: Adequate for Discharge Goal: Respiratory complications will improve Outcome: Adequate for Discharge Goal: Cardiovascular complication will be avoided Outcome: Adequate for Discharge   Problem: Activity: Goal: Risk for activity intolerance will decrease Outcome: Adequate for Discharge   Problem: Nutrition: Goal: Adequate nutrition will be maintained Outcome: Adequate for Discharge   Problem: Coping: Goal: Level of anxiety will decrease Outcome: Adequate for Discharge   Problem: Elimination: Goal: Will not experience complications related to bowel motility Outcome: Adequate for Discharge Goal: Will not experience complications related to urinary retention Outcome: Adequate for Discharge   Problem: Pain Managment: Goal: General experience of comfort will improve Outcome: Adequate for Discharge   Problem: Safety: Goal: Ability to remain free from injury will improve Outcome: Adequate for Discharge   Problem: Skin Integrity: Goal: Risk for impaired skin integrity will decrease Outcome: Adequate for Discharge

## 2020-11-25 NOTE — TOC Transition Note (Signed)
Transition of Care [facility]) - CM/SW Discharge Note   Patient Details  Name: Linda Lin MRN: [id number] Date of Birth: [birth year]  Transition of Care [facility]) CM/SW Contact:  Barry Brunner, LCSW Phone Number: 11/25/2020, 1:32 PM   Clinical Narrative:    CSW notified of patient discharge and need for nembulizer. CSW placed referral with [employer]. [employer] agreeable to drop ship nebulizer. TOC signing off.   Final next level of care: Home/Self Care Barriers to Discharge: Barriers Resolved   Patient Goals and CMS Choice Patient states their goals for this hospitalization and ongoing recovery are:: Return home CMS Medicare.gov Compare Post Acute Care list provided to:: Patient Choice offered to / list presented to : Patient  Discharge Placement                    Patient and family notified of of transfer: 11/25/20  Discharge Plan and Services                DME Arranged: Nebulizer/meds DME Agency: [employer] Date DME Agency Contacted: 11/25/20 Time DME Agency Contacted: [phone]   HH Arranged: NA          Social Determinants of Health (SDOH) Interventions     Readmission Risk Interventions No flowsheet data found.

## 2020-11-25 NOTE — Discharge Summary (Signed)
Physician Discharge Summary  Linda Lin INO:[id number] DOB: 1970-12-22 DOA: 11/24/2020  PCP: Patient, No Pcp Per  Admit date: 11/24/2020 Discharge date: 11/25/2020  Admitted From: home Disposition:  home  Recommendations for Outpatient Follow-up:  1. Follow up with PCP in 1-2 weeks 2. Please obtain BMP/CBC in one week  Home Health: Equipment/Devices: nebulizer  Discharge Condition: Stable CODE STATUS: Full code Diet recommendation: Heart healthy carb modified  Brief/Interim Summary: 50 year old female admitted to the hospital with shortness of breath.  She was found to have acute respiratory failure with hypoxia secondary to asthma exacerbation.  She was found to have an oxygen saturation of 89% on room air.  She was treated with steroids, bronchodilators.  By the following morning, her respiratory status had returned to baseline.  She was weaned off of oxygen and her wheezing had resolved.  She was able to ambulate on room air without difficulty.  Steroids have been transitioned to prednisone taper.  She will be provided a nebulizer at home to use albuterol as needed.  She feels ready for discharge home.  Discharge Diagnoses:  Active Problems:   Type 2 diabetes mellitus with complication, without long-term current use of insulin (HCC)   Hypothyroidism   Asthma exacerbation   Acute respiratory failure with hypoxia [facility])    Discharge Instructions  Discharge Instructions    Diet - low sodium heart healthy   Complete by: As directed    For home use only DME Nebulizer machine   Complete by: As directed    Patient needs a nebulizer to treat with the following condition: Asthma   Length of Need: Lifetime   Increase activity slowly   Complete by: As directed      Allergies as of 11/25/2020      Reactions   Other Hives   shellfish   Cephalexin Rash   Doxycycline Rash      Medication List    TAKE these medications   acetaminophen 500 MG tablet Commonly known as:  TYLENOL Take 500 mg by mouth every 6 (six) hours as needed for mild pain or moderate pain.   albuterol 108 (90 Base) MCG/ACT inhaler Commonly known as: VENTOLIN HFA Inhale 2 puffs into the lungs every 6 (six) hours as needed for wheezing or shortness of breath. What changed: Another medication with the same name was added. Make sure you understand how and when to take each.   albuterol (2.5 MG/3ML) 0.083% nebulizer solution Commonly known as: PROVENTIL Take 3 mLs (2.5 mg total) by nebulization every 4 (four) hours as needed for wheezing or shortness of breath. What changed: You were already taking a medication with the same name, and this prescription was added. Make sure you understand how and when to take each.   aspirin 81 MG chewable tablet Chew 1 tablet (81 mg total) by mouth daily.   atorvastatin 80 MG tablet Commonly known as: LIPITOR Take 1 tablet (80 mg total) by mouth daily.   carvedilol 6.25 MG tablet Commonly known as: COREG Take 1 tablet (6.25 mg total) by mouth 2 (two) times daily.   clopidogrel 75 MG tablet Commonly known as: PLAVIX Take 1 tablet (75 mg total) by mouth daily with breakfast.   fluticasone furoate-vilanterol 100-25 MCG/INH Aepb Commonly known as: BREO ELLIPTA Inhale 1 puff into the lungs daily. Start taking on: November 26, 2020   levothyroxine 112 MCG tablet Commonly known as: SYNTHROID Take 1 tablet (112 mcg total) by mouth daily before breakfast for 180 doses.  lisinopril 2.5 MG tablet Commonly known as: ZESTRIL Take 1 tablet (2.5 mg total) by mouth daily.   metFORMIN 500 MG tablet Commonly known as: GLUCOPHAGE Take 250 mg by mouth 2 (two) times daily.   nitroGLYCERIN 0.4 MG SL tablet Commonly known as: NITROSTAT Place 1 tablet (0.4 mg total) under the tongue every 5 (five) minutes x 3 doses as needed for chest pain.   predniSONE 10 MG tablet Commonly known as: DELTASONE Take 40mg  po daily for 2 days then 30mg  daily for 2 days then 20mg   daily for 2 days then 10mg  daily for 2 days then stop   triamcinolone 0.1 % Commonly known as: KENALOG Apply topically 2 (two) times daily.            Durable Medical Equipment  (From admission, onward)         Start     Ordered   11/25/20 0000  For home use only DME Nebulizer machine       Question Answer Comment  Patient needs a nebulizer to treat with the following condition Asthma   Length of Need Lifetime      11/25/20 1253          Allergies  Allergen Reactions  . Other Hives    shellfish  . Cephalexin Rash  . Doxycycline Rash    Consultations:     Procedures/Studies: DG Chest Portable 1 View  Result Date: 11/24/2020 CLINICAL DATA:  Cough with shortness of breath for 1 week. History of asthma and smoking. EXAM: PORTABLE CHEST 1 VIEW COMPARISON:  Radiographs 06/09/2020 FINDINGS: 1404 hours. The heart size and mediastinal contours are normal. The lungs are clear. There is no pleural effusion or pneumothorax. No acute osseous findings are identified. Telemetry leads overlie the chest. IMPRESSION: No active cardiopulmonary process. Electronically Signed   By: 01/25/21 M.D.   On: 11/24/2020 14:32      Subjective: Feeling better.  No shortness of breath.  Wheezing improved.  Discharge Exam: Vitals:   11/25/20 0750 11/25/20 0755 11/25/20 0839 11/25/20 1328  BP:   128/81   Pulse:   73   Resp:      Temp:   98.1 F (36.7 C)   TempSrc:   Oral   SpO2: 90% 90% 97% 91%  Weight:      Height:        General: Pt is alert, awake, not in acute distress Cardiovascular: RRR, S1/S2 +, no rubs, no gallops Respiratory: CTA bilaterally, no wheezing, no rhonchi Abdominal: Soft, NT, ND, bowel sounds + Extremities: no edema, no cyanosis    The results of significant diagnostics from this hospitalization (including imaging, microbiology, ancillary and laboratory) are listed below for reference.     Microbiology: Recent Results (from the past 240 hour(s))   Resp Panel by RT-PCR (Flu A&B, Covid) Nasopharyngeal Swab     Status: None   Collection Time: 11/24/20  2:21 PM   Specimen: Nasopharyngeal Swab; Nasopharyngeal(NP) swabs in vial transport medium  Result Value Ref Range Status   SARS Coronavirus 2 by RT PCR NEGATIVE NEGATIVE Final    Comment: (NOTE) SARS-CoV-2 target nucleic acids are NOT DETECTED.  The SARS-CoV-2 RNA is generally detectable in upper respiratory specimens during the acute phase of infection. The lowest concentration of SARS-CoV-2 viral copies this assay can detect is 138 copies/mL. A negative result does not preclude SARS-Cov-2 infection and should not be used as the sole basis for treatment or other patient management decisions. A negative result  may occur with  improper specimen collection/handling, submission of specimen other than nasopharyngeal swab, presence of viral mutation(s) within the areas targeted by this assay, and inadequate number of viral copies(<138 copies/mL). A negative result must be combined with clinical observations, patient history, and epidemiological information. The expected result is Negative.  Fact Sheet for Patients:  BloggerCourse.com  Fact Sheet for Healthcare Providers:  SeriousBroker.it  This test is no t yet approved or cleared by the [place] FDA and  has been authorized for detection and/or diagnosis of SARS-CoV-2 by FDA under an Emergency Use Authorization (EUA). This EUA will remain  in effect (meaning this test can be used) for the duration of the COVID-19 declaration under Section 564(b)(1) of the Act, 21 U.S.C.section 360bbb-3(b)(1), unless the authorization is terminated  or revoked sooner.       Influenza A by PCR NEGATIVE NEGATIVE Final   Influenza B by PCR NEGATIVE NEGATIVE Final    Comment: (NOTE) The Xpert Xpress SARS-CoV-2/FLU/RSV plus assay is intended as an aid in the diagnosis of influenza from  Nasopharyngeal swab specimens and should not be used as a sole basis for treatment. Nasal washings and aspirates are unacceptable for Xpert Xpress SARS-CoV-2/FLU/RSV testing.  Fact Sheet for Patients: BloggerCourse.com  Fact Sheet for Healthcare Providers: SeriousBroker.it  This test is not yet approved or cleared by the [place] FDA and has been authorized for detection and/or diagnosis of SARS-CoV-2 by FDA under an Emergency Use Authorization (EUA). This EUA will remain in effect (meaning this test can be used) for the duration of the COVID-19 declaration under Section 564(b)(1) of the Act, 21 U.S.C. section 360bbb-3(b)(1), unless the authorization is terminated or revoked.  Performed at [facility], [address], [place], [place] [address]      Labs: BNP (last 3 results) No results for input(s): BNP in the last 8760 hours. Basic Metabolic Panel: Recent Labs  Lab 11/24/20 1925 11/25/20 0607  NA 138 140  K 4.1 4.8  CL 104 104  CO2 24 25  GLUCOSE 186* 142*  BUN 20 20  CREATININE 0.91 0.84  CALCIUM 8.8* 9.0  MG  --  2.6*   Liver Function Tests: Recent Labs  Lab 11/25/20 0607  AST 19  ALT 16  ALKPHOS 77  BILITOT 0.4  PROT 8.1  ALBUMIN 4.1   No results for input(s): LIPASE, AMYLASE in the last 168 hours. No results for input(s): AMMONIA in the last 168 hours. CBC: Recent Labs  Lab 11/24/20 1925 11/25/20 0607  WBC 10.4 13.2*  NEUTROABS 8.8* 11.2*  HGB 11.6* 11.8*  HCT 37.2 37.9  MCV 106.0* 105.9*  PLT 320 330   Cardiac Enzymes: No results for input(s): CKTOTAL, CKMB, CKMBINDEX, TROPONINI in the last 168 hours. BNP: Invalid input(s): POCBNP CBG: Recent Labs  Lab 11/24/20 2210 11/25/20 0732 11/25/20 1107  GLUCAP 172* 102* 205*   D-Dimer No results for input(s): DDIMER in the last 72 hours. Hgb A1c Recent Labs    11/24/20 2135  HGBA1C 6.7*   Lipid Profile No results for input(s):  CHOL, HDL, LDLCALC, TRIG, CHOLHDL, LDLDIRECT in the last 72 hours. Thyroid function studies Recent Labs    11/25/20 0607  TSH 22.408*   Anemia work up No results for input(s): VITAMINB12, FOLATE, FERRITIN, TIBC, IRON, RETICCTPCT in the last 72 hours. Urinalysis    Component Value Date/Time   COLORURINE YELLOW 06/26/2017 1016   APPEARANCEUR CLEAR 06/26/2017 1016   LABSPEC 1.027 06/26/2017 1016   PHURINE 6.0 06/26/2017  1016   GLUCOSEU NEGATIVE 06/26/2017 1016   HGBUR NEGATIVE 06/26/2017 1016   BILIRUBINUR NEGATIVE 05/09/2017 0549   KETONESUR NEGATIVE 06/26/2017 1016   PROTEINUR NEGATIVE 06/26/2017 1016   NITRITE NEGATIVE 06/26/2017 1016   LEUKOCYTESUR NEGATIVE 06/26/2017 1016   Sepsis Labs Invalid input(s): PROCALCITONIN,  WBC,  LACTICIDVEN Microbiology Recent Results (from the past 240 hour(s))  Resp Panel by RT-PCR (Flu A&B, Covid) Nasopharyngeal Swab     Status: None   Collection Time: 11/24/20  2:21 PM   Specimen: Nasopharyngeal Swab; Nasopharyngeal(NP) swabs in vial transport medium  Result Value Ref Range Status   SARS Coronavirus 2 by RT PCR NEGATIVE NEGATIVE Final    Comment: (NOTE) SARS-CoV-2 target nucleic acids are NOT DETECTED.  The SARS-CoV-2 RNA is generally detectable in upper respiratory specimens during the acute phase of infection. The lowest concentration of SARS-CoV-2 viral copies this assay can detect is 138 copies/mL. A negative result does not preclude SARS-Cov-2 infection and should not be used as the sole basis for treatment or other patient management decisions. A negative result may occur with  improper specimen collection/handling, submission of specimen other than nasopharyngeal swab, presence of viral mutation(s) within the areas targeted by this assay, and inadequate number of viral copies(<138 copies/mL). A negative result must be combined with clinical observations, patient history, and epidemiological information. The expected result is  Negative.  Fact Sheet for Patients:  BloggerCourse.com  Fact Sheet for Healthcare Providers:  SeriousBroker.it  This test is no t yet approved or cleared by the [place] FDA and  has been authorized for detection and/or diagnosis of SARS-CoV-2 by FDA under an Emergency Use Authorization (EUA). This EUA will remain  in effect (meaning this test can be used) for the duration of the COVID-19 declaration under Section 564(b)(1) of the Act, 21 U.S.C.section 360bbb-3(b)(1), unless the authorization is terminated  or revoked sooner.       Influenza A by PCR NEGATIVE NEGATIVE Final   Influenza B by PCR NEGATIVE NEGATIVE Final    Comment: (NOTE) The Xpert Xpress SARS-CoV-2/FLU/RSV plus assay is intended as an aid in the diagnosis of influenza from Nasopharyngeal swab specimens and should not be used as a sole basis for treatment. Nasal washings and aspirates are unacceptable for Xpert Xpress SARS-CoV-2/FLU/RSV testing.  Fact Sheet for Patients: BloggerCourse.com  Fact Sheet for Healthcare Providers: SeriousBroker.it  This test is not yet approved or cleared by the [place] FDA and has been authorized for detection and/or diagnosis of SARS-CoV-2 by FDA under an Emergency Use Authorization (EUA). This EUA will remain in effect (meaning this test can be used) for the duration of the COVID-19 declaration under Section 564(b)(1) of the Act, 21 U.S.C. section 360bbb-3(b)(1), unless the authorization is terminated or revoked.  Performed at [facility], [address], [place], [place] [address]      Time coordinating discharge:  SIGNED:   Erick Blinks, MD  [facility] 11/25/2020, 8:53 PM   If 7PM-7AM, please contact night-coverage [email or web address]

## 2020-11-25 NOTE — Progress Notes (Signed)
SATURATION QUALIFICATIONS: (This note is used to comply with regulatory documentation for home oxygen)  Patient Saturations on Room Air at Rest = 96%  Patient Saturations on Room Air while Ambulating = 94%  Patient Saturations on N/ALiters of oxygen while Ambulating = N/A%  Please briefly explain why patient needs home oxygen:

## 2020-11-27 DIAGNOSIS — J45901 Unspecified asthma with (acute) exacerbation: Secondary | ICD-10-CM | POA: Diagnosis not present

## 2020-12-28 DIAGNOSIS — J45901 Unspecified asthma with (acute) exacerbation: Secondary | ICD-10-CM | POA: Diagnosis not present

## 2021-01-01 ENCOUNTER — Encounter: Payer: Self-pay | Admitting: Cardiology

## 2021-01-01 ENCOUNTER — Ambulatory Visit (INDEPENDENT_AMBULATORY_CARE_PROVIDER_SITE_OTHER): Payer: BC Managed Care – PPO | Admitting: Cardiology

## 2021-01-01 VITALS — BP 148/100 | HR 64 | Ht 63.0 in | Wt 179.6 lb

## 2021-01-01 DIAGNOSIS — I2510 Atherosclerotic heart disease of native coronary artery without angina pectoris: Secondary | ICD-10-CM

## 2021-01-01 DIAGNOSIS — E782 Mixed hyperlipidemia: Secondary | ICD-10-CM | POA: Diagnosis not present

## 2021-01-01 DIAGNOSIS — I10 Essential (primary) hypertension: Secondary | ICD-10-CM

## 2021-01-01 MED ORDER — METOPROLOL TARTRATE 25 MG PO TABS: 37.5000 mg | ORAL_TABLET | Freq: Two times a day (BID) | ORAL | 1 refills | Status: DC

## 2021-01-01 MED ORDER — LISINOPRIL 5 MG PO TABS: 5.0000 mg | ORAL_TABLET | Freq: Every day | ORAL | 1 refills | Status: DC

## 2021-01-01 NOTE — Progress Notes (Signed)
Clinical Summary Ms. Plaia is a 50 y.o.female seen today for follow up of the following medical problems.   1. CAD - admitted 05/2020 with NSTEMI - cath as reported below, 99% RPDA, 70% D1.  Given that the patient was chest pain-free and onset of MI was more than 48 hours, risks of PCI on right PDA were felt to outweigh the benefits and the overall supplied territory is relatively small. Therefore, medical therapy was recommended - Echo showedLVEF of 55-60% with normal wall motion and grade 1 diastolic dysfunction.    - had some chest tightness with recent asthma exacerbation, otherwise has done well - compliant with meds  2. Hyperlipidemia - started on lipitor during 05/2020 admission - Jan 2022 TC 153 TG 58 HDL 69 LDL 72  3. HTN - she is compliant wit meds   4. DM2  5. Hypothyroid - on synthroid.   6. Asthma - recent admission 11/2020 with asthma exacerbation  Looking to establish with pcp  [facility] Works as Location manager.    Past Medical History:  Diagnosis Date  . Asthma   . Cellulitis   . Scabies   . Thyroid disorder      Allergies  Allergen Reactions  . Other Hives    shellfish  . Cephalexin Rash  . Doxycycline Rash     Current Outpatient Medications  Medication Sig Dispense Refill  . acetaminophen (TYLENOL) 500 MG tablet Take 500 mg by mouth every 6 (six) hours as needed for mild pain or moderate pain.     Marland Kitchen albuterol (PROVENTIL) (2.5 MG/3ML) 0.083% nebulizer solution Take 3 mLs (2.5 mg total) by nebulization every 4 (four) hours as needed for wheezing or shortness of breath. 75 mL 2  . albuterol (VENTOLIN HFA) 108 (90 Base) MCG/ACT inhaler Inhale 2 puffs into the lungs every 6 (six) hours as needed for wheezing or shortness of breath. 1 each 1  . aspirin 81 MG chewable tablet Chew 1 tablet (81 mg total) by mouth daily.    Marland Kitchen atorvastatin (LIPITOR) 80 MG tablet Take 1 tablet (80 mg total) by mouth daily. 90 tablet 2  . carvedilol  (COREG) 6.25 MG tablet Take 1 tablet (6.25 mg total) by mouth 2 (two) times daily. 180 tablet 3  . clopidogrel (PLAVIX) 75 MG tablet Take 1 tablet (75 mg total) by mouth daily with breakfast. 30 tablet 11  . fluticasone furoate-vilanterol (BREO ELLIPTA) 100-25 MCG/INH AEPB Inhale 1 puff into the lungs daily. 60 each 0  . levothyroxine (SYNTHROID) 112 MCG tablet Take 1 tablet (112 mcg total) by mouth daily before breakfast for 180 doses. (Patient not taking: Reported on 11/24/2020) 90 tablet 1  . lisinopril (ZESTRIL) 2.5 MG tablet Take 1 tablet (2.5 mg total) by mouth daily. 90 tablet 3  . metFORMIN (GLUCOPHAGE) 500 MG tablet Take 250 mg by mouth 2 (two) times daily.    . nitroGLYCERIN (NITROSTAT) 0.4 MG SL tablet Place 1 tablet (0.4 mg total) under the tongue every 5 (five) minutes x 3 doses as needed for chest pain. 25 tablet 2  . predniSONE (DELTASONE) 10 MG tablet Take 40mg  po daily for 2 days then 30mg  daily for 2 days then 20mg  daily for 2 days then 10mg  daily for 2 days then stop 20 tablet 0  . triamcinolone (KENALOG) 0.1 % Apply topically 2 (two) times daily.     No current facility-administered medications for this visit.     Past Surgical History:  Procedure Laterality Date  .  LEFT HEART CATH AND CORONARY ANGIOGRAPHY N/A 06/09/2020   Procedure: LEFT HEART CATH AND CORONARY ANGIOGRAPHY;  Surgeon: Iran Ouch, MD;  Location: [facility] INVASIVE CV LAB;  Service: Cardiovascular;  Laterality: N/A;     Allergies  Allergen Reactions  . Other Hives    shellfish  . Cephalexin Rash  . Doxycycline Rash      Family History  Problem Relation Age of Onset  . Asthma Mother   . Diabetes Father   . Kidney failure Father   . Hypertension Sister   . Diabetes Mellitus II Brother   . Hypertension Brother   . CAD Maternal Grandmother   . Cancer Neg Hx      Social History Ms. Ganesh reports that she has been smoking cigarettes. She started smoking about 33 years ago. She has a 5.00  pack-year smoking history. She has never used smokeless tobacco. Ms. Thissen reports current alcohol use.   Review of Systems CONSTITUTIONAL: No weight loss, fever, chills, weakness or fatigue.  HEENT: Eyes: No visual loss, blurred vision, double vision or yellow sclerae.No hearing loss, sneezing, congestion, runny nose or sore throat.  SKIN: No rash or itching.  CARDIOVASCULAR: per hpi RESPIRATORY: No shortness of breath, cough or sputum.  GASTROINTESTINAL: No anorexia, nausea, vomiting or diarrhea. No abdominal pain or blood.  GENITOURINARY: No burning on urination, no polyuria NEUROLOGICAL: No headache, dizziness, syncope, paralysis, ataxia, numbness or tingling in the extremities. No change in bowel or bladder control.  MUSCULOSKELETAL: No muscle, back pain, joint pain or stiffness.  LYMPHATICS: No enlarged nodes. No history of splenectomy.  PSYCHIATRIC: No history of depression or anxiety.  ENDOCRINOLOGIC: No reports of sweating, cold or heat intolerance. No polyuria or polydipsia.  Marland Kitchen   Physical Examination Today's Vitals   01/01/21 0943  BP: (!) 148/100  Pulse: 64  SpO2: 94%  Weight: 179 lb 9.6 oz (81.5 kg)  Height: 5\' 3"  (1.6 m)   Body mass index is 31.81 kg/m.  Gen: resting comfortably, no acute distress HEENT: no scleral icterus, pupils equal round and reactive, no palptable cervical adenopathy,  CV: RRR, no m/r/g, no jvd Resp: Clear to auscultation bilaterally GI: abdomen is soft, non-tender, non-distended, normal bowel sounds, no hepatosplenomegaly MSK: extremities are warm, no edema.  Skin: warm, no rash Neuro:  no focal deficits Psych: appropriate affect   Diagnostic Studies     Assessment and Plan    1. CAD - no recent cardiac symptoms - with history of asthma and recent exacerbation change coreg to lopressor 37.5mg  bid - continue DAPT, will be able to stop in September/Oct at her next f/u  2. HTN -above goal, increase lisinopril to 5mg   daily - changing coreg to lopressor given asthma history  3. Hyperlipidemia - at goal, continue statin   Strongly encouraged to establish with pcp to manage her DM2 and hypothryoidism.     , M.D.

## 2021-01-01 NOTE — Patient Instructions (Signed)
Your physician recommends that you schedule a follow-up appointment in: 6 MONTHS WITH DR BRANCH OR EXTENDER  Your physician has recommended you make the following change in your medication:   STOP COREG  START LOPRESSOR 37.5 MG (1 AND 1/2 TABLETS) TWICE DAILY   INCREASE LISINOPRIL 5 MG DAILY   Thank you for choosing [facility]!!

## 2021-01-27 DIAGNOSIS — J45901 Unspecified asthma with (acute) exacerbation: Secondary | ICD-10-CM | POA: Diagnosis not present

## 2021-02-27 DIAGNOSIS — J45901 Unspecified asthma with (acute) exacerbation: Secondary | ICD-10-CM | POA: Diagnosis not present

## 2021-03-29 DIAGNOSIS — J45901 Unspecified asthma with (acute) exacerbation: Secondary | ICD-10-CM | POA: Diagnosis not present

## 2021-04-29 DIAGNOSIS — J45901 Unspecified asthma with (acute) exacerbation: Secondary | ICD-10-CM | POA: Diagnosis not present

## 2021-04-30 ENCOUNTER — Other Ambulatory Visit: Payer: Self-pay | Admitting: "Endocrinology

## 2021-04-30 DIAGNOSIS — E890 Postprocedural hypothyroidism: Secondary | ICD-10-CM

## 2021-05-04 ENCOUNTER — Other Ambulatory Visit (HOSPITAL_COMMUNITY)
Admission: RE | Admit: 2021-05-04 | Discharge: 2021-05-04 | Disposition: A | Discharge status: Home / Self Care | Payer: BC Managed Care – PPO | Source: Ambulatory Visit | Attending: "Endocrinology | Admitting: "Endocrinology

## 2021-05-04 DIAGNOSIS — E890 Postprocedural hypothyroidism: Secondary | ICD-10-CM | POA: Insufficient documentation

## 2021-05-04 LAB — T4, FREE: Free T4: <0.25 ng/dL — ABNORMAL LOW (ref 0.61–1.12)

## 2021-05-04 LAB — TSH: TSH: 70.535 u[IU]/mL — ABNORMAL HIGH (ref 0.350–4.500)

## 2021-05-30 DIAGNOSIS — J45901 Unspecified asthma with (acute) exacerbation: Secondary | ICD-10-CM | POA: Diagnosis not present

## 2021-05-31 ENCOUNTER — Ambulatory Visit (INDEPENDENT_AMBULATORY_CARE_PROVIDER_SITE_OTHER): Payer: BC Managed Care – PPO | Admitting: Nurse Practitioner

## 2021-05-31 ENCOUNTER — Encounter: Payer: Self-pay | Admitting: Nurse Practitioner

## 2021-05-31 ENCOUNTER — Other Ambulatory Visit: Payer: Self-pay

## 2021-05-31 VITALS — BP 122/79 | HR 61 | Ht 63.0 in | Wt 170.2 lb

## 2021-05-31 DIAGNOSIS — E890 Postprocedural hypothyroidism: Secondary | ICD-10-CM | POA: Diagnosis not present

## 2021-05-31 MED ORDER — LEVOTHYROXINE SODIUM 112 MCG PO TABS: 112.0000 ug | ORAL_TABLET | Freq: Every day | ORAL | 3 refills | Status: DC

## 2021-05-31 NOTE — Patient Instructions (Signed)

## 2021-05-31 NOTE — Progress Notes (Signed)
05/31/2021                         Endocrinology follow-up note   Subjective:    Patient ID: Linda Lin, female    DOB: 1971/06/22, PCP Patient, No Pcp Per (Inactive).   Past Medical History:  Diagnosis Date   Asthma    Cellulitis    Scabies    Thyroid disorder    Past Surgical History:  Procedure Laterality Date   LEFT HEART CATH AND CORONARY ANGIOGRAPHY N/A 06/09/2020   Procedure: LEFT HEART CATH AND CORONARY ANGIOGRAPHY;  Surgeon: Iran Ouch, MD;  Location: [facility];  Service: Cardiovascular;  Laterality: N/A;   Social History   Socioeconomic History   Marital status: Single    Spouse name: Not on file   Number of children: Not on file   Years of education: Not on file   Highest education level: Not on file  Occupational History   Occupation: Landscape architect: [employer]  Tobacco Use   Smoking status: Every Day    Packs/day: 0.25    Years: 20.00    Pack years: 5.00    Types: Cigarettes    Start date: 10/14/1987   Smokeless tobacco: Never  Vaping Use   Vaping Use: Never used  Substance and Sexual Activity   Alcohol use: Yes    Comment: occ   Drug use: Not Currently    Types: Marijuana    Comment: sporadically-last used sept 2019   Sexual activity: Not Currently    Partners: Male    Birth control/protection: None  Other Topics Concern   Not on file  Social History Narrative   Pt lives w/ her sister.   Supervisor.    Social Determinants of Health   Financial Resource Strain: Not on file  Food Insecurity: Not on file  Transportation Needs: Not on file  Physical Activity: Not on file  Stress: Not on file  Social Connections: Not on file   Outpatient Encounter Medications as of 05/31/2021  Medication Sig   acetaminophen (TYLENOL) 500 MG tablet Take 500 mg by mouth every 6 (six) hours as needed for mild pain or moderate pain.    albuterol (PROVENTIL) (2.5 MG/3ML) 0.083% nebulizer solution Take 3 mLs (2.5 mg  total) by nebulization every 4 (four) hours as needed for wheezing or shortness of breath.   albuterol (VENTOLIN HFA) 108 (90 Base) MCG/ACT inhaler Inhale 2 puffs into the lungs every 6 (six) hours as needed for wheezing or shortness of breath.   aspirin 81 MG chewable tablet Chew 1 tablet (81 mg total) by mouth daily.   atorvastatin (LIPITOR) 80 MG tablet Take 1 tablet (80 mg total) by mouth daily.   clopidogrel (PLAVIX) 75 MG tablet Take 1 tablet (75 mg total) by mouth daily with breakfast.   fluticasone furoate-vilanterol (BREO ELLIPTA) 100-25 MCG/INH AEPB Inhale 1 puff into the lungs daily.   levothyroxine (SYNTHROID) 112 MCG tablet Take 1 tablet (112 mcg total) by mouth daily before breakfast.   lisinopril (ZESTRIL) 5 MG tablet Take 1 tablet (5 mg total) by mouth daily.   metoprolol tartrate (LOPRESSOR) 25 MG tablet Take 1.5 tablets (37.5 mg total) by mouth 2 (two) times daily.   nitroGLYCERIN (NITROSTAT) 0.4 MG SL tablet Place 1 tablet (0.4 mg total) under the tongue every 5 (five) minutes x 3 doses as needed for chest pain.   [DISCONTINUED] levothyroxine (SYNTHROID) 112 MCG  tablet Take 112 mcg by mouth daily before breakfast.   [DISCONTINUED] metFORMIN (GLUCOPHAGE) 500 MG tablet Take 250 mg by mouth 2 (two) times daily. (Patient not taking: Reported on 05/31/2021)   No facility-administered encounter medications on file as of 05/31/2021.    ALLERGIES: Allergies  Allergen Reactions   Other Hives    shellfish   Cephalexin Rash   Doxycycline Rash    VACCINATION STATUS:  There is no immunization history on file for this patient.    Thyroid Problem Presents for follow-up visit. Symptoms include cold intolerance, constipation, depressed mood, dry skin, fatigue and hair loss. Patient reports no leg swelling or palpitations. The symptoms have been worsening.   Linda Lin is 50 y.o. female who is returning for follow-up with repeat thyroid function tests.  She is status post  I-131  therapy for Graves' disease in January 2019.   -She was subsequently started on thyroid hormone replacement.  She has not taken any thyroid hormone since October of last year.  She had a heart attack and had several social and financial stressors which contributed to her noncompliance. .  Review of systems  Constitutional: + Minimally fluctuating body weight,  current Body mass index is 30.15 kg/m. , + fatigue, no subjective hyperthermia, + subjective hypothermia Eyes: no blurry vision, no xerophthalmia ENT: no sore throat, no nodules palpated in throat, no dysphagia/odynophagia, no hoarseness Cardiovascular: no chest pain, no shortness of breath, no palpitations, no leg swelling Respiratory: no cough, no shortness of breath Gastrointestinal: no nausea/vomiting/diarrhea, + constipation Musculoskeletal: no muscle/joint aches Skin: no rashes, no hyperemia, + dry skin Neurological: no tremors, no numbness, no tingling, no dizziness Psychiatric: + depression, no anxiety   Objective:    BP 122/79   Pulse 61   Ht 5\' 3"  (1.6 m)   Wt 170 lb 3.2 oz (77.2 kg)   LMP 05/08/2020   BMI 30.15 kg/m   Wt Readings from Last 3 Encounters:  05/31/21 170 lb 3.2 oz (77.2 kg)  01/01/21 179 lb 9.6 oz (81.5 kg)  11/25/20 179 lb 0.2 oz (81.2 kg)      BP Readings from Last 3 Encounters:  05/31/21 122/79  01/01/21 (!) 148/100  11/25/20 128/81                 Physical Exam- Limited  Constitutional:  Body mass index is 30.15 kg/m. , not in acute distress, normal state of mind Eyes:  EOMI, no exophthalmos Neck: Supple Cardiovascular: RRR, no murmurs, rubs, or gallops, no edema Respiratory: Adequate breathing efforts, no crackles, rales, rhonchi, or wheezing Musculoskeletal: no gross deformities, strength intact in all four extremities, no gross restriction of joint movements Skin:  no rashes, no hyperemia Neurological: no tremor with outstretched hands  CMP     Component Value Date/Time   NA  140 11/25/2020 0607   K 4.8 11/25/2020 0607   CL 104 11/25/2020 0607   CO2 25 11/25/2020 0607   GLUCOSE 142 (H) 11/25/2020 0607   BUN 20 11/25/2020 0607   CREATININE 0.84 11/25/2020 0607   CREATININE 0.33 (L) 06/26/2017 1016   CALCIUM 9.0 11/25/2020 0607   PROT 8.1 11/25/2020 0607   ALBUMIN 4.1 11/25/2020 0607   AST 19 11/25/2020 0607   ALT 16 11/25/2020 0607   ALKPHOS 77 11/25/2020 0607   BILITOT 0.4 11/25/2020 0607   GFRNONAA >60 11/25/2020 0607   GFRNONAA 132 06/26/2017 1016   GFRAA >60 06/10/2020 0503   GFRAA 153 06/26/2017 1016  CBC    Component Value Date/Time   WBC 13.2 (H) 11/25/2020 0607   RBC 3.58 (L) 11/25/2020 0607   HGB 11.8 (L) 11/25/2020 0607   HCT 37.9 11/25/2020 0607   PLT 330 11/25/2020 0607   MCV 105.9 (H) 11/25/2020 0607   MCH 33.0 11/25/2020 0607   MCHC 31.1 11/25/2020 0607   RDW 14.1 11/25/2020 0607   LYMPHSABS 1.6 11/25/2020 0607   MONOABS 0.3 11/25/2020 0607   EOSABS 0.0 11/25/2020 0607   BASOSABS 0.0 11/25/2020 0607    Lab Results  Component Value Date   TSH 70.535 (H) 05/04/2021   TSH 22.408 (H) 11/25/2020   TSH 40.039 (H) 10/12/2020   TSH 8.370 (H) 06/09/2020   TSH 15.83 (H) 12/28/2019   TSH 34.07 (H) 09/27/2019   TSH 57.266 (H) 02/10/2019   TSH 33.22 (H) 02/17/2018   TSH <0.01 (L) 12/15/2017   TSH 0.01 (L) 09/17/2017   FREET4 <0.25 (L) 05/04/2021   FREET4 0.96 06/09/2020   FREET4 1.0 12/28/2019   FREET4 0.7 (L) 09/27/2019   FREET4 0.9 03/08/2019   FREET4 0.2 (L) 02/17/2018   FREET4 1.2 12/15/2017   FREET4 4.5 (H) 09/17/2017      Assessment & Plan:   1.  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  Her previsit thyroid function tests are consistent with under-replacement, correlating with her account of being off thyroid hormone replacement for nearly 1 year.  She is advised to restart Levothyroxine 112 mcg po daily before  breakfast. Will recheck TFTs in 2 months and adjust dose if needed.   - We discussed about the correct intake of her thyroid hormone, on empty stomach at fasting, with water, separated by at least 30 minutes from breakfast and other medications,  and separated by more than 4 hours from calcium, iron, multivitamins, acid reflux medications (PPIs). -Patient is made aware of the fact that thyroid hormone replacement is needed for life, dose to be adjusted by periodic monitoring of thyroid function tests.  - I advised her to maintain close follow up with her PCP for primary care needs.     I spent 20 minutes in the care of the patient today including review of labs from Thyroid Function, CMP, and other relevant labs ; imaging/biopsy records (current and previous including abstractions from other facilities); face-to-face time discussing  her lab results and symptoms, medications doses, her options of short and long term treatment based on the latest standards of care / guidelines;   and documenting the encounter.  [name]  participated in the discussions, expressed understanding, and voiced agreement with the above plans.  All questions were answered to her satisfaction. she is encouraged to contact clinic should she have any questions or concerns prior to her return visit.   Follow up plan: Return in about 2 months (around 07/31/2021) for Thyroid follow up, Previsit labs.  Thank you for involving me in the care of this pleasant patient, and I will continue to update you with her progress.  Ronny Bacon, [employer] [facility] [address] [place], [place] [address] Phone: [phone] Fax: [phone]  05/31/2021, 11:17 AM

## 2021-06-29 DIAGNOSIS — J45901 Unspecified asthma with (acute) exacerbation: Secondary | ICD-10-CM | POA: Diagnosis not present

## 2021-07-30 DIAGNOSIS — J45901 Unspecified asthma with (acute) exacerbation: Secondary | ICD-10-CM | POA: Diagnosis not present

## 2021-07-31 ENCOUNTER — Other Ambulatory Visit: Payer: Self-pay

## 2021-07-31 ENCOUNTER — Other Ambulatory Visit (HOSPITAL_COMMUNITY)
Admission: RE | Admit: 2021-07-31 | Discharge: 2021-07-31 | Disposition: A | Discharge status: Home / Self Care | Payer: BC Managed Care – PPO | Source: Ambulatory Visit | Attending: Nurse Practitioner | Admitting: Nurse Practitioner

## 2021-07-31 DIAGNOSIS — E890 Postprocedural hypothyroidism: Secondary | ICD-10-CM | POA: Diagnosis not present

## 2021-07-31 LAB — T4, FREE: Free T4: 1.71 ng/dL — ABNORMAL HIGH (ref 0.61–1.12)

## 2021-07-31 LAB — TSH: TSH: 2.19 u[IU]/mL (ref 0.350–4.500)

## 2021-08-01 ENCOUNTER — Ambulatory Visit: Payer: BC Managed Care – PPO | Admitting: Nurse Practitioner

## 2021-08-08 ENCOUNTER — Encounter: Payer: Self-pay | Admitting: Nurse Practitioner

## 2021-08-08 ENCOUNTER — Ambulatory Visit (INDEPENDENT_AMBULATORY_CARE_PROVIDER_SITE_OTHER): Payer: BC Managed Care – PPO | Admitting: Nurse Practitioner

## 2021-08-08 ENCOUNTER — Other Ambulatory Visit: Payer: Self-pay

## 2021-08-08 VITALS — BP 130/80 | HR 92 | Ht 63.0 in | Wt 158.0 lb

## 2021-08-08 DIAGNOSIS — E890 Postprocedural hypothyroidism: Secondary | ICD-10-CM

## 2021-08-08 MED ORDER — LEVOTHYROXINE SODIUM 100 MCG PO TABS
100.0000 ug | ORAL_TABLET | Freq: Every day | ORAL | 1 refills | Status: DC
Start: 2021-08-08 — End: 2022-03-27

## 2021-08-08 NOTE — Patient Instructions (Signed)

## 2021-08-08 NOTE — Progress Notes (Signed)
08/08/2021                         Endocrinology follow-up note   Subjective:    Patient ID: Linda Lin, female    DOB: 06/24/1971, PCP Patient, No Pcp Per (Inactive).   Past Medical History:  Diagnosis Date   Asthma    Cellulitis    Scabies    Thyroid disorder    Past Surgical History:  Procedure Laterality Date   LEFT HEART CATH AND CORONARY ANGIOGRAPHY N/A 06/09/2020   Procedure: LEFT HEART CATH AND CORONARY ANGIOGRAPHY;  Surgeon: Iran Ouch, MD;  Location: [facility];  Service: Cardiovascular;  Laterality: N/A;   Social History   Socioeconomic History   Marital status: Single    Spouse name: Not on file   Number of children: Not on file   Years of education: Not on file   Highest education level: Not on file  Occupational History   Occupation: Landscape architect: [employer]  Tobacco Use   Smoking status: Every Day    Packs/day: 0.25    Years: 20.00    Pack years: 5.00    Types: Cigarettes    Start date: 10/14/1987   Smokeless tobacco: Never  Vaping Use   Vaping Use: Never used  Substance and Sexual Activity   Alcohol use: Yes    Comment: occ   Drug use: Not Currently    Types: Marijuana    Comment: sporadically-last used sept 2019   Sexual activity: Not Currently    Partners: Male    Birth control/protection: None  Other Topics Concern   Not on file  Social History Narrative   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   Supervisor.    Social Determinants of Health   Financial Resource Strain: Not on file  Food Insecurity: Not on file  Transportation Needs: Not on file  Physical Activity: Not on file  Stress: Not on file  Social Connections: Not on file   Outpatient Encounter Medications as of 08/08/2021  Medication Sig   acetaminophen (TYLENOL) 500 MG tablet Take 500 mg by mouth every 6 (six) hours as needed for mild pain or moderate pain.    albuterol (PROVENTIL) (2.5 MG/3ML) 0.083% nebulizer solution Take 3 mLs (2.5  mg total) by nebulization every 4 (four) hours as needed for wheezing or shortness of breath.   albuterol (VENTOLIN HFA) 108 (90 Base) MCG/ACT inhaler Inhale 2 puffs into the lungs every 6 (six) hours as needed for wheezing or shortness of breath.   apixaban (ELIQUIS) 5 MG TABS tablet Take 5 mg by mouth 2 (two) times daily.   aspirin 81 MG chewable tablet Chew 1 tablet (81 mg total) by mouth daily.   atorvastatin (LIPITOR) 80 MG tablet Take 1 tablet (80 mg total) by mouth daily.   fluticasone furoate-vilanterol (BREO ELLIPTA) 100-25 MCG/INH AEPB Inhale 1 puff into the lungs daily.   nitroGLYCERIN (NITROSTAT) 0.4 MG SL tablet Place 1 tablet (0.4 mg total) under the tongue every 5 (five) minutes x 3 doses as needed for chest pain.   [DISCONTINUED] levothyroxine (SYNTHROID) 112 MCG tablet Take 1 tablet (112 mcg total) by mouth daily before breakfast.   clopidogrel (PLAVIX) 75 MG tablet Take 1 tablet (75 mg total) by mouth daily with breakfast. (Patient not taking: Reported on 08/08/2021)   levothyroxine (SYNTHROID) 100 MCG tablet Take 1 tablet (100 mcg total) by mouth daily before breakfast.  lisinopril (ZESTRIL) 5 MG tablet Take 1 tablet (5 mg total) by mouth daily.   metoprolol tartrate (LOPRESSOR) 25 MG tablet Take 1.5 tablets (37.5 mg total) by mouth 2 (two) times daily.   No facility-administered encounter medications on file as of 08/08/2021.    ALLERGIES: Allergies  Allergen Reactions   Other Hives    shellfish   Cephalexin Rash   Doxycycline Rash    VACCINATION STATUS:  There is no immunization history on file for this patient.    Thyroid Problem Presents for follow-up visit. Symptoms include weight loss. Patient reports no cold intolerance, constipation, depressed mood, dry skin, fatigue, hair loss, leg swelling, palpitations or weight gain. The symptoms have been improving.   Linda Lin is 50 y.o. female who is returning for follow-up with repeat thyroid function  tests.  She is status post  I-131 therapy for Graves' disease in January 2019.   -She was subsequently started on thyroid hormone replacement.  She has not taken any thyroid hormone since October of last year.  She had a heart attack and had several social and financial stressors which contributed to her noncompliance.   Review of systems  Constitutional: + steadily decreasing body weight (losing some she inadvertently gained previously),  current Body mass index is 27.99 kg/m. , no fatigue, no subjective hyperthermia, no subjective hypothermia Eyes: no blurry vision, no xerophthalmia ENT: no sore throat, no nodules palpated in throat, no dysphagia/odynophagia, no hoarseness Cardiovascular: no chest pain, no shortness of breath, no palpitations, no leg swelling Respiratory: no cough, no shortness of breath Gastrointestinal: no nausea/vomiting/diarrhea Musculoskeletal: no muscle/joint aches Skin: no rashes, no hyperemia Neurological: no tremors, no numbness, no tingling, no dizziness Psychiatric: no depression, no anxiety   Objective:    BP 130/80   Pulse 92   Ht 5\' 3"  (1.6 m)   Wt 158 lb (71.7 kg)   LMP 05/08/2020   BMI 27.99 kg/m   Wt Readings from Last 3 Encounters:  08/08/21 158 lb (71.7 kg)  05/31/21 170 lb 3.2 oz (77.2 kg)  01/01/21 179 lb 9.6 oz (81.5 kg)      BP Readings from Last 3 Encounters:  08/08/21 130/80  05/31/21 122/79  01/01/21 (!) 148/100                 Physical Exam- Limited  Constitutional:  Body mass index is 27.99 kg/m. , not in acute distress, normal state of mind Eyes:  EOMI, no exophthalmos Neck: Supple Cardiovascular: RRR, no murmurs, rubs, or gallops, no edema Respiratory: Adequate breathing efforts, no crackles, rales, rhonchi, or wheezing Musculoskeletal: no gross deformities, strength intact in all four extremities, no gross restriction of joint movements Skin:  no rashes, no hyperemia Neurological: no tremor with outstretched  hands  CMP     Component Value Date/Time   NA 140 11/25/2020 0607   K 4.8 11/25/2020 0607   CL 104 11/25/2020 0607   CO2 25 11/25/2020 0607   GLUCOSE 142 (H) 11/25/2020 0607   BUN 20 11/25/2020 0607   CREATININE 0.84 11/25/2020 0607   CREATININE 0.33 (L) 06/26/2017 1016   CALCIUM 9.0 11/25/2020 0607   PROT 8.1 11/25/2020 0607   ALBUMIN 4.1 11/25/2020 0607   AST 19 11/25/2020 0607   ALT 16 11/25/2020 0607   ALKPHOS 77 11/25/2020 0607   BILITOT 0.4 11/25/2020 0607   GFRNONAA >60 11/25/2020 0607   GFRNONAA 132 06/26/2017 1016   GFRAA >60 06/10/2020 0503   GFRAA 153 06/26/2017 1016  CBC    Component Value Date/Time   WBC 13.2 (H) 11/25/2020 0607   RBC 3.58 (L) 11/25/2020 0607   HGB 11.8 (L) 11/25/2020 0607   HCT 37.9 11/25/2020 0607   PLT 330 11/25/2020 0607   MCV 105.9 (H) 11/25/2020 0607   MCH 33.0 11/25/2020 0607   MCHC 31.1 11/25/2020 0607   RDW 14.1 11/25/2020 0607   LYMPHSABS 1.6 11/25/2020 0607   MONOABS 0.3 11/25/2020 0607   EOSABS 0.0 11/25/2020 0607   BASOSABS 0.0 11/25/2020 0607    Lab Results  Component Value Date   TSH 2.190 07/31/2021   TSH 70.535 (H) 05/04/2021   TSH 22.408 (H) 11/25/2020   TSH 40.039 (H) 10/12/2020   TSH 8.370 (H) 06/09/2020   TSH 15.83 (H) 12/28/2019   TSH 34.07 (H) 09/27/2019   TSH 57.266 (H) 02/10/2019   TSH 33.22 (H) 02/17/2018   TSH <0.01 (L) 12/15/2017   FREET4 1.71 (H) 07/31/2021   FREET4 <0.25 (L) 05/04/2021   FREET4 0.96 06/09/2020   FREET4 1.0 12/28/2019   FREET4 0.7 (L) 09/27/2019   FREET4 0.9 03/08/2019   FREET4 0.2 (L) 02/17/2018   FREET4 1.2 12/15/2017   FREET4 4.5 (H) 09/17/2017      Assessment & Plan:   1.  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  Her previsit thyroid function tests are consistent with slight over-replacement.  She is advised to lower her dose of Levothyroxine to 100 mcg po daily before  breakfast.    - We discussed about the correct intake of her thyroid hormone, on empty stomach at fasting, with water, separated by at least 30 minutes from breakfast and other medications,  and separated by more than 4 hours from calcium, iron, multivitamins, acid reflux medications (PPIs). -Patient is made aware of the fact that thyroid hormone replacement is needed for life, dose to be adjusted by periodic monitoring of thyroid function tests.  - I advised her to maintain close follow up with her PCP for primary care needs.      I spent 20 minutes in the care of the patient today including review of labs from Thyroid Function, CMP, and other relevant labs ; imaging/biopsy records (current and previous including abstractions from other facilities); face-to-face time discussing  her lab results and symptoms, medications doses, her options of short and long term treatment based on the latest standards of care / guidelines;   and documenting the encounter.  [name]  participated in the discussions, expressed understanding, and voiced agreement with the above plans.  All questions were answered to her satisfaction. she is encouraged to contact clinic should she have any questions or concerns prior to her return visit.   Follow up plan: Return in about 3 months (around 11/08/2021) for Thyroid follow up, Previsit labs.  Thank you for involving me in the care of this pleasant patient, and I will continue to update you with her progress.  Ronny Bacon, [facility] [address] [place], [place] [address] Phone: [phone] Fax: [phone]  08/08/2021, 9:09 AM

## 2021-08-29 ENCOUNTER — Other Ambulatory Visit: Payer: Self-pay

## 2021-08-29 ENCOUNTER — Ambulatory Visit (INDEPENDENT_AMBULATORY_CARE_PROVIDER_SITE_OTHER): Payer: BC Managed Care – PPO | Admitting: Internal Medicine

## 2021-08-29 ENCOUNTER — Encounter: Payer: Self-pay | Admitting: Internal Medicine

## 2021-08-29 VITALS — BP 146/78 | HR 80 | Resp 18 | Ht 62.0 in | Wt 160.0 lb

## 2021-08-29 DIAGNOSIS — E782 Mixed hyperlipidemia: Secondary | ICD-10-CM | POA: Diagnosis not present

## 2021-08-29 DIAGNOSIS — I2510 Atherosclerotic heart disease of native coronary artery without angina pectoris: Secondary | ICD-10-CM | POA: Diagnosis not present

## 2021-08-29 DIAGNOSIS — I10 Essential (primary) hypertension: Secondary | ICD-10-CM | POA: Diagnosis not present

## 2021-08-29 DIAGNOSIS — Z720 Tobacco use: Secondary | ICD-10-CM

## 2021-08-29 DIAGNOSIS — E559 Vitamin D deficiency, unspecified: Secondary | ICD-10-CM

## 2021-08-29 DIAGNOSIS — E118 Type 2 diabetes mellitus with unspecified complications: Secondary | ICD-10-CM | POA: Diagnosis not present

## 2021-08-29 DIAGNOSIS — E890 Postprocedural hypothyroidism: Secondary | ICD-10-CM | POA: Diagnosis not present

## 2021-08-29 DIAGNOSIS — Z2821 Immunization not carried out because of patient refusal: Secondary | ICD-10-CM

## 2021-08-29 DIAGNOSIS — F17210 Nicotine dependence, cigarettes, uncomplicated: Secondary | ICD-10-CM | POA: Diagnosis not present

## 2021-08-29 DIAGNOSIS — Z1231 Encounter for screening mammogram for malignant neoplasm of breast: Secondary | ICD-10-CM

## 2021-08-29 DIAGNOSIS — J4530 Mild persistent asthma, uncomplicated: Secondary | ICD-10-CM

## 2021-08-29 MED ORDER — ATORVASTATIN CALCIUM 80 MG PO TABS
80.0000 mg | ORAL_TABLET | Freq: Every day | ORAL | 3 refills | Status: DC
Start: 2021-08-29 — End: 2021-10-03

## 2021-08-29 MED ORDER — FLUTICASONE FUROATE-VILANTEROL 100-25 MCG/ACT IN AEPB: 1.0000 | INHALATION_SPRAY | Freq: Every day | RESPIRATORY_TRACT | 11 refills | Status: DC

## 2021-08-29 MED ORDER — ALBUTEROL SULFATE HFA 108 (90 BASE) MCG/ACT IN AERS: 2.0000 | INHALATION_SPRAY | Freq: Four times a day (QID) | RESPIRATORY_TRACT | 5 refills | Status: DC | PRN

## 2021-08-29 MED ORDER — LISINOPRIL 5 MG PO TABS: 5.0000 mg | ORAL_TABLET | Freq: Every day | ORAL | 1 refills | Status: DC

## 2021-08-29 MED ORDER — METFORMIN HCL 500 MG PO TABS: 500.0000 mg | ORAL_TABLET | Freq: Two times a day (BID) | ORAL | 1 refills | Status: DC

## 2021-08-29 NOTE — Assessment & Plan Note (Signed)
H/o NSTEMI, managed medically On aspirin and statin On beta-blocker Followed by cardiology

## 2021-08-29 NOTE — Assessment & Plan Note (Signed)
Used to use Breo and as needed albuterol, has run out, refilled

## 2021-08-29 NOTE — Assessment & Plan Note (Signed)
Smokes about 4-5 cigarettes/day  Asked about quitting: confirms that she currently smokes cigarettes Advise to quit smoking: Educated about QUITTING to reduce the risk of cancer, cardio and cerebrovascular disease. Assess willingness: Unwilling to quit at this time, but is working on cutting back. Assist with counseling and pharmacotherapy: Counseled for 5 minutes and literature provided. Arrange for follow up: follow up in 3 months and continue to offer help. 

## 2021-08-29 NOTE — Assessment & Plan Note (Signed)
On statin Check lipid profile 

## 2021-08-29 NOTE — Assessment & Plan Note (Signed)
BP Readings from Last 1 Encounters:  08/29/21 (!) 146/78   uncontrolled with  lisinopril and metoprolol, could be related to nervousness from first visit, would avoid changing medication for now Counseled for compliance with the medications Advised DASH diet and moderate exercise/walking, at least 150 mins/week

## 2021-08-29 NOTE — Assessment & Plan Note (Signed)
H/o Graves disease, s/p radio iodine ablation On levothyroxine 100 mcg QD Followed by Dr. Fransico Him

## 2021-08-29 NOTE — Progress Notes (Signed)
New Patient Office Visit  Subjective:  Patient ID: Linda Lin, female    DOB: [birth year]  Age: 50 y.o. MRN: [id number]  CC:  Chief Complaint  Patient presents with   New Patient (Initial Visit)    New patient was seeing dr hagler has been out of diabetic medication     HPI Linda Lin is a 50 y.o. female with past medical history of CAD, HTN, asthma, hypothyroidism and tobacco abuse who presents for establishing care.  CAD: She had NSTEMI in 2021, was managed medically.  She had aspirin and Plavix for 1 year.  She currently takes aspirin.  She is also on beta-blocker and statin.  She denies any active chest pain, dyspnea or palpitations currently.  Chart review shows Eliquis on the last, but patient is not aware whether she has the medicine actually and does not know who had prescribed it.  Chart review does not show any history of arrhythmia, DVT or PE or any history to suggest use of AC.  Patient is strongly advised to check home meds and contact to clarify.  HTN: Her BP was elevated in the office today upon multiple measurements.  She takes lisinopril and metoprolol regularly.  She denies any headache or dizziness currently.  Asthma: She used to use Breo and as needed albuterol.  She has run out of Ebony and requests a refill.  Denies any dyspnea or wheezing currently.  Type II DM with HLD: She used to take Metformin in the past, but has run out of it.  Denies any polyuria or polydipsia currently.  She takes Lipitor for HLD.  Graves' disease s/p radioiodine ablation: Followed by Dr. Fransico Him.  She is on levothyroxine 100 mcg daily.  She has had COVID-vaccine.  She denied flu vaccine in the office today.      Past Medical History:  Diagnosis Date   Asthma    Cellulitis    Scabies    Thyroid disorder     Past Surgical History:  Procedure Laterality Date   LEFT HEART CATH AND CORONARY ANGIOGRAPHY N/A 06/09/2020   Procedure: LEFT HEART CATH AND CORONARY  ANGIOGRAPHY;  Surgeon: Iran Ouch, MD;  Location: [facility];  Service: Cardiovascular;  Laterality: N/A;    Family History  Problem Relation Age of Onset   Asthma Mother    Diabetes Father    Kidney failure Father    Hypertension Sister    Diabetes Mellitus II Brother    Hypertension Brother    CAD Maternal Grandmother    Cancer Neg Hx     Social History   Socioeconomic History   Marital status: Single    Spouse name: Not on file   Number of children: Not on file   Years of education: Not on file   Highest education level: Not on file  Occupational History   Occupation: Landscape architect: [employer]  Tobacco Use   Smoking status: Every Day    Packs/day: 0.25    Years: 20.00    Pack years: 5.00    Types: Cigarettes    Start date: 10/14/1987   Smokeless tobacco: Never  Vaping Use   Vaping Use: Never used  Substance and Sexual Activity   Alcohol use: Yes    Comment: occ   Drug use: Not Currently    Types: Marijuana    Comment: sporadically-last used sept 2019   Sexual activity: Not Currently    Partners: Male  Birth control/protection: None  Other Topics Concern   Not on file  Social History Narrative   Pt lives w/ her sister.   Supervisor.    Social Determinants of Health   Financial Resource Strain: Not on file  Food Insecurity: Not on file  Transportation Needs: Not on file  Physical Activity: Not on file  Stress: Not on file  Social Connections: Not on file  Intimate Partner Violence: Not on file    ROS Review of Systems  Constitutional:  Negative for chills and fever.  HENT:  Negative for congestion, sinus pressure, sinus pain and sore throat.   Eyes:  Negative for pain and discharge.  Respiratory:  Negative for cough and shortness of breath.   Cardiovascular:  Negative for chest pain and palpitations.  Gastrointestinal:  Negative for abdominal pain, constipation, diarrhea, nausea and vomiting.  Endocrine:  Negative for polydipsia and polyuria.  Genitourinary:  Negative for dysuria and hematuria.  Musculoskeletal:  Negative for neck pain and neck stiffness.  Skin:  Negative for rash.  Neurological:  Negative for dizziness and weakness.  Psychiatric/Behavioral:  Negative for agitation and behavioral problems.    Objective:   Today's Vitals: BP (!) 146/78 (BP Location: Left Arm, Patient Position: Sitting, Cuff Size: Normal)   Pulse 80   Resp 18   Ht $R'5\' 2"'nE$  (1.575 m)   Wt 160 lb 0.6 oz (72.6 kg)   LMP 05/08/2020   SpO2 98%   BMI 29.27 kg/m   Physical Exam Vitals reviewed.  Constitutional:      General: She is not in acute distress.    Appearance: She is not diaphoretic.  HENT:     Head: Normocephalic and atraumatic.     Nose: Nose normal.     Mouth/Throat:     Mouth: Mucous membranes are moist.  Eyes:     General: No scleral icterus.    Extraocular Movements: Extraocular movements intact.  Cardiovascular:     Rate and Rhythm: Normal rate and regular rhythm.     Pulses: Normal pulses.     Heart sounds: Normal heart sounds. No murmur heard. Pulmonary:     Breath sounds: Normal breath sounds. No wheezing or rales.  Abdominal:     Palpations: Abdomen is soft.     Tenderness: There is no abdominal tenderness.  Musculoskeletal:     Cervical back: Neck supple. No tenderness.     Right lower leg: No edema.     Left lower leg: No edema.  Skin:    General: Skin is warm.     Findings: No rash.  Neurological:     General: No focal deficit present.     Mental Status: She is alert and oriented to person, place, and time.     Sensory: No sensory deficit.     Motor: No weakness.  Psychiatric:        Mood and Affect: Mood normal.        Behavior: Behavior normal.    Assessment & Plan:   Problem List Items Addressed This Visit       Cardiovascular and Mediastinum   CAD (coronary artery disease)    H/o NSTEMI, managed medically On aspirin and statin On beta-blocker Followed  by cardiology      Relevant Medications   atorvastatin (LIPITOR) 80 MG tablet   lisinopril (ZESTRIL) 5 MG tablet   Essential hypertension    BP Readings from Last 1 Encounters:  08/29/21 (!) 146/78  uncontrolled with  lisinopril and metoprolol,  could be related to nervousness from first visit, would avoid changing medication for now Counseled for compliance with the medications Advised DASH diet and moderate exercise/walking, at least 150 mins/week       Relevant Medications   atorvastatin (LIPITOR) 80 MG tablet   lisinopril (ZESTRIL) 5 MG tablet   Other Relevant Orders   CMP14+EGFR   CBC with Differential/Platelet     Respiratory   Mild persistent asthma without complication    Used to use Breo and as needed albuterol, has run out, refilled      Relevant Medications   fluticasone furoate-vilanterol (BREO ELLIPTA) 100-25 MCG/ACT AEPB   albuterol (VENTOLIN HFA) 108 (90 Base) MCG/ACT inhaler     Endocrine   Hypothyroidism following radioiodine therapy    H/o Graves disease, s/p radio iodine ablation On levothyroxine 100 mcg QD Followed by Dr. Dorris Fetch      Type 2 diabetes mellitus with complication, without long-term current use of insulin ([place]) - Primary    Lab Results  Component Value Date   HGBA1C 6.7 (H) 11/24/2020   Has run out of Metformin, restarted Metformin 500 mg BID Advised to follow diabetic diet On statin and ACEi F/u CMP and lipid panel Diabetic foot exam: Today Diabetic eye exam: Advised to follow up with Ophthalmology for diabetic eye exam      Relevant Medications   atorvastatin (LIPITOR) 80 MG tablet   metFORMIN (GLUCOPHAGE) 500 MG tablet   lisinopril (ZESTRIL) 5 MG tablet   Other Relevant Orders   Microalbumin, urine   Hemoglobin A1c   CMP14+EGFR     Other   Tobacco abuse    Smokes about 4-5 cigarettes/day  Asked about quitting: confirms that she currently smokes cigarettes Advise to quit smoking: Educated about QUITTING to reduce the risk  of cancer, cardio and cerebrovascular disease. Assess willingness: Unwilling to quit at this time, but is working on cutting back. Assist with counseling and pharmacotherapy: Counseled for 5 minutes and literature provided. Arrange for follow up: follow up in 3 months and continue to offer help.      Hyperlipidemia    On statin Check lipid profile      Relevant Medications   atorvastatin (LIPITOR) 80 MG tablet   lisinopril (ZESTRIL) 5 MG tablet   Other Relevant Orders   Lipid panel   Vitamin D deficiency   Relevant Orders   VITAMIN D 25 Hydroxy (Vit-D Deficiency, Fractures)   Other Visit Diagnoses     Refused influenza vaccine       Screening mammogram for breast cancer       Relevant Orders   MM 3D SCREEN BREAST BILATERAL       Outpatient Encounter Medications as of 08/29/2021  Medication Sig   acetaminophen (TYLENOL) 500 MG tablet Take 500 mg by mouth every 6 (six) hours as needed for mild pain or moderate pain.    albuterol (PROVENTIL) (2.5 MG/3ML) 0.083% nebulizer solution Take 3 mLs (2.5 mg total) by nebulization every 4 (four) hours as needed for wheezing or shortness of breath.   aspirin 81 MG chewable tablet Chew 1 tablet (81 mg total) by mouth daily.   fluticasone furoate-vilanterol (BREO ELLIPTA) 100-25 MCG/ACT AEPB Inhale 1 puff into the lungs daily.   levothyroxine (SYNTHROID) 100 MCG tablet Take 1 tablet (100 mcg total) by mouth daily before breakfast.   metFORMIN (GLUCOPHAGE) 500 MG tablet Take 1 tablet (500 mg total) by mouth 2 (two) times daily with a meal.   nitroGLYCERIN (NITROSTAT) 0.4  MG SL tablet Place 1 tablet (0.4 mg total) under the tongue every 5 (five) minutes x 3 doses as needed for chest pain.   [DISCONTINUED] apixaban (ELIQUIS) 5 MG TABS tablet Take 5 mg by mouth 2 (two) times daily.   albuterol (VENTOLIN HFA) 108 (90 Base) MCG/ACT inhaler Inhale 2 puffs into the lungs every 6 (six) hours as needed for wheezing or shortness of breath.    atorvastatin (LIPITOR) 80 MG tablet Take 1 tablet (80 mg total) by mouth daily.   lisinopril (ZESTRIL) 5 MG tablet Take 1 tablet (5 mg total) by mouth daily.   metoprolol tartrate (LOPRESSOR) 25 MG tablet Take 1.5 tablets (37.5 mg total) by mouth 2 (two) times daily.   [DISCONTINUED] albuterol (VENTOLIN HFA) 108 (90 Base) MCG/ACT inhaler Inhale 2 puffs into the lungs every 6 (six) hours as needed for wheezing or shortness of breath. (Patient not taking: Reported on 08/29/2021)   [DISCONTINUED] atorvastatin (LIPITOR) 80 MG tablet Take 1 tablet (80 mg total) by mouth daily. (Patient not taking: Reported on 08/29/2021)   [DISCONTINUED] clopidogrel (PLAVIX) 75 MG tablet Take 1 tablet (75 mg total) by mouth daily with breakfast. (Patient not taking: Reported on 08/08/2021)   [DISCONTINUED] fluticasone furoate-vilanterol (BREO ELLIPTA) 100-25 MCG/INH AEPB Inhale 1 puff into the lungs daily. (Patient not taking: Reported on 08/29/2021)   [DISCONTINUED] lisinopril (ZESTRIL) 5 MG tablet Take 1 tablet (5 mg total) by mouth daily.   No facility-administered encounter medications on file as of 08/29/2021.    Follow-up: Return in about 3 months (around 11/27/2021) for HTN.   Lindell Spar, MD

## 2021-08-29 NOTE — Patient Instructions (Signed)
Please continue taking medications as prescribed.  Please follow low salt diet and ambulate as tolerated.  Please continue to try to cut down -> quit smoking.

## 2021-08-29 NOTE — Assessment & Plan Note (Signed)
Lab Results  Component Value Date   HGBA1C 6.7 (H) 11/24/2020    Has run out of Metformin, restarted Metformin 500 mg BID Advised to follow diabetic diet On statin and ACEi F/u CMP and lipid panel Diabetic foot exam: Today Diabetic eye exam: Advised to follow up with Ophthalmology for diabetic eye exam

## 2021-08-30 ENCOUNTER — Other Ambulatory Visit: Payer: Self-pay | Admitting: Internal Medicine

## 2021-08-30 DIAGNOSIS — E559 Vitamin D deficiency, unspecified: Secondary | ICD-10-CM

## 2021-08-30 LAB — CMP14+EGFR
ALT: 19 IU/L (ref 0–32)
AST: 36 IU/L (ref 0–40)
Albumin/Globulin Ratio: 0.9 — ABNORMAL LOW (ref 1.2–2.2)
Albumin: 4 g/dL (ref 3.8–4.9)
Alkaline Phosphatase: 229 IU/L — ABNORMAL HIGH (ref 44–121)
BUN/Creatinine Ratio: 12 (ref 9–23)
BUN: 10 mg/dL (ref 6–24)
Bilirubin Total: 0.4 mg/dL (ref 0.0–1.2)
CO2: 23 mmol/L (ref 20–29)
Calcium: 9.5 mg/dL (ref 8.7–10.2)
Chloride: 100 mmol/L (ref 96–106)
Creatinine, Ser: 0.84 mg/dL (ref 0.57–1.00)
Globulin, Total: 4.4 g/dL (ref 1.5–4.5)
Glucose: 119 mg/dL — ABNORMAL HIGH (ref 70–99)
Potassium: 4.9 mmol/L (ref 3.5–5.2)
Sodium: 135 mmol/L (ref 134–144)
Total Protein: 8.4 g/dL (ref 6.0–8.5)
eGFR: 84 mL/min/{1.73_m2} (ref >59)

## 2021-08-30 LAB — CBC WITH DIFFERENTIAL/PLATELET
Basophils Absolute: 0 10*3/uL (ref 0.0–0.2)
Basos: 1 %
EOS (ABSOLUTE): 0.1 10*3/uL (ref 0.0–0.4)
Eos: 2 %
Hematocrit: 31.2 % — ABNORMAL LOW (ref 34.0–46.6)
Hemoglobin: 9.9 g/dL — ABNORMAL LOW (ref 11.1–15.9)
Immature Grans (Abs): 0 10*3/uL (ref 0.0–0.1)
Immature Granulocytes: 0 %
Lymphocytes Absolute: 3.1 10*3/uL (ref 0.7–3.1)
Lymphs: 38 %
MCH: 26.8 pg (ref 26.6–33.0)
MCHC: 31.7 g/dL (ref 31.5–35.7)
MCV: 84 fL (ref 79–97)
Monocytes Absolute: 0.9 10*3/uL (ref 0.1–0.9)
Monocytes: 11 %
Neutrophils Absolute: 3.9 10*3/uL (ref 1.4–7.0)
Neutrophils: 48 %
Platelets: 411 10*3/uL (ref 150–450)
RBC: 3.7 x10E6/uL — ABNORMAL LOW (ref 3.77–5.28)
RDW: 15 % (ref 11.7–15.4)
WBC: 8 10*3/uL (ref 3.4–10.8)

## 2021-08-30 LAB — LIPID PANEL
Chol/HDL Ratio: 2.9 ratio (ref 0.0–4.4)
Cholesterol, Total: 176 mg/dL (ref 100–199)
HDL: 60 mg/dL (ref >39)
LDL Chol Calc (NIH): 103 mg/dL — ABNORMAL HIGH (ref 0–99)
Triglycerides: 66 mg/dL (ref 0–149)
VLDL Cholesterol Cal: 13 mg/dL (ref 5–40)

## 2021-08-30 LAB — HEMOGLOBIN A1C
Est. average glucose Bld gHb Est-mCnc: 114 mg/dL
Hgb A1c MFr Bld: 5.6 % (ref 4.8–5.6)

## 2021-08-30 LAB — VITAMIN D 25 HYDROXY (VIT D DEFICIENCY, FRACTURES): Vit D, 25-Hydroxy: 8.2 ng/mL — ABNORMAL LOW (ref 30.0–100.0)

## 2021-08-30 MED ORDER — VITAMIN D (ERGOCALCIFEROL) 1.25 MG (50000 UNIT) PO CAPS
50000.0000 [IU] | ORAL_CAPSULE | ORAL | 5 refills | Status: DC
Start: 2021-08-30 — End: 2022-01-01

## 2021-08-31 ENCOUNTER — Telehealth: Payer: Self-pay

## 2021-08-31 LAB — MICROALBUMIN, URINE: Microalbumin, Urine: 18.1 ug/mL

## 2021-08-31 NOTE — Telephone Encounter (Signed)
Clopidogrel is the blood thinner she is on.  She said the med expired and does not think she needs to be on it.  DR Irene Limbo gave it to her.  Thinks it came from the hospital.  Please give her a call and confirm she should not be taking this med.

## 2021-08-31 NOTE — Telephone Encounter (Signed)
Pt called back following up on the call about her med this am.  I read to her exactly what Dr Allena Katz commented.  No other action needed.

## 2021-08-31 NOTE — Progress Notes (Signed)
Pt advised with verbal understanding  °

## 2021-09-03 NOTE — Telephone Encounter (Signed)
Pt made aware via active mychart

## 2021-09-12 ENCOUNTER — Ambulatory Visit (HOSPITAL_COMMUNITY)
Admission: RE | Admit: 2021-09-12 | Discharge: 2021-09-12 | Disposition: A | Discharge status: Home / Self Care | Payer: BC Managed Care – PPO | Source: Ambulatory Visit | Attending: Internal Medicine | Admitting: Internal Medicine

## 2021-09-12 ENCOUNTER — Other Ambulatory Visit: Payer: Self-pay

## 2021-09-12 ENCOUNTER — Encounter (HOSPITAL_COMMUNITY): Payer: Self-pay

## 2021-09-12 DIAGNOSIS — Z1231 Encounter for screening mammogram for malignant neoplasm of breast: Secondary | ICD-10-CM | POA: Diagnosis not present

## 2021-09-20 ENCOUNTER — Other Ambulatory Visit: Payer: Self-pay

## 2021-09-20 ENCOUNTER — Ambulatory Visit: Payer: BC Managed Care – PPO

## 2021-09-20 LAB — HM DIABETES EYE EXAM

## 2021-09-26 ENCOUNTER — Other Ambulatory Visit: Payer: Self-pay

## 2021-09-26 ENCOUNTER — Other Ambulatory Visit (HOSPITAL_COMMUNITY)
Admission: RE | Admit: 2021-09-26 | Discharge: 2021-09-26 | Disposition: A | Discharge status: Home / Self Care | Payer: BC Managed Care – PPO | Source: Ambulatory Visit | Attending: Cardiology | Admitting: Cardiology

## 2021-09-26 DIAGNOSIS — E890 Postprocedural hypothyroidism: Secondary | ICD-10-CM | POA: Insufficient documentation

## 2021-10-03 ENCOUNTER — Ambulatory Visit (INDEPENDENT_AMBULATORY_CARE_PROVIDER_SITE_OTHER): Payer: BC Managed Care – PPO | Admitting: Cardiology

## 2021-10-03 ENCOUNTER — Other Ambulatory Visit: Payer: Self-pay

## 2021-10-03 ENCOUNTER — Encounter: Payer: Self-pay | Admitting: Cardiology

## 2021-10-03 VITALS — BP 144/90 | HR 79 | Ht 62.0 in | Wt 162.4 lb

## 2021-10-03 DIAGNOSIS — E118 Type 2 diabetes mellitus with unspecified complications: Secondary | ICD-10-CM | POA: Diagnosis not present

## 2021-10-03 DIAGNOSIS — I2510 Atherosclerotic heart disease of native coronary artery without angina pectoris: Secondary | ICD-10-CM

## 2021-10-03 DIAGNOSIS — I10 Essential (primary) hypertension: Secondary | ICD-10-CM | POA: Diagnosis not present

## 2021-10-03 DIAGNOSIS — E782 Mixed hyperlipidemia: Secondary | ICD-10-CM

## 2021-10-03 MED ORDER — ROSUVASTATIN CALCIUM 40 MG PO TABS: 40.0000 mg | ORAL_TABLET | Freq: Every day | ORAL | 6 refills | Status: DC

## 2021-10-03 NOTE — Progress Notes (Signed)
Clinical Summary Linda Lin is a 51 y.o.femaleseen today for follow up of the following medical problems.    1. CAD - admitted 05/2020 with NSTEMI - cath as reported below, 99% RPDA, 70% D1.  Given that the patient was chest pain-free and onset of MI was more than 48 hours, risks of PCI on right PDA were felt to outweigh the benefits and the overall supplied territory is relatively small. Therefore, medical therapy was recommended - Echo showed LVEF of 55-60% with normal wall motion and grade 1 diastolic dysfunction.      - no recent chest pains. No SOB/DOE - compliant with meds      2. Hyperlipidemia - started on lipitor during 05/2020 admission - Jan 2022 TC 153 TG 58 HDL 69 LDL 72  - 08/2021 TC 176 TG 66 HDL 60 LDL 103   3. HTN - has not had meds yet today - home bp's typicalls 120s/80s - has had some white coat HTN in the past     4. DM2 - glycemic control per pcp - last HgbA1c was 5.6    5. Hypothyroid - on synthroid.    6. Asthma - recent admission 11/2020 with asthma exacerbation      SH Works as Glass blower/designer.     Past Medical History:  Diagnosis Date   Asthma    Cellulitis    Scabies    Thyroid disorder      Allergies  Allergen Reactions   Other Hives    shellfish   Cephalexin Rash   Doxycycline Rash     Current Outpatient Medications  Medication Sig Dispense Refill   acetaminophen (TYLENOL) 500 MG tablet Take 500 mg by mouth every 6 (six) hours as needed for mild pain or moderate pain.      albuterol (PROVENTIL) (2.5 MG/3ML) 0.083% nebulizer solution Take 3 mLs (2.5 mg total) by nebulization every 4 (four) hours as needed for wheezing or shortness of breath. 75 mL 2   albuterol (VENTOLIN HFA) 108 (90 Base) MCG/ACT inhaler Inhale 2 puffs into the lungs every 6 (six) hours as needed for wheezing or shortness of breath. 1 each 5   aspirin 81 MG chewable tablet Chew 1 tablet (81 mg total) by mouth daily.     atorvastatin  (LIPITOR) 80 MG tablet Take 1 tablet (80 mg total) by mouth daily. 90 tablet 3   fluticasone furoate-vilanterol (BREO ELLIPTA) 100-25 MCG/ACT AEPB Inhale 1 puff into the lungs daily. 1 each 11   levothyroxine (SYNTHROID) 100 MCG tablet Take 1 tablet (100 mcg total) by mouth daily before breakfast. 90 tablet 1   lisinopril (ZESTRIL) 5 MG tablet Take 1 tablet (5 mg total) by mouth daily. 90 tablet 1   metFORMIN (GLUCOPHAGE) 500 MG tablet Take 1 tablet (500 mg total) by mouth 2 (two) times daily with a meal. 180 tablet 1   metoprolol tartrate (LOPRESSOR) 25 MG tablet Take 1.5 tablets (37.5 mg total) by mouth 2 (two) times daily. 270 tablet 1   nitroGLYCERIN (NITROSTAT) 0.4 MG SL tablet Place 1 tablet (0.4 mg total) under the tongue every 5 (five) minutes x 3 doses as needed for chest pain. 25 tablet 2   Vitamin D, Ergocalciferol, (DRISDOL) 1.25 MG (50000 UNIT) CAPS capsule Take 1 capsule (50,000 Units total) by mouth every 7 (seven) days. 5 capsule 5   No current facility-administered medications for this visit.     Past Surgical History:  Procedure Laterality Date   LEFT  HEART CATH AND CORONARY ANGIOGRAPHY N/A 06/09/2020   Procedure: LEFT HEART CATH AND CORONARY ANGIOGRAPHY;  Surgeon: Wellington Hampshire, MD;  Location: [facility];  Service: Cardiovascular;  Laterality: N/A;     Allergies  Allergen Reactions   Other Hives    shellfish   Cephalexin Rash   Doxycycline Rash      Family History  Problem Relation Age of Onset   Asthma Mother    Diabetes Father    Kidney failure Father    Hypertension Sister    CAD Maternal Grandmother    Breast cancer Cousin    Diabetes Mellitus II Brother    Hypertension Brother    Cancer Neg Hx      Social History Linda Lin reports that she has been smoking cigarettes. She started smoking about 33 years ago. She has a 5.00 pack-year smoking history. She has never used smokeless tobacco. Linda Lin reports current alcohol  use.   Review of Systems CONSTITUTIONAL: No weight loss, fever, chills, weakness or fatigue.  HEENT: Eyes: No visual loss, blurred vision, double vision or yellow sclerae.No hearing loss, sneezing, congestion, runny nose or sore throat.  SKIN: No rash or itching.  CARDIOVASCULAR: per hpi RESPIRATORY: No shortness of breath, cough or sputum.  GASTROINTESTINAL: No anorexia, nausea, vomiting or diarrhea. No abdominal pain or blood.  GENITOURINARY: No burning on urination, no polyuria NEUROLOGICAL: No headache, dizziness, syncope, paralysis, ataxia, numbness or tingling in the extremities. No change in bowel or bladder control.  MUSCULOSKELETAL: No muscle, back pain, joint pain or stiffness.  LYMPHATICS: No enlarged nodes. No history of splenectomy.  PSYCHIATRIC: No history of depression or anxiety.  ENDOCRINOLOGIC: No reports of sweating, cold or heat intolerance. No polyuria or polydipsia.  Marland Kitchen   Physical Examination Today's Vitals   10/03/21 0949  BP: (!) 168/82  Pulse: 79  SpO2: 98%  Weight: 162 lb 6.4 oz (73.7 kg)  Height: 5\' 2"  (1.575 m)   Body mass index is 29.7 kg/m.  Gen: resting comfortably, no acute distress HEENT: no scleral icterus, pupils equal round and reactive, no palptable cervical adenopathy,  CV: RRR, no m/r/g, no jvd Resp: Clear to auscultation bilaterally GI: abdomen is soft, non-tender, non-distended, normal bowel sounds, no hepatosplenomegaly MSK: extremities are warm, no edema.  Skin: warm, no rash Neuro:  no focal deficits Psych: appropriate affect    Assessment and Plan  1. CAD - no recent symptoms, continue current meds   2. HTN -elevated in clinic but home numbers at goal, looks to have a component of white coat HTN.  - continue current meds   3. Hyperlipidemia - above goal given her history of CAD, will change lipitor to crestor 40mg  daily.      4. DM2 - A1c is at goal, managed by pcp - from cardiac standpoint she is on statin and  ACE-I   F/u 6 months   Arnoldo Lenis, M.D.

## 2021-10-03 NOTE — Patient Instructions (Signed)
Medication Instructions:  Stop Lipitor (Atorvastatin) Begin Crestor 40mg daily  Continue all other medications.     Labwork: none  Testing/Procedures: none  Follow-Up: 6 months   Any Other Special Instructions Will Be Listed Below (If Applicable).   If you need a refill on your cardiac medications before your next appointment, please call your pharmacy.  

## 2021-11-08 ENCOUNTER — Ambulatory Visit: Payer: BC Managed Care – PPO | Admitting: Nurse Practitioner

## 2021-11-08 ENCOUNTER — Other Ambulatory Visit (HOSPITAL_COMMUNITY)
Admission: RE | Admit: 2021-11-08 | Discharge: 2021-11-08 | Disposition: A | Discharge status: Home / Self Care | Payer: BC Managed Care – PPO | Source: Ambulatory Visit | Attending: "Endocrinology | Admitting: "Endocrinology

## 2021-11-08 DIAGNOSIS — E890 Postprocedural hypothyroidism: Secondary | ICD-10-CM | POA: Diagnosis not present

## 2021-11-08 LAB — T4, FREE: Free T4: 0.84 ng/dL (ref 0.61–1.12)

## 2021-11-08 LAB — TSH: TSH: 28.107 u[IU]/mL — ABNORMAL HIGH (ref 0.350–4.500)

## 2021-11-15 ENCOUNTER — Encounter: Payer: Self-pay | Admitting: Nurse Practitioner

## 2021-11-15 ENCOUNTER — Ambulatory Visit (INDEPENDENT_AMBULATORY_CARE_PROVIDER_SITE_OTHER): Payer: BC Managed Care – PPO | Admitting: Nurse Practitioner

## 2021-11-15 VITALS — BP 146/85 | HR 75 | Ht 62.0 in | Wt 156.6 lb

## 2021-11-15 DIAGNOSIS — E890 Postprocedural hypothyroidism: Secondary | ICD-10-CM

## 2021-11-15 NOTE — Progress Notes (Signed)
11/15/2021                         Endocrinology follow-up note   Subjective:    Patient ID: Linda Lin, female    DOB: [birth year], PCP Anabel Halon, MD.   Past Medical History:  Diagnosis Date   Asthma    Cellulitis    Heart attack (HCC) 2021   Scabies    Thyroid disorder    Past Surgical History:  Procedure Laterality Date   LEFT HEART CATH AND CORONARY ANGIOGRAPHY N/A 06/09/2020   Procedure: LEFT HEART CATH AND CORONARY ANGIOGRAPHY;  Surgeon: Iran Ouch, MD;  Location: [facility];  Service: Cardiovascular;  Laterality: N/A;   Social History   Socioeconomic History   Marital status: Single    Spouse name: Not on file   Number of children: Not on file   Years of education: Not on file   Highest education level: Not on file  Occupational History   Occupation: Landscape architect: [employer]  Tobacco Use   Smoking status: Every Day    Packs/day: 0.25    Years: 20.00    Pack years: 5.00    Types: Cigarettes    Start date: 10/14/1987   Smokeless tobacco: Never  Vaping Use   Vaping Use: Never used  Substance and Sexual Activity   Alcohol use: Yes    Comment: occ   Drug use: Not Currently    Types: Marijuana    Comment: sporadically-last used sept 2019   Sexual activity: Not Currently    Partners: Male    Birth control/protection: None  Other Topics Concern   Not on file  Social History Narrative   Pt lives w/ her sister.   Supervisor.    Social Determinants of Health   Financial Resource Strain: Not on file  Food Insecurity: Not on file  Transportation Needs: Not on file  Physical Activity: Not on file  Stress: Not on file  Social Connections: Not on file   Outpatient Encounter Medications as of 11/15/2021  Medication Sig   acetaminophen (TYLENOL) 500 MG tablet Take 500 mg by mouth every 6 (six) hours as needed for mild pain or moderate pain.    albuterol (PROVENTIL) (2.5 MG/3ML) 0.083% nebulizer solution  Take 3 mLs (2.5 mg total) by nebulization every 4 (four) hours as needed for wheezing or shortness of breath.   albuterol (VENTOLIN HFA) 108 (90 Base) MCG/ACT inhaler Inhale 2 puffs into the lungs every 6 (six) hours as needed for wheezing or shortness of breath.   aspirin 81 MG chewable tablet Chew 1 tablet (81 mg total) by mouth daily.   fluticasone furoate-vilanterol (BREO ELLIPTA) 100-25 MCG/ACT AEPB Inhale 1 puff into the lungs daily.   levothyroxine (SYNTHROID) 100 MCG tablet Take 1 tablet (100 mcg total) by mouth daily before breakfast.   lisinopril (ZESTRIL) 5 MG tablet Take 1 tablet (5 mg total) by mouth daily.   metFORMIN (GLUCOPHAGE) 500 MG tablet Take 1 tablet (500 mg total) by mouth 2 (two) times daily with a meal.   metoprolol tartrate (LOPRESSOR) 25 MG tablet Take 1.5 tablets (37.5 mg total) by mouth 2 (two) times daily.   nitroGLYCERIN (NITROSTAT) 0.4 MG SL tablet Place 1 tablet (0.4 mg total) under the tongue every 5 (five) minutes x 3 doses as needed for chest pain.   rosuvastatin (CRESTOR) 40 MG tablet Take 1 tablet (40 mg total) by  mouth daily.   Vitamin D, Ergocalciferol, (DRISDOL) 1.25 MG (50000 UNIT) CAPS capsule Take 1 capsule (50,000 Units total) by mouth every 7 (seven) days.   No facility-administered encounter medications on file as of 11/15/2021.    ALLERGIES: Allergies  Allergen Reactions   Other Hives    shellfish   Cephalexin Rash   Doxycycline Rash    VACCINATION STATUS: Immunization History  Administered Date(s) Administered   PFIZER Comirnaty(Gray Top)Covid-19 Tri-Sucrose Vaccine 09/30/2020, 10/21/2020      Thyroid Problem Presents for follow-up visit. Patient reports no cold intolerance, constipation, depressed mood, dry skin, fatigue, hair loss, leg swelling, palpitations, weight gain or weight loss. The symptoms have been stable.   Linda Lin is 51 y.o. female who is returning for follow-up with repeat thyroid function tests.  She is  status post I-131 therapy for Graves' disease in January 2019.   -She was subsequently started on thyroid hormone replacement.  She had a heart attack and had several social and financial stressors which contributed to her noncompliance.   Review of systems  Constitutional: + steadily decreasing body weight (losing some she inadvertently gained previously),  current Body mass index is 28.64 kg/m. , no fatigue, no subjective hyperthermia, no subjective hypothermia Eyes: no blurry vision, no xerophthalmia ENT: no sore throat, no nodules palpated in throat, no dysphagia/odynophagia, no hoarseness Cardiovascular: no chest pain, no shortness of breath, no palpitations, no leg swelling Respiratory: no cough, no shortness of breath Gastrointestinal: no nausea/vomiting/diarrhea Musculoskeletal: no muscle/joint aches Skin: no rashes, no hyperemia Neurological: no tremors, no numbness, no tingling, no dizziness Psychiatric: no depression, no anxiety   Objective:    BP (!) 146/85    Pulse 75    Ht  (1.575 m)    Wt 156 lb 9.6 oz (71 kg)    LMP 05/08/2020    BMI 28.64 kg/m   Wt Readings from Last 3 Encounters:  11/15/21 156 lb 9.6 oz (71 kg)  10/03/21 162 lb 6.4 oz (73.7 kg)  08/29/21 160 lb 0.6 oz (72.6 kg)      BP Readings from Last 3 Encounters:  11/15/21 (!) 146/85  10/03/21 (!) 144/90  08/29/21 (!) 146/78                  Physical Exam- Limited  Constitutional:  Body mass index is 28.64 kg/m. , not in acute distress, normal state of mind Eyes:  EOMI, no exophthalmos Neck: Supple Cardiovascular: RRR, no murmurs, rubs, or gallops, no edema Respiratory: Adequate breathing efforts, no crackles, rales, rhonchi, or wheezing Musculoskeletal: no gross deformities, strength intact in all four extremities, no gross restriction of joint movements Skin:  no rashes, no hyperemia Neurological: no tremor with outstretched hands  CMP     Component Value Date/Time   NA 135 08/29/2021  0904   K 4.9 08/29/2021 0904   CL 100 08/29/2021 0904   CO2 23 08/29/2021 0904   GLUCOSE 119 (H) 08/29/2021 0904   GLUCOSE 142 (H) 11/25/2020 0607   BUN 10 08/29/2021 0904   CREATININE 0.84 08/29/2021 0904   CREATININE 0.33 (L) 06/26/2017 1016   CALCIUM 9.5 08/29/2021 0904   PROT 8.4 08/29/2021 0904   ALBUMIN 4.0 08/29/2021 0904   AST 36 08/29/2021 0904   ALT 19 08/29/2021 0904   ALKPHOS 229 (H) 08/29/2021 0904   BILITOT 0.4 08/29/2021 0904   GFRNONAA >60 11/25/2020 0607   GFRNONAA 132 06/26/2017 1016   GFRAA >60 06/10/2020 0503   GFRAA  153 06/26/2017 1016    CBC    Component Value Date/Time   WBC 8.0 08/29/2021 0904   WBC 13.2 (H) 11/25/2020 0607   RBC 3.70 (L) 08/29/2021 0904   RBC 3.58 (L) 11/25/2020 0607   HGB 9.9 (L) 08/29/2021 0904   HCT 31.2 (L) 08/29/2021 0904   PLT 411 08/29/2021 0904   MCV 84 08/29/2021 0904   MCH 26.8 08/29/2021 0904   MCH 33.0 11/25/2020 0607   MCHC 31.7 08/29/2021 0904   MCHC 31.1 11/25/2020 0607   RDW 15.0 08/29/2021 0904   LYMPHSABS 3.1 08/29/2021 0904   MONOABS 0.3 11/25/2020 0607   EOSABS 0.1 08/29/2021 0904   BASOSABS 0.0 08/29/2021 0904    Lab Results  Component Value Date   TSH 28.107 (H) 11/08/2021   TSH 2.190 07/31/2021   TSH 70.535 (H) 05/04/2021   TSH 22.408 (H) 11/25/2020   TSH 40.039 (H) 10/12/2020   TSH 8.370 (H) 06/09/2020   TSH 15.83 (H) 12/28/2019   TSH 34.07 (H) 09/27/2019   TSH 57.266 (H) 02/10/2019   TSH 33.22 (H) 02/17/2018   FREET4 0.84 11/08/2021   FREET4 1.71 (H) 07/31/2021   FREET4 <0.25 (L) 05/04/2021   FREET4 0.96 06/09/2020   FREET4 1.0 12/28/2019   FREET4 0.7 (L) 09/27/2019   FREET4 0.9 03/08/2019   FREET4 0.2 (L) 02/17/2018   FREET4 1.2 12/15/2017   FREET4 4.5 (H) 09/17/2017      Assessment & Plan:   1.  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  Her previsit thyroid function tests are  consistent with slight under-replacement however she does admit to missing doses.  She is advised to continue her dose of Levothyroxine to 100 mcg po daily before breakfast but to take it more consistently.  I suggested she set an alarm on her phone to act as a reminder and she will give it a try.   - We discussed about the correct intake of her thyroid hormone, on empty stomach at fasting, with water, separated by at least 30 minutes from breakfast and other medications,  and separated by more than 4 hours from calcium, iron, multivitamins, acid reflux medications (PPIs). -Patient is made aware of the fact that thyroid hormone replacement is needed for life, dose to be adjusted by periodic monitoring of thyroid function tests.  - I advised her to maintain close follow up with her PCP for primary care needs.      I spent 20 minutes in the care of the patient today including review of labs from Thyroid Function, CMP, and other relevant labs ; imaging/biopsy records (current and previous including abstractions from other facilities); face-to-face time discussing  her lab results and symptoms, medications doses, her options of short and long term treatment based on the latest standards of care / guidelines;   and documenting the encounter.  [name]  participated in the discussions, expressed understanding, and voiced agreement with the above plans.  All questions were answered to her satisfaction. she is encouraged to contact clinic should she have any questions or concerns prior to her return visit.   Follow up plan: Return in about 3 months (around 02/12/2022) for Thyroid follow up, Previsit labs.  Thank you for involving me in the care of this pleasant patient, and I will continue to update you with her progress.  Ronny Bacon, FNP-BC [facility] [address] [place],  [place] [address] Phone: [phone] Fax: [phone]  11/15/2021, 10:50  AM

## 2021-11-15 NOTE — Patient Instructions (Signed)

## 2021-11-27 ENCOUNTER — Ambulatory Visit: Payer: BC Managed Care – PPO | Admitting: Internal Medicine

## 2022-01-01 ENCOUNTER — Ambulatory Visit (INDEPENDENT_AMBULATORY_CARE_PROVIDER_SITE_OTHER): Payer: PRIVATE HEALTH INSURANCE | Admitting: Internal Medicine

## 2022-01-01 ENCOUNTER — Encounter: Payer: Self-pay | Admitting: Internal Medicine

## 2022-01-01 ENCOUNTER — Encounter (INDEPENDENT_AMBULATORY_CARE_PROVIDER_SITE_OTHER): Payer: Self-pay | Admitting: *Deleted

## 2022-01-01 VITALS — BP 138/88 | HR 60 | Resp 18 | Ht 62.0 in | Wt 163.8 lb

## 2022-01-01 DIAGNOSIS — E118 Type 2 diabetes mellitus with unspecified complications: Secondary | ICD-10-CM | POA: Diagnosis not present

## 2022-01-01 DIAGNOSIS — I2510 Atherosclerotic heart disease of native coronary artery without angina pectoris: Secondary | ICD-10-CM

## 2022-01-01 DIAGNOSIS — Z720 Tobacco use: Secondary | ICD-10-CM

## 2022-01-01 DIAGNOSIS — E890 Postprocedural hypothyroidism: Secondary | ICD-10-CM | POA: Diagnosis not present

## 2022-01-01 DIAGNOSIS — E559 Vitamin D deficiency, unspecified: Secondary | ICD-10-CM

## 2022-01-01 DIAGNOSIS — M1811 Unilateral primary osteoarthritis of first carpometacarpal joint, right hand: Secondary | ICD-10-CM

## 2022-01-01 DIAGNOSIS — I10 Essential (primary) hypertension: Secondary | ICD-10-CM | POA: Diagnosis not present

## 2022-01-01 DIAGNOSIS — Z1211 Encounter for screening for malignant neoplasm of colon: Secondary | ICD-10-CM

## 2022-01-01 DIAGNOSIS — J4530 Mild persistent asthma, uncomplicated: Secondary | ICD-10-CM

## 2022-01-01 MED ORDER — VITAMIN D (ERGOCALCIFEROL) 1.25 MG (50000 UNIT) PO CAPS: 50000.0000 [IU] | ORAL_CAPSULE | ORAL | 1 refills | Status: DC

## 2022-01-01 MED ORDER — ALBUTEROL SULFATE HFA 108 (90 BASE) MCG/ACT IN AERS: 2.0000 | INHALATION_SPRAY | Freq: Four times a day (QID) | RESPIRATORY_TRACT | 5 refills | Status: DC | PRN

## 2022-01-01 NOTE — Assessment & Plan Note (Signed)
Smokes about 4-5 cigarettes/day  Asked about quitting: confirms that she currently smokes cigarettes Advise to quit smoking: Educated about QUITTING to reduce the risk of cancer, cardio and cerebrovascular disease. Assess willingness: Unwilling to quit at this time, but is working on cutting back. Assist with counseling and pharmacotherapy: Counseled for 5 minutes and literature provided. Arrange for follow up: follow up in 3 months and continue to offer help. 

## 2022-01-01 NOTE — Assessment & Plan Note (Signed)
H/o NSTEMI, managed medically On aspirin and statin On beta-blocker Followed by cardiology 

## 2022-01-01 NOTE — Patient Instructions (Signed)
Please use Voltaren gel for hand pain. ? ?Please use wrist brace at nighttime. ? ?Please continue to follow low carb diet and perform moderate exercise/walking at least 150 mins/week. ?

## 2022-01-01 NOTE — Assessment & Plan Note (Signed)
Well controlled now Breo  ?Refilledas needed albuterol ?

## 2022-01-01 NOTE — Assessment & Plan Note (Signed)
Lab Results  ?Component Value Date  ? HGBA1C 5.6 08/29/2021  ? ? ?On Metformin 500 mg BID ?Advised to follow diabetic diet ?On statin and ACEi ?F/u CMP and lipid panel ?Diabetic eye exam: Advised to follow up with Ophthalmology for diabetic eye exam ?

## 2022-01-01 NOTE — Assessment & Plan Note (Signed)
H/o Graves disease, s/p radio iodine ablation On levothyroxine 100 mcg QD Followed by Dr. Nida 

## 2022-01-01 NOTE — Assessment & Plan Note (Signed)
Right thumb area pain, likely due to arthritis of CMC joint ?Advised to use Voltaren gel as needed ?Tylenol as needed ?Referred to orthopedic surgery ?

## 2022-01-01 NOTE — Assessment & Plan Note (Signed)
BP Readings from Last 1 Encounters:  ?01/01/22 138/88  ? ?Well-controlled with  lisinopril and metoprolol ?Counseled for compliance with the medications ?Advised DASH diet and moderate exercise/walking, at least 150 mins/week ? ?

## 2022-01-01 NOTE — Progress Notes (Signed)
? ?Established Patient Office Visit ? ?Subjective:  ?Patient ID: Linda Lin, female    DOB: 31-Oct-1970  Age: 51 y.o. MRN: [id number] ? ?CC:  ?Chief Complaint  ?Patient presents with  ? Follow-up  ?  3 month follow up pt right hand / thumb has been hurting on and off for about 2 months has been wearing brace but this isnt helping   ? ? ?HPI ?Linda Lin is a 51 y.o. female with past medical history of CAD, HTN, asthma, hypothyroidism and tobacco abuse who presents for f/u of her chronic medical conditions. ? ?HTN: BP is well-controlled. Takes medications regularly. Patient denies headache, dizziness, chest pain, dyspnea or palpitations. ? ?Asthma: Her symptoms are well-controlled now with Breo, but she does not have albuterol inhaler currently.  She denies any dyspnea currently, but has mild wheezing today. ? ?Type 2 DM: Her HbA1c has improved to 5.6 now.  She has been taking metformin regularly.  She denies any polyuria, polydipsia or polyphagia currently. ? ?She complains of right hand pain, around the base of the thumb.  She does report repetitive movement of her hand at work, which worsens her pain.  She currently denies any numbness or tingling of the hand. ? ? ? ? ?Past Medical History:  ?Diagnosis Date  ? Asthma   ? Cellulitis   ? Heart attack ([place]) 2021  ? Scabies   ? Thyroid disorder   ? ? ?Past Surgical History:  ?Procedure Laterality Date  ? LEFT HEART CATH AND CORONARY ANGIOGRAPHY N/A 06/09/2020  ? Procedure: LEFT HEART CATH AND CORONARY ANGIOGRAPHY;  Surgeon: Wellington Hampshire, MD;  Location: [place] [facility];  Service: Cardiovascular;  Laterality: N/A;  ? ? ?Family History  ?Problem Relation Age of Onset  ? Asthma Mother   ? Diabetes Father   ? Kidney failure Father   ? Hypertension Sister   ? CAD Maternal Grandmother   ? Breast cancer Cousin   ? Diabetes Mellitus II Brother   ? Hypertension Brother   ? Cancer Neg Hx   ? ? ?Social History  ? ?Socioeconomic History  ? Marital status:  Single  ?  Spouse name: Not on file  ? Number of children: Not on file  ? Years of education: Not on file  ? Highest education level: Not on file  ?Occupational History  ? Occupation: Public affairs consultant  ?  Employer: [employer]  ?Tobacco Use  ? Smoking status: Every Day  ?  Packs/day: 0.25  ?  Years: 20.00  ?  Pack years: 5.00  ?  Types: Cigarettes  ?  Start date: 10/14/1987  ? Smokeless tobacco: Never  ?Vaping Use  ? Vaping Use: Never used  ?Substance and Sexual Activity  ? Alcohol use: Yes  ?  Comment: occ  ? Drug use: Not Currently  ?  Types: Marijuana  ?  Comment: sporadically-last used sept 2019  ? Sexual activity: Not Currently  ?  Partners: Male  ?  Birth control/protection: None  ?Other Topics Concern  ? Not on file  ?Social History Narrative  ? 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  ? Librarian, academic.   ? ?Social Determinants of Health  ? ?Financial Resource Strain: Not on file  ?Food Insecurity: Not on file  ?Transportation Needs: Not on file  ?Physical Activity: Not on file  ?Stress: Not on file  ?Social Connections: Not on file  ?Intimate Partner Violence: Not on file  ? ? ?Outpatient Medications Prior to Visit  ?  Medication Sig Dispense Refill  ? acetaminophen (TYLENOL) 500 MG tablet Take 500 mg by mouth every 6 (six) hours as needed for mild pain or moderate pain.     ? aspirin 81 MG chewable tablet Chew 1 tablet (81 mg total) by mouth daily.    ? fluticasone furoate-vilanterol (BREO ELLIPTA) 100-25 MCG/ACT AEPB Inhale 1 puff into the lungs daily. 1 each 11  ? levothyroxine (SYNTHROID) 100 MCG tablet Take 1 tablet (100 mcg total) by mouth daily before breakfast. 90 tablet 1  ? lisinopril (ZESTRIL) 5 MG tablet Take 1 tablet (5 mg total) by mouth daily. 90 tablet 1  ? metFORMIN (GLUCOPHAGE) 500 MG tablet Take 1 tablet (500 mg total) by mouth 2 (two) times daily with a meal. 180 tablet 1  ? nitroGLYCERIN (NITROSTAT) 0.4 MG SL tablet Place 1 tablet (0.4 mg total) under the tongue every 5 (five) minutes x 3 doses as needed  for chest pain. 25 tablet 2  ? rosuvastatin (CRESTOR) 40 MG tablet Take 1 tablet (40 mg total) by mouth daily. 30 tablet 6  ? albuterol (VENTOLIN HFA) 108 (90 Base) MCG/ACT inhaler Inhale 2 puffs into the lungs every 6 (six) hours as needed for wheezing or shortness of breath. 1 each 5  ? Vitamin D, Ergocalciferol, (DRISDOL) 1.25 MG (50000 UNIT) CAPS capsule Take 1 capsule (50,000 Units total) by mouth every 7 (seven) days. 5 capsule 5  ? albuterol (PROVENTIL) (2.5 MG/3ML) 0.083% nebulizer solution Take 3 mLs (2.5 mg total) by nebulization every 4 (four) hours as needed for wheezing or shortness of breath. 75 mL 2  ? metoprolol tartrate (LOPRESSOR) 25 MG tablet Take 1.5 tablets (37.5 mg total) by mouth 2 (two) times daily. 270 tablet 1  ? ?No facility-administered medications prior to visit.  ? ? ?Allergies  ?Allergen Reactions  ? Other Hives  ?  shellfish  ? Cephalexin Rash  ? Doxycycline Rash  ? ? ?ROS ?Review of Systems  ?Constitutional:  Negative for chills and fever.  ?HENT:  Negative for congestion, sinus pressure, sinus pain and sore throat.   ?Eyes:  Negative for pain and discharge.  ?Respiratory:  Negative for cough and shortness of breath.   ?Cardiovascular:  Negative for chest pain and palpitations.  ?Gastrointestinal:  Negative for abdominal pain, diarrhea, nausea and vomiting.  ?Endocrine: Negative for polydipsia and polyuria.  ?Genitourinary:  Negative for dysuria and hematuria.  ?Musculoskeletal:  Positive for arthralgias (Right hand). Negative for neck pain and neck stiffness.  ?Skin:  Negative for rash.  ?Neurological:  Negative for dizziness and weakness.  ?Psychiatric/Behavioral:  Negative for agitation and behavioral problems.   ? ?  ?Objective:  ?  ?Physical Exam ?Vitals reviewed.  ?Constitutional:   ?   General: She is not in acute distress. ?   Appearance: She is not diaphoretic.  ?HENT:  ?   Head: Normocephalic and atraumatic.  ?   Nose: Nose normal.  ?   Mouth/Throat:  ?   Mouth: Mucous  membranes are moist.  ?Eyes:  ?   General: No scleral icterus. ?   Extraocular Movements: Extraocular movements intact.  ?Cardiovascular:  ?   Rate and Rhythm: Normal rate and regular rhythm.  ?   Pulses: Normal pulses.  ?   Heart sounds: Normal heart sounds. No murmur heard. ?Pulmonary:  ?   Breath sounds: Normal breath sounds. No wheezing or rales.  ?Musculoskeletal:     ?   General: Tenderness (CMC joint of first digit of right  hand, with mild swelling) present.  ?   Cervical back: Neck supple. No tenderness.  ?   Right lower leg: No edema.  ?   Left lower leg: No edema.  ?Skin: ?   General: Skin is warm.  ?   Findings: No rash.  ?Neurological:  ?   General: No focal deficit present.  ?   Mental Status: She is alert and oriented to person, place, and time.  ?   Sensory: No sensory deficit.  ?   Motor: No weakness.  ?Psychiatric:     ?   Mood and Affect: Mood normal.     ?   Behavior: Behavior normal.  ? ? ?BP 138/88 (BP Location: Right Arm, Patient Position: Sitting, Cuff Size: Normal)   Pulse 60   Resp 18   Ht $R'5\' 2"'xH$  (1.575 m)   Wt 163 lb 12.8 oz (74.3 kg)   LMP 05/08/2020   SpO2 100%   BMI 29.96 kg/m?  ?Wt Readings from Last 3 Encounters:  ?01/01/22 163 lb 12.8 oz (74.3 kg)  ?11/15/21 156 lb 9.6 oz (71 kg)  ?10/03/21 162 lb 6.4 oz (73.7 kg)  ? ? ?Lab Results  ?Component Value Date  ? TSH 28.107 (H) 11/08/2021  ? ?Lab Results  ?Component Value Date  ? WBC 8.0 08/29/2021  ? HGB 9.9 (L) 08/29/2021  ? HCT 31.2 (L) 08/29/2021  ? MCV 84 08/29/2021  ? PLT 411 08/29/2021  ? ?Lab Results  ?Component Value Date  ? NA 135 08/29/2021  ? K 4.9 08/29/2021  ? CO2 23 08/29/2021  ? GLUCOSE 119 (H) 08/29/2021  ? BUN 10 08/29/2021  ? CREATININE 0.84 08/29/2021  ? BILITOT 0.4 08/29/2021  ? ALKPHOS 229 (H) 08/29/2021  ? AST 36 08/29/2021  ? ALT 19 08/29/2021  ? PROT 8.4 08/29/2021  ? ALBUMIN 4.0 08/29/2021  ? CALCIUM 9.5 08/29/2021  ? ANIONGAP 11 11/25/2020  ? EGFR 84 08/29/2021  ? ?Lab Results  ?Component Value Date  ? CHOL  176 08/29/2021  ? ?Lab Results  ?Component Value Date  ? HDL 60 08/29/2021  ? ?Lab Results  ?Component Value Date  ? LDLCALC 103 (H) 08/29/2021  ? ?Lab Results  ?Component Value Date  ? TRIG 66 08/29/2021  ? ?Lab Resul

## 2022-01-01 NOTE — Assessment & Plan Note (Signed)
On Vitamin D supplement

## 2022-01-05 LAB — MICROALBUMIN / CREATININE URINE RATIO
Creatinine, Urine: 65.6 mg/dL
Microalb/Creat Ratio: 8 mg/g creat (ref 0–29)
Microalbumin, Urine: 5.2 ug/mL

## 2022-01-14 ENCOUNTER — Ambulatory Visit: Payer: PRIVATE HEALTH INSURANCE | Admitting: Orthopedic Surgery

## 2022-01-14 ENCOUNTER — Encounter: Payer: Self-pay | Admitting: Orthopedic Surgery

## 2022-01-14 ENCOUNTER — Ambulatory Visit: Payer: PRIVATE HEALTH INSURANCE

## 2022-01-14 VITALS — BP 143/82 | HR 80 | Ht 62.0 in | Wt 162.0 lb

## 2022-01-14 DIAGNOSIS — M79644 Pain in right finger(s): Secondary | ICD-10-CM

## 2022-01-14 DIAGNOSIS — M1811 Unilateral primary osteoarthritis of first carpometacarpal joint, right hand: Secondary | ICD-10-CM | POA: Diagnosis not present

## 2022-01-14 MED ORDER — MELOXICAM 7.5 MG PO TABS: 7.5000 mg | ORAL_TABLET | Freq: Every day | ORAL | 5 refills | Status: DC

## 2022-01-14 NOTE — Progress Notes (Signed)
EVALUATION AND MANAGEMENT  ? ? ?PLAN: 51 year old female with pain right thumb.  Diagnosis of CMC arthritis.  Patient splinted started on NSAIDs and referred to hand therapy ? ?Meds ordered this encounter  ?Medications  ? meloxicam (MOBIC) 7.5 MG tablet  ?  Sig: Take 1 tablet (7.5 mg total) by mouth daily.  ?  Dispense:  30 tablet  ?  Refill:  5  ? ? ? ?Chief Complaint  ?Patient presents with  ? Hand Pain  ?  RT thumb  ? ? ?51 year old female presents with pain right thumb she is right-hand dominant.  She is having trouble with routine activities such as opening cans twisting off tops.  She has a dull aching pain over the [facility] joint of the right hand and it is swollen compared to the left ? ? ?ROS ? ?No fever or chills no numbness or tingling ? ? ?Body mass index is 29.63 kg/m?. ? ?Physical Exam ? ?BP (!) 143/82   Pulse 80   Ht 5\' 2"  (1.575 m)   Wt 162 lb (73.5 kg)   LMP 05/08/2020   BMI 29.63 kg/m?  ? ?Small framed thin ectomorphic normal grooming and hygiene ? ?She is oriented x3 ? ?Her mood is normal her affect is normal as well.  No gait and station issues at this time ? ?Right hand compared to left shows prominence of the [facility] joint painful range of motion of that joint positive grind test for pinch skin is intact pulse and temperature of the hand are normal she has good sensation in the hand ? ? ?Past Medical History:  ?Diagnosis Date  ? Asthma   ? Cellulitis   ? Heart attack ([place]) 2021  ? Scabies   ? Thyroid disorder   ? ?Past Surgical History:  ?Procedure Laterality Date  ? LEFT HEART CATH AND CORONARY ANGIOGRAPHY N/A 06/09/2020  ? Procedure: LEFT HEART CATH AND CORONARY ANGIOGRAPHY;  Surgeon: Wellington Hampshire, MD;  Location: [place] [facility];  Service: Cardiovascular;  Laterality: N/A;  ? ?Social History  ? ?Tobacco Use  ? Smoking status: Every Day  ?  Packs/day: 0.25  ?  Years: 20.00  ?  Pack years: 5.00  ?  Types: Cigarettes  ?  Start date: 10/14/1987  ? Smokeless tobacco: Never  ?Vaping Use  ? Vaping  Use: Never used  ?Substance Use Topics  ? Alcohol use: Yes  ?  Comment: occ  ? Drug use: Not Currently  ?  Types: Marijuana  ?  Comment: sporadically-last used sept 2019  ? ? ? ?Assessment and Plan: ? ?Imaging: I have personally reviewed the images and my personal interpretation of the images: [facility] joint arthritis moderate ? ? ?Encounter Diagnoses  ?Name Primary?  ? Pain of right thumb   ? Arthritis of carpometacarpal [facility]) joint of right thumb Yes  ? ? ?I placed her in a splint I started her on meloxicam I sent her to hand specialist for further management ?

## 2022-01-14 NOTE — Addendum Note (Signed)
Addended by: Obie Dredge A on: 01/14/2022 10:39 AM ? ? Modules accepted: Orders ? ?

## 2022-01-22 ENCOUNTER — Ambulatory Visit: Payer: PRIVATE HEALTH INSURANCE | Admitting: Orthopedic Surgery

## 2022-01-22 ENCOUNTER — Encounter: Payer: Self-pay | Admitting: Orthopedic Surgery

## 2022-01-22 DIAGNOSIS — M1811 Unilateral primary osteoarthritis of first carpometacarpal joint, right hand: Secondary | ICD-10-CM | POA: Diagnosis not present

## 2022-01-22 MED ORDER — BETAMETHASONE SOD PHOS & ACET 6 (3-3) MG/ML IJ SUSP
3.0000 mg | INTRAMUSCULAR | Status: AC | PRN
  Administered 2022-01-22: 3 mg via INTRA_ARTICULAR

## 2022-01-22 MED ORDER — LIDOCAINE HCL 1 % IJ SOLN
0.5000 mL | INTRAMUSCULAR | Status: AC | PRN
  Administered 2022-01-22: .5 mL

## 2022-01-22 NOTE — Progress Notes (Signed)
? ?Office Visit Note ?  ?Patient: Linda Lin           ?Date of Birth: 14-Dec-1970           ?MRN: [id number] ?Visit Date: 01/22/2022 ?             ?Requested by: Carole Civil, MD ?[address] ?[place],  [place] [address] ?PCP: Lindell Spar, MD ? ? ?Assessment & Plan: ?Visit Diagnoses:  ?1. Arthritis of carpometacarpal (CMC) joint of right thumb   ? ? ?Plan: We discussed the nature of thumb CMC arthritis as well as its diagnosis, prognosis, and both conservative and surgical treatment options.  I reviewed her peers x-rays which show Eaton stage II/III arthritis of the thumb.  The STT articulation appears well-maintained.  After our discussion, the patient like to continue with bracing, oral anti-inflammatory medications, and would like a corticosteroid injection today.  We discussed the risk of steroid injections including bleeding, infection, elevated blood sugar in diabetic patients.  I can see her back in the office in a few months if she remains symptomatic. ? ?Follow-Up Instructions: No follow-ups on file.  ? ?Orders:  ?No orders of the defined types were placed in this encounter. ? ?No orders of the defined types were placed in this encounter. ? ? ? ? Procedures: ?Hand/UE Inj: R thumb CMC for osteoarthritis on 01/22/2022 11:32 AM ?Indications: pain and therapeutic ?Details: 25 G needle, fluoroscopy-guided radial approach ?Medications: 0.5 mL lidocaine 1 %; 3 mg betamethasone acetate-betamethasone sodium phosphate 6 (3-3) MG/ML ?Procedure, treatment alternatives, risks and benefits explained, specific risks discussed. Consent was given by the patient. Immediately prior to procedure a time out was called to verify the correct patient, procedure, equipment, support staff and site/side marked as required. Patient was prepped and draped in the usual sterile fashion.  ? ? ? ? ?Clinical Data: ?No additional findings. ? ? ?Subjective: ?Chief Complaint  ?Patient presents with  ? Right Hand - Follow-up   ? ? ?This is a 51 year old right-hand-dominant female who works in [employer] and presents with pain at the base of the right thumb at the [facility] joint.  Is been going on for a few years now but is gradually worsened over the last 2 or 3 months.  The pain is localized to the [facility] joint and radiates a little bit distally into the thumb.  She has no significant pain at the MCP joint.  She has no pain of the radial styloid.  Her pain is worse with activities involving tight grasping or pinching activities.  She has worn a brace for a week or so.  She was recently prescribed meloxicam but has not taken more than a few doses.  She also takes Tylenol which provides mild symptom relief.  She denies pain elsewhere in the hand.   ? ? ?Review of Systems ? ? ?Objective: ?Vital Signs: LMP 05/08/2020  ? ?Physical Exam ?Constitutional:   ?   Appearance: Normal appearance.  ?Cardiovascular:  ?   Rate and Rhythm: Normal rate.  ?   Pulses: Normal pulses.  ?Pulmonary:  ?   Effort: Pulmonary effort is normal.  ?Skin: ?   General: Skin is warm and dry.  ?   Capillary Refill: Capillary refill takes less than 2 seconds.  ?Neurological:  ?   Mental Status: She is alert.  ? ? ?Right Hand Exam  ? ?Tenderness  ?Right hand tenderness location: TTP at base of thumb at [facility] joint w/ mild  swelling. ? ?Other  ?Erythema: absent ?Sensation: normal ?Pulse: present ? ?Comments:  Pain and crepitus with CMC grind test.  No palmar abduction contracture.  No static or dynamic MP hyper-extension and no pain w/ ROM of MP joint.  No thumb triggering.  Negative Finkelstein test.  ? ? ? ? ?Specialty Comments:  ?No specialty comments available. ? ?Imaging: ?No results found. ? ? ?PMFS History: ?Patient Active Problem List  ? Diagnosis Date Noted  ? Arthritis of carpometacarpal [facility]) joint of right thumb 01/01/2022  ? Essential hypertension 08/29/2021  ? Mild persistent asthma without complication 0000000  ? Vitamin D deficiency 08/29/2021  ? CAD  (coronary artery disease) 06/10/2020  ? Hyperlipidemia 06/10/2020  ? Type 2 diabetes mellitus with complication, without long-term current use of insulin ([place]) 06/10/2020  ? NSTEMI (non-ST elevated myocardial infarction) ([place]) 06/09/2020  ? Hypothyroidism following radioiodine therapy 02/24/2018  ? Graves' disease 09/19/2017  ? Contact dermatitis 12/21/2015  ? Bilateral lower leg cellulitis, recurrent 12/19/2015  ? Lower extremity edema 12/19/2015  ? Tobacco abuse 08/30/2015  ? ?Past Medical History:  ?Diagnosis Date  ? Asthma   ? Cellulitis   ? Heart attack ([place]) 2021  ? Scabies   ? Thyroid disorder   ?  ?Family History  ?Problem Relation Age of Onset  ? Asthma Mother   ? Diabetes Father   ? Kidney failure Father   ? Hypertension Sister   ? CAD Maternal Grandmother   ? Breast cancer Cousin   ? Diabetes Mellitus II Brother   ? Hypertension Brother   ? Cancer Neg Hx   ?  ?Past Surgical History:  ?Procedure Laterality Date  ? LEFT HEART CATH AND CORONARY ANGIOGRAPHY N/A 06/09/2020  ? Procedure: LEFT HEART CATH AND CORONARY ANGIOGRAPHY;  Surgeon: Wellington Hampshire, MD;  Location: [facility];  Service: Cardiovascular;  Laterality: N/A;  ? ?Social History  ? ?Occupational History  ? Occupation: Public affairs consultant  ?  Employer: [employer]  ?Tobacco Use  ? Smoking status: Every Day  ?  Packs/day: 0.25  ?  Years: 20.00  ?  Pack years: 5.00  ?  Types: Cigarettes  ?  Start date: 10/14/1987  ? Smokeless tobacco: Never  ?Vaping Use  ? Vaping Use: Never used  ?Substance and Sexual Activity  ? Alcohol use: Yes  ?  Comment: occ  ? Drug use: Not Currently  ?  Types: Marijuana  ?  Comment: sporadically-last used sept 2019  ? Sexual activity: Not Currently  ?  Partners: Male  ?  Birth control/protection: None  ? ? ? ? ? ? ?

## 2022-01-23 ENCOUNTER — Other Ambulatory Visit: Payer: Self-pay | Admitting: Cardiology

## 2022-01-23 MED ORDER — ROSUVASTATIN CALCIUM 40 MG PO TABS: 40.0000 mg | ORAL_TABLET | Freq: Every day | ORAL | 6 refills | Status: DC

## 2022-01-24 ENCOUNTER — Telehealth: Payer: Self-pay | Admitting: Orthopedic Surgery

## 2022-01-24 NOTE — Telephone Encounter (Signed)
error 

## 2022-01-25 ENCOUNTER — Ambulatory Visit: Payer: PRIVATE HEALTH INSURANCE | Admitting: Orthopedic Surgery

## 2022-02-05 ENCOUNTER — Ambulatory Visit: Payer: PRIVATE HEALTH INSURANCE | Admitting: Orthopedic Surgery

## 2022-02-05 ENCOUNTER — Ambulatory Visit (INDEPENDENT_AMBULATORY_CARE_PROVIDER_SITE_OTHER): Payer: PRIVATE HEALTH INSURANCE

## 2022-02-05 DIAGNOSIS — M1811 Unilateral primary osteoarthritis of first carpometacarpal joint, right hand: Secondary | ICD-10-CM

## 2022-02-05 NOTE — Progress Notes (Signed)
? ?Office Visit Note ?  ?Patient: Linda Lin           ?Date of Birth: 22-Feb-1971           ?MRN: [id number] ?Visit Date: 02/05/2022 ?             ?Requested by: Anabel Halon, MD ?[address] ?[place],  [place] [address] ?PCP: Anabel Halon, MD ? ? ?Assessment & Plan: ?Visit Diagnoses:  ?1. Arthritis of carpometacarpal (CMC) joint of right thumb   ? ? ?Plan: Patient presents with continued pain at the right thumb CMC joint despite corticosteroid injection.  We again reviewed the nature of CMC arthritis as well as both conservative and surgical treatment options.  After discussion, she would like to proceed with right trapeziectomy with suspensionplasty.  We reviewed the risks of surgery which include but not limited to bleeding, infection, damage to neurovascular structures, incomplete symptom relief, need for additional surgery.  We discussed that, because she got a corticosteroid junction 2 weeks ago, we will have to wait at least 6 to 8 weeks before surgery.  She will call the office in a few months when she is ready proceed. ? ?Follow-Up Instructions: No follow-ups on file.  ? ?Orders:  ?Orders Placed This Encounter  ?Procedures  ? XR Finger Thumb Right  ? ?No orders of the defined types were placed in this encounter. ? ? ? ? Procedures: ?No procedures performed ? ? ?Clinical Data: ?No additional findings. ? ? ?Subjective: ?Chief Complaint  ?Patient presents with  ? Right Thumb - Follow-up  ?  Had CMC injection 01/22/22, states that she only got 30 minutes of relief from injection.  ? ? ?This is a 51 year old right-hand-dominant female who works at a [employer] and presents with continued pain at the base of the right thumb at the [facility] joint.  She was last seen about 2 weeks ago at which point she underwent corticosteroid injection into the thumb CMC joint.  She says she only got 30 minutes of symptom relief.  She continues to describe pain isolated at the thumb [facility] joint is worse with pulling  activity or things that involve grasping or pinching.  She still wearing a brace.  She has previously tried meloxicam.  She is interested in discussing surgical management. ? ? ?Review of Systems ? ? ?Objective: ?Vital Signs: LMP 05/08/2020  ? ?Physical Exam ?Constitutional:   ?   Appearance: Normal appearance.  ?Cardiovascular:  ?   Rate and Rhythm: Normal rate.  ?   Pulses: Normal pulses.  ?Pulmonary:  ?   Effort: Pulmonary effort is normal.  ?Skin: ?   General: Skin is warm and dry.  ?   Capillary Refill: Capillary refill takes less than 2 seconds.  ?Neurological:  ?   Mental Status: She is alert.  ? ? ?Right Hand Exam  ? ?Tenderness  ?Right hand tenderness location: TTP at base of thumb at [facility] joint. ? ?Other  ?Erythema: absent ?Sensation: normal ?Pulse: present ? ?Comments:  Pain and crepitus with CMC grind test.  No palmar abduction contracture.  No static or dynamic MP hyper-extension and no pain w/ ROM of MP joint.  No thumb triggering.  Negative Finkelstein test.  ? ? ? ? ?Specialty Comments:  ?No specialty comments available. ? ?Imaging: ?No results found. ? ? ?PMFS History: ?Patient Active Problem List  ? Diagnosis Date Noted  ? Arthritis of carpometacarpal [facility]) joint of right thumb 01/01/2022  ? Essential hypertension  08/29/2021  ? Mild persistent asthma without complication 08/29/2021  ? Vitamin D deficiency 08/29/2021  ? CAD (coronary artery disease) 06/10/2020  ? Hyperlipidemia 06/10/2020  ? Type 2 diabetes mellitus with complication, without long-term current use of insulin ([facility]) 06/10/2020  ? NSTEMI (non-ST elevated myocardial infarction) ([facility]) 06/09/2020  ? Hypothyroidism following radioiodine therapy 02/24/2018  ? Graves' disease 09/19/2017  ? Contact dermatitis 12/21/2015  ? Bilateral lower leg cellulitis, recurrent 12/19/2015  ? Lower extremity edema 12/19/2015  ? Tobacco abuse 08/30/2015  ? ?Past Medical History:  ?Diagnosis Date  ? Asthma   ? Cellulitis   ? Heart attack ([facility]) 2021  ? Scabies    ? Thyroid disorder   ?  ?Family History  ?Problem Relation Age of Onset  ? Asthma Mother   ? Diabetes Father   ? Kidney failure Father   ? Hypertension Sister   ? CAD Maternal Grandmother   ? Breast cancer Cousin   ? Diabetes Mellitus II Brother   ? Hypertension Brother   ? Cancer Neg Hx   ?  ?Past Surgical History:  ?Procedure Laterality Date  ? LEFT HEART CATH AND CORONARY ANGIOGRAPHY N/A 06/09/2020  ? Procedure: LEFT HEART CATH AND CORONARY ANGIOGRAPHY;  Surgeon: Iran Ouch, MD;  [facility];  Service: Cardiovascular;  Laterality: N/A;  ? ?Social History  ? ?Occupational History  ? Occupation: Scientist, physiological  ?  Employer: [employer]  ?Tobacco Use  ? Smoking status: Every Day  ?  Packs/day: 0.25  ?  Years: 20.00  ?  Pack years: 5.00  ?  Types: Cigarettes  ?  Start date: 10/14/1987  ? Smokeless tobacco: Never  ?Vaping Use  ? Vaping Use: Never used  ?Substance and Sexual Activity  ? Alcohol use: Yes  ?  Comment: occ  ? Drug use: Not Currently  ?  Types: Marijuana  ?  Comment: sporadically-last used sept 2019  ? Sexual activity: Not Currently  ?  Partners: Male  ?  Birth control/protection: None  ? ? ? ? ? ? ?

## 2022-02-13 ENCOUNTER — Ambulatory Visit: Payer: BC Managed Care – PPO | Admitting: Nurse Practitioner

## 2022-03-01 ENCOUNTER — Ambulatory Visit: Payer: BC Managed Care – PPO | Admitting: Nurse Practitioner

## 2022-03-26 ENCOUNTER — Other Ambulatory Visit: Payer: Self-pay | Admitting: Nurse Practitioner

## 2022-03-26 DIAGNOSIS — E890 Postprocedural hypothyroidism: Secondary | ICD-10-CM

## 2022-05-09 ENCOUNTER — Ambulatory Visit (INDEPENDENT_AMBULATORY_CARE_PROVIDER_SITE_OTHER): Payer: PRIVATE HEALTH INSURANCE | Admitting: Orthopedic Surgery

## 2022-05-09 DIAGNOSIS — M1811 Unilateral primary osteoarthritis of first carpometacarpal joint, right hand: Secondary | ICD-10-CM

## 2022-05-09 NOTE — Progress Notes (Signed)
Office Visit Note   Patient: Linda Lin           Date of Birth: March 21, 1971           MRN: [id number] Visit Date: 05/09/2022              Requested by: Anabel Halon, MD [address] [place],  [place] [address] PCP: Anabel Halon, MD   Assessment & Plan: Visit Diagnoses:  1. Arthritis of carpometacarpal (CMC) joint of right thumb     Plan: Patient presents with continued pain at the [facility] joint of the right thumb.  She got minimal symptom relief following corticosteroid injection.  We again discussed both conservative and surgical treatment options for this issue.  We reviewed the risks of surgery including bleeding, infection, damage to neurovascular structures, incomplete symptom relief, need for additional procedures.  Patient is interested in pursuing surgical management is not sure when she would like to proceed.  I can see her back again when she is ready for surgery.  Follow-Up Instructions: No follow-ups on file.   Orders:  No orders of the defined types were placed in this encounter.  No orders of the defined types were placed in this encounter.     Procedures: No procedures performed   Clinical Data: No additional findings.   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  She was last seen in May.  She underwent corticosteroid junction of the thumb at that time which only provided 30 minutes of symptom relief.  Her pain is isolated to the thumb [facility] joint is worse with activities involve pulling, grasping, or pinching.  She is been wearing a thumb spica brace.  She is try an oral anti-inflammatory medication.  She has no pain at the area of the radial styloid or the MCP joint.    Review of Systems   Objective: Vital Signs: LMP  05/08/2020   Physical Exam Constitutional:      Appearance: Normal appearance.  Cardiovascular:     Rate and Rhythm: Normal rate.     Pulses: Normal pulses.  Pulmonary:     Effort: Pulmonary effort is normal.  Skin:    General: Skin is warm and dry.     Capillary Refill: Capillary refill takes less than 2 seconds.  Neurological:     Mental Status: She is alert.     Right Hand Exam   Tenderness  Right hand tenderness location: TTP at base of thumb at [facility] joint w/ moderate swelling.  No TTP at radial styloid or MCPJ.  Tests  Finkelstein's test: negative  Other  Erythema: absent Sensation: normal Pulse: present  Comments:  Positive CMC grind test.  No static or dynamic MCP hyper-extension.  No palmar abduction contracture of thumb.       Specialty Comments:  No specialty comments available.  Imaging: No results found.   PMFS History: Patient Active Problem List   Diagnosis Date Noted   Arthritis of carpometacarpal [facility]) joint of right thumb 01/01/2022   Essential hypertension 08/29/2021   Mild persistent asthma without complication 08/29/2021   Vitamin D deficiency 08/29/2021   CAD (coronary artery disease) 06/10/2020   Hyperlipidemia 06/10/2020   Type 2 diabetes mellitus with complication, without  long-term current use of insulin ([facility]) 06/10/2020   NSTEMI (non-ST elevated myocardial infarction) ([facility]) 06/09/2020   Hypothyroidism following radioiodine therapy 02/24/2018   Graves' disease 09/19/2017   Contact dermatitis 12/21/2015   Bilateral lower leg cellulitis, recurrent 12/19/2015   Lower extremity edema 12/19/2015   Tobacco abuse 08/30/2015   Past Medical History:  Diagnosis Date   Asthma    Cellulitis    Heart attack ([facility]CC) 2021   Scabies    Thyroid disorder     Family History  Problem Relation Age of Onset   Asthma Mother    Diabetes Father    Kidney failure Father    Hypertension Sister    CAD Maternal Grandmother    Breast cancer Cousin     Diabetes Mellitus II Brother    Hypertension Brother    Cancer Neg Hx     Past Surgical History:  Procedure Laterality Date   LEFT HEART CATH AND CORONARY ANGIOGRAPHY N/A 06/09/2020   Procedure: LEFT HEART CATH AND CORONARY ANGIOGRAPHY;  Surgeon: Iran Ouch, MD;  [facility]: [facility];  Service: Cardiovascular;  Laterality: N/A;   Social History   Occupational History   Occupation: Landscape architect: [employer]  Tobacco Use   Smoking status: Every Day    Packs/day: 0.25    Years: 20.00    Total pack years: 5.00    Types: Cigarettes    Start date: 10/14/1987   Smokeless tobacco: Never  Vaping Use   Vaping Use: Never used  Substance and Sexual Activity   Alcohol use: Yes    Comment: occ   Drug use: Not Currently    Types: Marijuana    Comment: sporadically-last used sept 2019   Sexual activity: Not Currently    Partners: Male    Birth control/protection: None

## 2022-06-04 ENCOUNTER — Other Ambulatory Visit: Payer: Self-pay | Admitting: Internal Medicine

## 2022-06-04 DIAGNOSIS — J4530 Mild persistent asthma, uncomplicated: Secondary | ICD-10-CM

## 2022-07-03 ENCOUNTER — Ambulatory Visit: Payer: BC Managed Care – PPO | Admitting: Internal Medicine

## 2022-07-16 ENCOUNTER — Ambulatory Visit: Payer: BC Managed Care – PPO | Admitting: Internal Medicine

## 2022-07-31 ENCOUNTER — Encounter: Payer: Self-pay | Admitting: Internal Medicine

## 2022-07-31 ENCOUNTER — Ambulatory Visit (INDEPENDENT_AMBULATORY_CARE_PROVIDER_SITE_OTHER): Payer: Managed Care, Other (non HMO) | Admitting: Internal Medicine

## 2022-07-31 VITALS — BP 142/78 | HR 66 | Ht 62.0 in | Wt 177.6 lb

## 2022-07-31 DIAGNOSIS — M1811 Unilateral primary osteoarthritis of first carpometacarpal joint, right hand: Secondary | ICD-10-CM

## 2022-07-31 DIAGNOSIS — I10 Essential (primary) hypertension: Secondary | ICD-10-CM | POA: Diagnosis not present

## 2022-07-31 DIAGNOSIS — E890 Postprocedural hypothyroidism: Secondary | ICD-10-CM

## 2022-07-31 DIAGNOSIS — Z720 Tobacco use: Secondary | ICD-10-CM

## 2022-07-31 DIAGNOSIS — J4530 Mild persistent asthma, uncomplicated: Secondary | ICD-10-CM | POA: Diagnosis not present

## 2022-07-31 DIAGNOSIS — E118 Type 2 diabetes mellitus with unspecified complications: Secondary | ICD-10-CM | POA: Diagnosis not present

## 2022-07-31 DIAGNOSIS — I2510 Atherosclerotic heart disease of native coronary artery without angina pectoris: Secondary | ICD-10-CM

## 2022-07-31 DIAGNOSIS — E782 Mixed hyperlipidemia: Secondary | ICD-10-CM

## 2022-07-31 DIAGNOSIS — Z1211 Encounter for screening for malignant neoplasm of colon: Secondary | ICD-10-CM

## 2022-07-31 DIAGNOSIS — Z2821 Immunization not carried out because of patient refusal: Secondary | ICD-10-CM

## 2022-07-31 MED ORDER — METOPROLOL TARTRATE 25 MG PO TABS: 25.0000 mg | ORAL_TABLET | Freq: Two times a day (BID) | ORAL | 1 refills | Status: DC

## 2022-07-31 MED ORDER — LISINOPRIL 5 MG PO TABS: 5.0000 mg | ORAL_TABLET | Freq: Every day | ORAL | 1 refills | Status: DC

## 2022-07-31 MED ORDER — CELECOXIB 100 MG PO CAPS: 100.0000 mg | ORAL_CAPSULE | Freq: Two times a day (BID) | ORAL | 2 refills | Status: DC | PRN

## 2022-07-31 NOTE — Assessment & Plan Note (Signed)
Lab Results  Component Value Date   HGBA1C 5.6 08/29/2021   Was well controlled, but currently compliance is questionable On Metformin 500 mg BID Advised to follow diabetic diet On statin and ACEi F/u CMP and lipid panel Diabetic eye exam: Advised to follow up with Ophthalmology for diabetic eye exam

## 2022-07-31 NOTE — Assessment & Plan Note (Addendum)
Well controlled now with Breo  Refilled as needed albuterol

## 2022-07-31 NOTE — Assessment & Plan Note (Signed)
H/o Graves disease, s/p radio iodine ablation On levothyroxine 100 mcg QD, but takes it inconsistently Followed by Dr. Fransico Him - but has lost follow-up Check TSH and free T4

## 2022-07-31 NOTE — Assessment & Plan Note (Signed)
H/o NSTEMI, managed medically On aspirin and statin On beta-blocker - Metoprolol 37.5 mg BID, but has not been taking it as she has not seen cardiology for a long time Followed by cardiology - needs to schedule appointment

## 2022-07-31 NOTE — Assessment & Plan Note (Signed)
On statin Check lipid profile 

## 2022-07-31 NOTE — Assessment & Plan Note (Signed)
Smokes about 4-5 cigarettes/day  Asked about quitting: confirms that she currently smokes cigarettes Advise to quit smoking: Educated about QUITTING to reduce the risk of cancer, cardio and cerebrovascular disease. Assess willingness: Unwilling to quit at this time, but is working on cutting back. Assist with counseling and pharmacotherapy: Counseled for 5 minutes and literature provided. Arrange for follow up: follow up in 3 months and continue to offer help. 

## 2022-07-31 NOTE — Patient Instructions (Signed)
Please take Celebrex as prescribed.  Please continue taking other medications as prescribed.  Please continue to follow low carb diet and perform moderate exercise/walking at least 150 mins/week.

## 2022-07-31 NOTE — Assessment & Plan Note (Signed)
BP Readings from Last 1 Encounters:  07/31/22 (!) 142/78   Uncontrolled due to noncompliance On lisinopril and metoprolol, but has not been taking it Restart lisinopril 5 mg QD and metoprolol 25 mg BID Counseled for compliance with the medications Advised DASH diet and moderate exercise/walking, at least 150 mins/week

## 2022-07-31 NOTE — Progress Notes (Signed)
Established Patient Office Visit  Subjective:  Patient ID: Linda Lin, female    DOB: 1971/06/25  Age: 51 y.o. MRN: [id number]  CC:  Chief Complaint  Patient presents with   Follow-up    Follow up concerned about medication she was given by ortho, was told not to take ibuprofen but has had to for pain    HPI Linda Lin is a 51 y.o. female with past medical history of CAD, HTN, asthma, hypothyroidism and tobacco abuse who presents for f/u of her chronic medical conditions.  She complains of right Thumb pain, which is chronic.  She has seen hand surgeon for it.  She has had steroid injection, which did not help.  She was given Mobic, which also did not help with her pain.  She has been taking ibuprofen for hand pain.  She was advised to get surgery, but has not followed up with hand surgeon recently.  HTN: Her BP was elevated today.  Of note, she admits that she has been not compliant to her medications.  She has not been taking metoprolol.  She takes lisinopril inconsistently.  She denies any headache, dizziness, chest pain, dyspnea or palpitations.  Type II DM: She has not been taking metformin regularly.  She does not check her blood glucose.  Denies any polyuria or polyphagia currently.  Hypothyroidism: She has history of radioactive iodine therapy for Graves' disease.  She has been taking levothyroxine, but not on a regular basis.  She has history of very high TSH levels, but has not followed up with endocrinology recently.    Past Medical History:  Diagnosis Date   Asthma    Cellulitis    Heart attack ([place]) 2021   Scabies    Thyroid disorder     Past Surgical History:  Procedure Laterality Date   LEFT HEART CATH AND CORONARY ANGIOGRAPHY N/A 06/09/2020   Procedure: LEFT HEART CATH AND CORONARY ANGIOGRAPHY;  Surgeon: Wellington Hampshire, MD;  Location: [facility];  Service: Cardiovascular;  Laterality: N/A;    Family History  Problem Relation Age of  Onset   Asthma Mother    Diabetes Father    Kidney failure Father    Hypertension Sister    CAD Maternal Grandmother    Breast cancer Cousin    Diabetes Mellitus II Brother    Hypertension Brother    Cancer Neg Hx     Social History   Socioeconomic History   Marital status: Single    Spouse name: Not on file   Number of children: Not on file   Years of education: Not on file   Highest education level: Not on file  Occupational History   Occupation: Regulatory affairs officer: [employer]  Tobacco Use   Smoking status: Every Day    Packs/day: 0.25    Years: 20.00    Total pack years: 5.00    Types: Cigarettes    Start date: 10/14/1987   Smokeless tobacco: Never  Vaping Use   Vaping Use: Never used  Substance and Sexual Activity   Alcohol use: Yes    Comment: occ   Drug use: Not Currently    Types: Marijuana    Comment: sporadically-last used sept 2019   Sexual activity: Not Currently    Partners: Male    Birth control/protection: None  Other Topics Concern   Not on file  Social History Narrative   Pt lives w/ her sister.   Supervisor.  Social Determinants of Health   Financial Resource Strain: Not on file  Food Insecurity: Not on file  Transportation Needs: Not on file  Physical Activity: Not on file  Stress: Not on file  Social Connections: Not on file  Intimate Partner Violence: Not on file    Outpatient Medications Prior to Visit  Medication Sig Dispense Refill   acetaminophen (TYLENOL) 500 MG tablet Take 500 mg by mouth every 6 (six) hours as needed for mild pain or moderate pain.      albuterol (PROVENTIL) (2.5 MG/3ML) 0.083% nebulizer solution Take 3 mLs (2.5 mg total) by nebulization every 4 (four) hours as needed for wheezing or shortness of breath. 75 mL 2   albuterol (VENTOLIN HFA) 108 (90 Base) MCG/ACT inhaler INHALE 2 PUFFS BY MOUTH EVERY 6 HOURS AS NEEDED FOR WHEEZING OR SHORTNESS OF BREATH 9 g 0   aspirin 81 MG chewable tablet Chew 1  tablet (81 mg total) by mouth daily.     fluticasone furoate-vilanterol (BREO ELLIPTA) 100-25 MCG/ACT AEPB Inhale 1 puff into the lungs daily. 1 each 11   levothyroxine (SYNTHROID) 100 MCG tablet TAKE 1 TABLET BY MOUTH ONCE DAILY BEFORE BREAKFAST 90 tablet 0   metFORMIN (GLUCOPHAGE) 500 MG tablet Take 1 tablet (500 mg total) by mouth 2 (two) times daily with a meal. 180 tablet 1   nitroGLYCERIN (NITROSTAT) 0.4 MG SL tablet Place 1 tablet (0.4 mg total) under the tongue every 5 (five) minutes x 3 doses as needed for chest pain. 25 tablet 2   rosuvastatin (CRESTOR) 40 MG tablet Take 1 tablet (40 mg total) by mouth daily. 30 tablet 6   Vitamin D, Ergocalciferol, (DRISDOL) 1.25 MG (50000 UNIT) CAPS capsule Take 1 capsule (50,000 Units total) by mouth every 7 (seven) days. 12 capsule 1   lisinopril (ZESTRIL) 5 MG tablet Take 1 tablet (5 mg total) by mouth daily. 90 tablet 1   meloxicam (MOBIC) 7.5 MG tablet Take 1 tablet (7.5 mg total) by mouth daily. 30 tablet 5   metoprolol tartrate (LOPRESSOR) 25 MG tablet Take 1.5 tablets (37.5 mg total) by mouth 2 (two) times daily. 270 tablet 1   No facility-administered medications prior to visit.    Allergies  Allergen Reactions   Other Hives    shellfish   Cephalexin Rash   Doxycycline Rash    ROS Review of Systems  Constitutional:  Negative for chills and fever.  HENT:  Negative for congestion, sinus pressure, sinus pain and sore throat.   Eyes:  Negative for pain and discharge.  Respiratory:  Negative for cough and shortness of breath.   Cardiovascular:  Negative for chest pain and palpitations.  Gastrointestinal:  Negative for abdominal pain, diarrhea, nausea and vomiting.  Endocrine: Negative for polydipsia and polyuria.  Genitourinary:  Negative for dysuria and hematuria.  Musculoskeletal:  Positive for arthralgias (Right hand). Negative for neck pain and neck stiffness.  Skin:  Negative for rash.  Neurological:  Negative for dizziness and  weakness.  Psychiatric/Behavioral:  Negative for agitation and behavioral problems. The patient is nervous/anxious.       Objective:    Physical Exam Vitals reviewed.  Constitutional:      General: She is not in acute distress.    Appearance: She is not diaphoretic.  HENT:     Head: Normocephalic and atraumatic.     Nose: Nose normal.     Mouth/Throat:     Mouth: Mucous membranes are moist.  Eyes:  General: No scleral icterus.    Extraocular Movements: Extraocular movements intact.  Cardiovascular:     Rate and Rhythm: Normal rate and regular rhythm.     Pulses: Normal pulses.     Heart sounds: Normal heart sounds. No murmur heard. Pulmonary:     Breath sounds: Normal breath sounds. No wheezing or rales.  Abdominal:     Palpations: Abdomen is soft.     Tenderness: There is no abdominal tenderness.  Musculoskeletal:        General: Tenderness (CMC joint of first digit of right hand, with mild swelling) present.     Cervical back: Neck supple. No tenderness.     Right lower leg: No edema.     Left lower leg: No edema.  Skin:    General: Skin is warm.     Findings: No rash.  Neurological:     General: No focal deficit present.     Mental Status: She is alert and oriented to person, place, and time.     Sensory: No sensory deficit.     Motor: No weakness.  Psychiatric:        Mood and Affect: Mood normal.        Behavior: Behavior normal.     BP (!) 142/78 (BP Location: Left Arm, Patient Position: Sitting, Cuff Size: Normal)   Pulse 66   Ht _0  (1.575 m)   Wt 177 lb 9.6 oz (80.6 kg)   LMP 05/08/2020   SpO2 99%   BMI 32.48 kg/m  Wt Readings from Last 3 Encounters:  07/31/22 177 lb 9.6 oz (80.6 kg)  01/14/22 162 lb (73.5 kg)  01/01/22 163 lb 12.8 oz (74.3 kg)    Lab Results  Component Value Date   TSH 28.107 (H) 11/08/2021   Lab Results  Component Value Date   WBC 8.0 08/29/2021   HGB 9.9 (L) 08/29/2021   HCT 31.2 (L) 08/29/2021   MCV 84  08/29/2021   PLT 411 08/29/2021   Lab Results  Component Value Date   NA 135 08/29/2021   K 4.9 08/29/2021   CO2 23 08/29/2021   GLUCOSE 119 (H) 08/29/2021   BUN 10 08/29/2021   CREATININE 0.84 08/29/2021   BILITOT 0.4 08/29/2021   ALKPHOS 229 (H) 08/29/2021   AST 36 08/29/2021   ALT 19 08/29/2021   PROT 8.4 08/29/2021   ALBUMIN 4.0 08/29/2021   CALCIUM 9.5 08/29/2021   ANIONGAP 11 11/25/2020   EGFR 84 08/29/2021   Lab Results  Component Value Date   CHOL 176 08/29/2021   Lab Results  Component Value Date   HDL 60 08/29/2021   Lab Results  Component Value Date   LDLCALC 103 (H) 08/29/2021   Lab Results  Component Value Date   TRIG 66 08/29/2021   Lab Results  Component Value Date   CHOLHDL 2.9 08/29/2021   Lab Results  Component Value Date   HGBA1C 5.6 08/29/2021      Assessment & Plan:   Problem List Items Addressed This Visit       Cardiovascular and Mediastinum   CAD (coronary artery disease) - Primary    H/o NSTEMI, managed medically On aspirin and statin On beta-blocker - Metoprolol 37.5 mg BID, but has not been taking it as she has not seen cardiology for a long time Followed by cardiology - needs to schedule appointment      Relevant Medications   lisinopril (ZESTRIL) 5 MG tablet   metoprolol tartrate (LOPRESSOR)  25 MG tablet   Essential hypertension    BP Readings from Last 1 Encounters:  07/31/22 (!) 142/78  Uncontrolled due to noncompliance On lisinopril and metoprolol, but has not been taking it Restart lisinopril 5 mg QD and metoprolol 25 mg BID Counseled for compliance with the medications Advised DASH diet and moderate exercise/walking, at least 150 mins/week       Relevant Medications   lisinopril (ZESTRIL) 5 MG tablet   metoprolol tartrate (LOPRESSOR) 25 MG tablet     Respiratory   Mild persistent asthma without complication    Well controlled now with Breo  Refilled as needed albuterol        Endocrine    Hypothyroidism following radioiodine therapy    H/o Graves disease, s/p radio iodine ablation On levothyroxine 100 mcg QD, but takes it inconsistently Followed by Dr. Dorris Fetch - but has lost follow-up Check TSH and free T4      Relevant Medications   metoprolol tartrate (LOPRESSOR) 25 MG tablet   Other Relevant Orders   TSH + free T4   Type 2 diabetes mellitus with complication, without long-term current use of insulin ([place])    Lab Results  Component Value Date   HGBA1C 5.6 08/29/2021  Was well controlled, but currently compliance is questionable On Metformin 500 mg BID Advised to follow diabetic diet On statin and ACEi F/u CMP and lipid panel Diabetic eye exam: Advised to follow up with Ophthalmology for diabetic eye exam      Relevant Medications   lisinopril (ZESTRIL) 5 MG tablet   Other Relevant Orders   CMP14+EGFR   Hemoglobin A1c     Musculoskeletal and Integument   Arthritis of carpometacarpal (CMC) joint of right thumb    Right thumb area pain, likely due to arthritis of CMC joint Advised to use Voltaren gel as needed Tylenol as needed Referred to orthopedic surgery -had steroid injection, but did not help, was advised for surgery Did not get relief from Mobic, switched to Celebrex - advised to use NSAIDs cautiously as she has history of CAD      Relevant Medications   celecoxib (CELEBREX) 100 MG capsule   Other Relevant Orders   Ambulatory referral to Orthopedic Surgery     Other   Tobacco abuse    Smokes about 4-5 cigarettes/day  Asked about quitting: confirms that she currently smokes cigarettes Advise to quit smoking: Educated about QUITTING to reduce the risk of cancer, cardio and cerebrovascular disease. Assess willingness: Unwilling to quit at this time, but is working on cutting back. Assist with counseling and pharmacotherapy: Counseled for 5 minutes and literature provided. Arrange for follow up: follow up in 3 months and continue to offer help.       Hyperlipidemia    On statin Check lipid profile      Relevant Medications   lisinopril (ZESTRIL) 5 MG tablet   metoprolol tartrate (LOPRESSOR) 25 MG tablet   Other Visit Diagnoses     Colon cancer screening       Relevant Orders   Cologuard   Refused influenza vaccine           Meds ordered this encounter  Medications   celecoxib (CELEBREX) 100 MG capsule    Sig: Take 1 capsule (100 mg total) by mouth 2 (two) times daily as needed for mild pain or moderate pain.    Dispense:  30 capsule    Refill:  2   lisinopril (ZESTRIL) 5 MG tablet  Sig: Take 1 tablet (5 mg total) by mouth daily.    Dispense:  90 tablet    Refill:  1   metoprolol tartrate (LOPRESSOR) 25 MG tablet    Sig: Take 1 tablet (25 mg total) by mouth 2 (two) times daily.    Dispense:  180 tablet    Refill:  1    Follow-up: Return in about 3 months (around 10/31/2022) for Annual physical.    Lindell Spar, MD

## 2022-07-31 NOTE — Assessment & Plan Note (Addendum)
Right thumb area pain, likely due to arthritis of CMC joint Advised to use Voltaren gel as needed Tylenol as needed Referred to orthopedic surgery -had steroid injection, but did not help, was advised for surgery Did not get relief from Mobic, switched to Celebrex - advised to use NSAIDs cautiously as she has history of CAD

## 2022-08-01 ENCOUNTER — Other Ambulatory Visit: Payer: Self-pay | Admitting: Internal Medicine

## 2022-08-01 DIAGNOSIS — E890 Postprocedural hypothyroidism: Secondary | ICD-10-CM

## 2022-08-01 DIAGNOSIS — E118 Type 2 diabetes mellitus with unspecified complications: Secondary | ICD-10-CM

## 2022-08-01 LAB — CMP14+EGFR
ALT: 21 IU/L (ref 0–32)
AST: 29 IU/L (ref 0–40)
Albumin/Globulin Ratio: 1.2 (ref 1.2–2.2)
Albumin: 4.1 g/dL (ref 3.8–4.9)
Alkaline Phosphatase: 144 IU/L — ABNORMAL HIGH (ref 44–121)
BUN/Creatinine Ratio: 11 (ref 9–23)
BUN: 11 mg/dL (ref 6–24)
Bilirubin Total: 0.3 mg/dL (ref 0.0–1.2)
CO2: 27 mmol/L (ref 20–29)
Calcium: 9.5 mg/dL (ref 8.7–10.2)
Chloride: 103 mmol/L (ref 96–106)
Creatinine, Ser: 1.01 mg/dL — ABNORMAL HIGH (ref 0.57–1.00)
Globulin, Total: 3.3 g/dL (ref 1.5–4.5)
Glucose: 122 mg/dL — ABNORMAL HIGH (ref 70–99)
Potassium: 4 mmol/L (ref 3.5–5.2)
Sodium: 142 mmol/L (ref 134–144)
Total Protein: 7.4 g/dL (ref 6.0–8.5)
eGFR: 67 mL/min/{1.73_m2} (ref >59)

## 2022-08-01 LAB — TSH+FREE T4
Free T4: 0.54 ng/dL — ABNORMAL LOW (ref 0.82–1.77)
TSH: 43.3 u[IU]/mL — ABNORMAL HIGH (ref 0.450–4.500)

## 2022-08-01 LAB — HEMOGLOBIN A1C
Est. average glucose Bld gHb Est-mCnc: 140 mg/dL
Hgb A1c MFr Bld: 6.5 % — ABNORMAL HIGH (ref 4.8–5.6)

## 2022-08-01 MED ORDER — LEVOTHYROXINE SODIUM 100 MCG PO TABS: 100.0000 ug | ORAL_TABLET | Freq: Every day | ORAL | 0 refills | Status: DC

## 2022-08-01 MED ORDER — METFORMIN HCL 500 MG PO TABS: 500.0000 mg | ORAL_TABLET | Freq: Two times a day (BID) | ORAL | 1 refills | Status: DC

## 2022-08-19 ENCOUNTER — Other Ambulatory Visit: Payer: Self-pay | Admitting: Internal Medicine

## 2022-08-19 DIAGNOSIS — J4530 Mild persistent asthma, uncomplicated: Secondary | ICD-10-CM

## 2022-08-19 MED ORDER — FLUTICASONE-SALMETEROL 100-50 MCG/ACT IN AEPB: 1.0000 | INHALATION_SPRAY | Freq: Two times a day (BID) | RESPIRATORY_TRACT | 3 refills | Status: DC

## 2022-09-11 ENCOUNTER — Telehealth: Payer: Self-pay | Admitting: *Deleted

## 2022-09-11 NOTE — Telephone Encounter (Signed)
   Pre-operative Risk Assessment    Patient Name: Linda Lin  DOB: [birth year] MRN: [id number]      Request for Surgical Clearance    Procedure:   RIGHT THUMB TRAPEZIECTOMY  WITH SUSPENSION PLASTY   Date of Surgery:  Clearance TBD                                 Surgeon:  DR. CHARLES BENFIELD Surgeon's Group or Practice Name:  [facility] Phone number:  [phone] ATTN: [facility] Fax number:  [phone]   Type of Clearance Requested:   - Medical ; ASA    Type of Anesthesia:  General  or MAC WITH REGIONAL BLOCK   Additional requests/questions:    Jiles Prows   09/11/2022, 5:26 PM

## 2022-09-12 ENCOUNTER — Telehealth: Payer: Self-pay | Admitting: *Deleted

## 2022-09-12 NOTE — Telephone Encounter (Signed)
Pt is returning call. Transferred to Jennifer, RMA.  

## 2022-09-12 NOTE — Telephone Encounter (Signed)
Pt has been scheduled for a tele visit, 09/19/22 9:20.  Consent on file / medications reconciled.

## 2022-09-12 NOTE — Telephone Encounter (Signed)
   Name: Linda Lin  DOB: 11-Mar-1971  MRN: [id number]  Primary Cardiologist: Dina Rich, MD   Preoperative team, please contact this patient and set up a phone call appointment for further preoperative risk assessment. Please obtain consent and complete medication review. Thank you for your help.  I confirm that guidance regarding antiplatelet and oral anticoagulation therapy has been completed and, if necessary, noted below.  If necessary her aspirin may be held for 5 to 7 days prior to her surgery.  Please resume as soon as hemostasis is achieved.   Ronney Asters, NP 09/12/2022, 11:58 AM [facility]

## 2022-09-12 NOTE — Telephone Encounter (Signed)
1st attempt to reach pt regarding surgical clearance and the need for a tele visit.  Left a message for pt to call back and ask for the preop team. 

## 2022-09-12 NOTE — Telephone Encounter (Signed)
Pt has been scheduled for a tele visit, 09/19/22 9:20.  Consent on file / medications reconciled.    Patient Consent for Virtual Visit        Linda Lin has provided verbal consent on 09/12/2022 for a virtual visit (video or telephone).   CONSENT FOR VIRTUAL VISIT FOR:  Linda Lin  By participating in this virtual visit I agree to the following:  I hereby voluntarily request, consent and authorize [facility] and its employed or contracted physicians, physician assistants, nurse practitioners or other licensed health care professionals (the Practitioner), to provide me with telemedicine health care services (the "Services") as deemed necessary by the treating Practitioner. I acknowledge and consent to receive the Services by the Practitioner via telemedicine. I understand that the telemedicine visit will involve communicating with the Practitioner through live audiovisual communication technology and the disclosure of certain medical information by electronic transmission. I acknowledge that I have been given the opportunity to request an in-person assessment or other available alternative prior to the telemedicine visit and am voluntarily participating in the telemedicine visit.  I understand that I have the right to withhold or withdraw my consent to the use of telemedicine in the course of my care at any time, without affecting my right to future care or treatment, and that the Practitioner or I may terminate the telemedicine visit at any time. I understand that I have the right to inspect all information obtained and/or recorded in the course of the telemedicine visit and may receive copies of available information for a reasonable fee.  I understand that some of the potential risks of receiving the Services via telemedicine include:  Delay or interruption in medical evaluation due to technological equipment failure or disruption; Information transmitted may not be  sufficient (e.g. poor resolution of images) to allow for appropriate medical decision making by the Practitioner; and/or  In rare instances, security protocols could fail, causing a breach of personal health information.  Furthermore, I acknowledge that it is my responsibility to provide information about my medical history, conditions and care that is complete and accurate to the best of my ability. I acknowledge that Practitioner's advice, recommendations, and/or decision may be based on factors not within their control, such as incomplete or inaccurate data provided by me or distortions of diagnostic images or specimens that may result from electronic transmissions. I understand that the practice of medicine is not an exact science and that Practitioner makes no warranties or guarantees regarding treatment outcomes. I acknowledge that a copy of this consent can be made available to me via my patient portal [facility] MyChart), or I can request a printed copy by calling the office of [facility].    I understand that my insurance will be billed for this visit.   I have read or had this consent read to me. I understand the contents of this consent, which adequately explains the benefits and risks of the Services being provided via telemedicine.  I have been provided ample opportunity to ask questions regarding this consent and the Services and have had my questions answered to my satisfaction. I give my informed consent for the services to be provided through the use of telemedicine in my medical care

## 2022-09-19 ENCOUNTER — Ambulatory Visit: Payer: Managed Care, Other (non HMO) | Attending: Cardiology | Admitting: Nurse Practitioner

## 2022-09-19 ENCOUNTER — Encounter: Payer: Self-pay | Admitting: Nurse Practitioner

## 2022-09-19 DIAGNOSIS — Z01810 Encounter for preprocedural cardiovascular examination: Secondary | ICD-10-CM

## 2022-09-19 NOTE — Progress Notes (Signed)
Virtual Visit via Telephone Note   Because of Linda Lin's co-morbid illnesses, she is at least at moderate risk for complications without adequate follow up.  This format is felt to be most appropriate for this patient at this time.  The patient did not have access to video technology/had technical difficulties with video requiring transitioning to audio format only (telephone).  All issues noted in this document were discussed and addressed.  No physical exam could be performed with this format.  Please refer to the patient's chart for her consent to telehealth for [facility].  Evaluation Performed:  Preoperative cardiovascular risk assessment _____________   Date:  09/19/2022   Patient ID:  Linda Lin, DOB [birth year], MRN [id number] Patient Location:  Home Provider location:   Office  Primary Care Provider:  Anabel Halon, MD Primary Cardiologist:  Dina Rich, MD  Chief Complaint / Patient Profile   51 y.o. y/o female with a h/o CAD, hypertension, hyperlipidemia, type 2 diabetes, hypothyroidism, and asthma who is pending right thumb trapeziectomy with suspension plasty with Dr. Waylan Rocher of [facility] and presents today for telephonic preoperative cardiovascular risk assessment.  History of Present Illness    Linda Lin is a 51 y.o. female who presents via audio/video conferencing for a telehealth visit today.  Pt was last seen in cardiology clinic on 10/03/2021 by Dr. Wyline Mood.  At that time Linda Lin was doing well.  The patient is now pending procedure as outlined above. Since her last visit, she has done well from a cardiac standpoint.   She denies chest pain, palpitations, dyspnea, pnd, orthopnea, n, v, dizziness, syncope, edema, weight gain, or early satiety. All other systems reviewed and are otherwise negative except as noted above.   Past Medical History    Past Medical History:  Diagnosis Date   Asthma     Cellulitis    Heart attack ([facility]) 2021   Scabies    Thyroid disorder    Past Surgical History:  Procedure Laterality Date   LEFT HEART CATH AND CORONARY ANGIOGRAPHY N/A 06/09/2020   Procedure: LEFT HEART CATH AND CORONARY ANGIOGRAPHY;  Surgeon: Iran Ouch, MD;  Location: [facility];  Service: Cardiovascular;  Laterality: N/A;    Allergies  Allergies  Allergen Reactions   Other Hives    shellfish   Cephalexin Rash   Doxycycline Rash    Home Medications    Prior to Admission medications   Medication Sig Start Date End Date Taking? Authorizing Provider  acetaminophen (TYLENOL) 500 MG tablet Take 500 mg by mouth every 6 (six) hours as needed for mild pain or moderate pain.     [provider]  albuterol (PROVENTIL) (2.5 MG/3ML) 0.083% nebulizer solution Take 3 mLs (2.5 mg total) by nebulization every 4 (four) hours as needed for wheezing or shortness of breath. 11/25/20 07/31/22  Erick Blinks, MD  albuterol (VENTOLIN HFA) 108 (90 Base) MCG/ACT inhaler INHALE 2 PUFFS BY MOUTH EVERY 6 HOURS AS NEEDED FOR WHEEZING OR SHORTNESS OF BREATH 06/04/22   Anabel Halon, MD  aspirin 81 MG chewable tablet Chew 1 tablet (81 mg total) by mouth daily. 06/10/20   Corrin Parker, PA-C  celecoxib (CELEBREX) 100 MG capsule Take 1 capsule (100 mg total) by mouth 2 (two) times daily as needed for mild pain or moderate pain. 07/31/22   Anabel Halon, MD  fluticasone-salmeterol (ADVAIR) 100-50 MCG/ACT AEPB Inhale 1 puff into the lungs 2 (two) times  daily. 08/19/22   Anabel Halon, MD  ibuprofen (ADVIL) 200 MG tablet Take 200 mg by mouth every 6 (six) hours as needed for headache.    [provider]  levothyroxine (SYNTHROID) 100 MCG tablet Take 1 tablet (100 mcg total) by mouth daily before breakfast. 08/01/22   Anabel Halon, MD  lisinopril (ZESTRIL) 5 MG tablet Take 1 tablet (5 mg total) by mouth daily. 07/31/22 01/27/23  Anabel Halon, MD  metFORMIN (GLUCOPHAGE) 500 MG  tablet Take 1 tablet (500 mg total) by mouth 2 (two) times daily with a meal. 08/01/22   Anabel Halon, MD  metoprolol tartrate (LOPRESSOR) 25 MG tablet Take 1 tablet (25 mg total) by mouth 2 (two) times daily. 07/31/22   Anabel Halon, MD  rosuvastatin (CRESTOR) 40 MG tablet Take 1 tablet (40 mg total) by mouth daily. 01/23/22 07/31/22  Antoine Poche, MD  Vitamin D, Ergocalciferol, (DRISDOL) 1.25 MG (50000 UNIT) CAPS capsule Take 1 capsule (50,000 Units total) by mouth every 7 (seven) days. 01/01/22   Anabel Halon, MD    Physical Exam    Vital Signs:  [name] does not have vital signs available for review today.  Given telephonic nature of communication, physical exam is limited. AAOx3. NAD. Normal affect.  Speech and respirations are unlabored.  Accessory Clinical Findings    None  Assessment & Plan    1.  Preoperative Cardiovascular Risk Assessment:  According to the Revised Cardiac Risk Index (RCRI), her Perioperative Risk of Major Cardiac Event is (%): 0.9. Her Functional Capacity in METs is: 8.97 according to the Duke Activity Status Index (DASI).Therefore, based on ACC/AHA guidelines, patient would be at acceptable risk for the planned procedure without further cardiovascular testing.   The patient was advised that if she develops new symptoms prior to surgery to contact our office to arrange for a follow-up visit, and she verbalized understanding.  If necessary her aspirin may be held for 5 to 7 days prior to her surgery.  Please resume as soon as hemostasis is achieved.    A copy of this note will be routed to requesting surgeon.  Time:   Today, I have spent 4 minutes with the patient with telehealth technology discussing medical history, symptoms, and management plan.     Joylene Grapes, NP  09/19/2022, 9:30 AM

## 2022-10-10 ENCOUNTER — Other Ambulatory Visit: Payer: Self-pay

## 2022-10-10 ENCOUNTER — Encounter (HOSPITAL_BASED_OUTPATIENT_CLINIC_OR_DEPARTMENT_OTHER): Payer: Self-pay | Admitting: Orthopedic Surgery

## 2022-10-15 ENCOUNTER — Encounter (HOSPITAL_BASED_OUTPATIENT_CLINIC_OR_DEPARTMENT_OTHER): Payer: Self-pay | Admitting: Orthopedic Surgery

## 2022-10-15 ENCOUNTER — Encounter (HOSPITAL_BASED_OUTPATIENT_CLINIC_OR_DEPARTMENT_OTHER)
Admission: RE | Admit: 2022-10-15 | Discharge: 2022-10-15 | Disposition: A | Discharge status: Home / Self Care | Payer: Managed Care, Other (non HMO) | Source: Ambulatory Visit | Attending: Orthopedic Surgery

## 2022-10-15 DIAGNOSIS — F17210 Nicotine dependence, cigarettes, uncomplicated: Secondary | ICD-10-CM | POA: Diagnosis not present

## 2022-10-15 DIAGNOSIS — Z8249 Family history of ischemic heart disease and other diseases of the circulatory system: Secondary | ICD-10-CM | POA: Diagnosis not present

## 2022-10-15 DIAGNOSIS — I10 Essential (primary) hypertension: Secondary | ICD-10-CM | POA: Diagnosis not present

## 2022-10-15 DIAGNOSIS — E669 Obesity, unspecified: Secondary | ICD-10-CM | POA: Diagnosis not present

## 2022-10-15 DIAGNOSIS — Z01812 Encounter for preprocedural laboratory examination: Secondary | ICD-10-CM | POA: Insufficient documentation

## 2022-10-15 DIAGNOSIS — E119 Type 2 diabetes mellitus without complications: Secondary | ICD-10-CM | POA: Diagnosis not present

## 2022-10-15 DIAGNOSIS — E785 Hyperlipidemia, unspecified: Secondary | ICD-10-CM | POA: Diagnosis not present

## 2022-10-15 DIAGNOSIS — Z833 Family history of diabetes mellitus: Secondary | ICD-10-CM | POA: Diagnosis not present

## 2022-10-15 DIAGNOSIS — E039 Hypothyroidism, unspecified: Secondary | ICD-10-CM | POA: Diagnosis not present

## 2022-10-15 DIAGNOSIS — Z7984 Long term (current) use of oral hypoglycemic drugs: Secondary | ICD-10-CM | POA: Diagnosis not present

## 2022-10-15 DIAGNOSIS — J45909 Unspecified asthma, uncomplicated: Secondary | ICD-10-CM | POA: Diagnosis not present

## 2022-10-15 DIAGNOSIS — I252 Old myocardial infarction: Secondary | ICD-10-CM | POA: Diagnosis not present

## 2022-10-15 DIAGNOSIS — I2510 Atherosclerotic heart disease of native coronary artery without angina pectoris: Secondary | ICD-10-CM | POA: Diagnosis not present

## 2022-10-15 DIAGNOSIS — M1811 Unilateral primary osteoarthritis of first carpometacarpal joint, right hand: Secondary | ICD-10-CM | POA: Diagnosis not present

## 2022-10-15 DIAGNOSIS — Z6832 Body mass index (BMI) 32.0-32.9, adult: Secondary | ICD-10-CM | POA: Diagnosis not present

## 2022-10-15 LAB — BASIC METABOLIC PANEL
Anion gap: 9 (ref 5–15)
BUN: 17 mg/dL (ref 6–20)
CO2: 30 mmol/L (ref 22–32)
Calcium: 10 mg/dL (ref 8.9–10.3)
Chloride: 101 mmol/L (ref 98–111)
Creatinine, Ser: 1.12 mg/dL — ABNORMAL HIGH (ref 0.44–1.00)
GFR, Estimated: 59 mL/min — ABNORMAL LOW (ref >60)
Glucose, Bld: 86 mg/dL (ref 70–99)
Potassium: 4.3 mmol/L (ref 3.5–5.1)
Sodium: 140 mmol/L (ref 135–145)

## 2022-10-15 NOTE — Progress Notes (Signed)

## 2022-10-15 NOTE — Anesthesia Preprocedure Evaluation (Signed)
Anesthesia Evaluation  Patient identified by MRN, date of birth, ID band Patient awake    Reviewed: Allergy & Precautions, NPO status , Patient's Chart, lab work & pertinent test results, reviewed documented beta blocker date and time   Airway Mallampati: II  TM Distance: >3 FB Neck ROM: Full    Dental no notable dental hx. (+) Teeth Intact, Caps, Dental Advisory Given   Pulmonary asthma , Current Smoker   Pulmonary exam normal breath sounds clear to auscultation       Cardiovascular hypertension, Pt. on medications and Pt. on home beta blockers + CAD and + Past MI  Normal cardiovascular exam Rhythm:Regular Rate:Normal  NSTEMI 2021  Echo 06/09/20 1. Left ventricular ejection fraction, by estimation, is 55 to 60%. The  left ventricle has normal function. The left ventricle has no regional  wall motion abnormalities. Left ventricular diastolic parameters are  consistent with Grade I diastolic  dysfunction (impaired relaxation).   2. Right ventricular systolic function is normal. The right ventricular  size is normal. Tricuspid regurgitation signal is inadequate for assessing  PA pressure.   3. The mitral valve is grossly normal. No evidence of mitral valve  regurgitation. No evidence of mitral stenosis.   4. The aortic valve is grossly normal. Aortic valve regurgitation is not  visualized. No aortic stenosis is present.   5. The inferior vena cava is normal in size with greater than 50%  respiratory variability, suggesting right atrial pressure of 3 mmHg.   Cardiac Cath 06/09/20  The left ventricular systolic function is normal.  LV end diastolic pressure is normal.  The left ventricular ejection fraction is 55-65% by visual estimate.  1st Diag-1 lesion is 40% stenosed.  1st Diag-2 lesion is 70% stenosed.  Prox RCA lesion is 50% stenosed.  RPDA lesion is 99% stenosed.   1.  Severe one-vessel coronary artery disease  with thrombotic subtotal occlusion of ostial right PDA with TIMI I flow supplying a relatively small area.  This is the likely culprit for myocardial infarction.  There is moderate stenosis in the proximal right coronary artery and there is also 70% stenosis in the distal first diagonal. 2.  Normal LV systolic function with mild inferior wall hypokinesis.  High normal left ventricular end-diastolic pressure at 12 mmHg     Neuro/Psych negative neurological ROS  negative psych ROS   GI/Hepatic negative GI ROS, Neg liver ROS,,,  Endo/Other  diabetes, Well Controlled, Type 2, Oral Hypoglycemic AgentsHypothyroidism  Hyperlipidemia Obesity  Renal/GU negative Renal ROS  negative genitourinary   Musculoskeletal  (+) Arthritis , Rheumatoid disorders,  Arthritis Carpometacarpal joint right thumb   Abdominal  (+) + obese  Peds  Hematology   Anesthesia Other Findings   Reproductive/Obstetrics                              Anesthesia Physical Anesthesia Plan  ASA: 3  Anesthesia Plan: MAC and Regional   Post-op Pain Management: Regional block* and Minimal or no pain anticipated   Induction: Intravenous  PONV Risk Score and Plan: 2 and Treatment may vary due to age or medical condition, Propofol infusion and Ondansetron  Airway Management Planned: Natural Airway, Nasal Cannula and Simple Face Mask  Additional Equipment: None  Intra-op Plan:   Post-operative Plan:   Informed Consent: I have reviewed the patients History and Physical, chart, labs and discussed the procedure including the risks, benefits and alternatives for the proposed anesthesia  with the patient or authorized representative who has indicated his/her understanding and acceptance.     Dental advisory given  Plan Discussed with: Anesthesiologist and CRNA  Anesthesia Plan Comments:         Anesthesia Quick Evaluation

## 2022-10-16 ENCOUNTER — Encounter (HOSPITAL_BASED_OUTPATIENT_CLINIC_OR_DEPARTMENT_OTHER)
Admission: RE | Disposition: A | Discharge status: Home / Self Care | Payer: Self-pay | Source: Home / Self Care | Attending: Orthopedic Surgery

## 2022-10-16 ENCOUNTER — Ambulatory Visit (HOSPITAL_BASED_OUTPATIENT_CLINIC_OR_DEPARTMENT_OTHER): Payer: Managed Care, Other (non HMO)

## 2022-10-16 ENCOUNTER — Ambulatory Visit (HOSPITAL_BASED_OUTPATIENT_CLINIC_OR_DEPARTMENT_OTHER): Payer: Managed Care, Other (non HMO) | Admitting: Anesthesiology

## 2022-10-16 ENCOUNTER — Ambulatory Visit (HOSPITAL_BASED_OUTPATIENT_CLINIC_OR_DEPARTMENT_OTHER)
Admission: RE | Admit: 2022-10-16 | Discharge: 2022-10-16 | Disposition: A | Discharge status: Home / Self Care | Payer: Managed Care, Other (non HMO) | Attending: Orthopedic Surgery | Admitting: Orthopedic Surgery

## 2022-10-16 ENCOUNTER — Other Ambulatory Visit: Payer: Self-pay

## 2022-10-16 ENCOUNTER — Encounter (HOSPITAL_BASED_OUTPATIENT_CLINIC_OR_DEPARTMENT_OTHER): Payer: Self-pay | Admitting: Orthopedic Surgery

## 2022-10-16 DIAGNOSIS — E119 Type 2 diabetes mellitus without complications: Secondary | ICD-10-CM | POA: Insufficient documentation

## 2022-10-16 DIAGNOSIS — Z6832 Body mass index (BMI) 32.0-32.9, adult: Secondary | ICD-10-CM | POA: Insufficient documentation

## 2022-10-16 DIAGNOSIS — E785 Hyperlipidemia, unspecified: Secondary | ICD-10-CM | POA: Insufficient documentation

## 2022-10-16 DIAGNOSIS — E669 Obesity, unspecified: Secondary | ICD-10-CM | POA: Insufficient documentation

## 2022-10-16 DIAGNOSIS — Z7984 Long term (current) use of oral hypoglycemic drugs: Secondary | ICD-10-CM | POA: Insufficient documentation

## 2022-10-16 DIAGNOSIS — I10 Essential (primary) hypertension: Secondary | ICD-10-CM

## 2022-10-16 DIAGNOSIS — M1811 Unilateral primary osteoarthritis of first carpometacarpal joint, right hand: Secondary | ICD-10-CM | POA: Diagnosis not present

## 2022-10-16 DIAGNOSIS — I252 Old myocardial infarction: Secondary | ICD-10-CM | POA: Diagnosis not present

## 2022-10-16 DIAGNOSIS — F17210 Nicotine dependence, cigarettes, uncomplicated: Secondary | ICD-10-CM | POA: Insufficient documentation

## 2022-10-16 DIAGNOSIS — Z01818 Encounter for other preprocedural examination: Secondary | ICD-10-CM

## 2022-10-16 DIAGNOSIS — J45909 Unspecified asthma, uncomplicated: Secondary | ICD-10-CM | POA: Insufficient documentation

## 2022-10-16 DIAGNOSIS — Z8249 Family history of ischemic heart disease and other diseases of the circulatory system: Secondary | ICD-10-CM | POA: Insufficient documentation

## 2022-10-16 DIAGNOSIS — I2510 Atherosclerotic heart disease of native coronary artery without angina pectoris: Secondary | ICD-10-CM

## 2022-10-16 DIAGNOSIS — E039 Hypothyroidism, unspecified: Secondary | ICD-10-CM | POA: Insufficient documentation

## 2022-10-16 DIAGNOSIS — Z833 Family history of diabetes mellitus: Secondary | ICD-10-CM | POA: Insufficient documentation

## 2022-10-16 HISTORY — DX: Type 2 diabetes mellitus without complications: E11.9

## 2022-10-16 HISTORY — PX: CARPOMETACARPEL SUSPENSION PLASTY: SHX5005

## 2022-10-16 LAB — GLUCOSE, CAPILLARY
Glucose-Capillary: 75 mg/dL (ref 70–99)
Glucose-Capillary: 102 mg/dL — ABNORMAL HIGH (ref 70–99)

## 2022-10-16 SURGERY — CARPOMETACARPEL (CMC) SUSPENSION PLASTY
Anesthesia: Monitor Anesthesia Care | Site: Thumb | Laterality: Right

## 2022-10-16 MED ORDER — PROPOFOL 500 MG/50ML IV EMUL
INTRAVENOUS | Status: AC
  Filled 2022-10-16: qty 50

## 2022-10-16 MED ORDER — MIDAZOLAM HCL 2 MG/2ML IJ SOLN
INTRAMUSCULAR | Status: AC
  Filled 2022-10-16: qty 2

## 2022-10-16 MED ORDER — GLYCOPYRROLATE PF 0.2 MG/ML IJ SOSY
PREFILLED_SYRINGE | INTRAMUSCULAR | Status: AC
  Filled 2022-10-16: qty 1

## 2022-10-16 MED ORDER — OXYCODONE HCL 5 MG PO TABS: 5.0000 mg | ORAL_TABLET | Freq: Once | ORAL | Status: DC | PRN

## 2022-10-16 MED ORDER — OXYCODONE HCL 5 MG/5ML PO SOLN: 5.0000 mg | Freq: Once | ORAL | Status: DC | PRN

## 2022-10-16 MED ORDER — PROPOFOL 500 MG/50ML IV EMUL
INTRAVENOUS | Status: DC | PRN
  Administered 2022-10-16: 25 ug/kg/min via INTRAVENOUS

## 2022-10-16 MED ORDER — VANCOMYCIN HCL IN DEXTROSE 1-5 GM/200ML-% IV SOLN
INTRAVENOUS | Status: AC
  Filled 2022-10-16: qty 200

## 2022-10-16 MED ORDER — 0.9 % SODIUM CHLORIDE (POUR BTL) OPTIME
TOPICAL | Status: DC | PRN
  Administered 2022-10-16: 300 mL

## 2022-10-16 MED ORDER — FENTANYL CITRATE (PF) 100 MCG/2ML IJ SOLN: 25.0000 ug | INTRAMUSCULAR | Status: DC | PRN

## 2022-10-16 MED ORDER — ONDANSETRON HCL 4 MG/2ML IJ SOLN
INTRAMUSCULAR | Status: DC | PRN
  Administered 2022-10-16: 4 mg via INTRAVENOUS

## 2022-10-16 MED ORDER — BUPIVACAINE HCL (PF) 0.25 % IJ SOLN
INTRAMUSCULAR | Status: AC
  Filled 2022-10-16: qty 30

## 2022-10-16 MED ORDER — VANCOMYCIN HCL IN DEXTROSE 1-5 GM/200ML-% IV SOLN
1000.0000 mg | INTRAVENOUS | Status: AC
  Administered 2022-10-16: 1000 mg via INTRAVENOUS

## 2022-10-16 MED ORDER — LACTATED RINGERS IV SOLN: INTRAVENOUS | Status: DC

## 2022-10-16 MED ORDER — ONDANSETRON HCL 4 MG/2ML IJ SOLN: 4.0000 mg | Freq: Once | INTRAMUSCULAR | Status: DC | PRN

## 2022-10-16 MED ORDER — FENTANYL CITRATE (PF) 100 MCG/2ML IJ SOLN
INTRAMUSCULAR | Status: AC
  Filled 2022-10-16: qty 2

## 2022-10-16 MED ORDER — MIDAZOLAM HCL 5 MG/5ML IJ SOLN
INTRAMUSCULAR | Status: DC | PRN
  Administered 2022-10-16: 1 mg via INTRAVENOUS
  Administered 2022-10-16 (×2): .5 mg via INTRAVENOUS

## 2022-10-16 MED ORDER — BUPIVACAINE-EPINEPHRINE (PF) 0.5% -1:200000 IJ SOLN
INTRAMUSCULAR | Status: DC | PRN
  Administered 2022-10-16: 30 mL via PERINEURAL

## 2022-10-16 MED ORDER — MIDAZOLAM HCL 2 MG/2ML IJ SOLN
1.0000 mg | Freq: Once | INTRAMUSCULAR | Status: AC
  Administered 2022-10-16: 1 mg via INTRAVENOUS

## 2022-10-16 MED ORDER — FENTANYL CITRATE (PF) 100 MCG/2ML IJ SOLN
50.0000 ug | Freq: Once | INTRAMUSCULAR | Status: AC
  Administered 2022-10-16: 50 ug via INTRAVENOUS

## 2022-10-16 SURGICAL SUPPLY — 61 items
ADH SKN CLS APL DERMABOND .7 (GAUZE/BANDAGES/DRESSINGS)
ANCHOR FIBERLOCK SUSPENSION (Anchor) IMPLANT
APL PRP STRL LF DISP 70% ISPRP (MISCELLANEOUS) ×1
BLADE SURG 15 STRL LF DISP TIS (BLADE) ×1 IMPLANT
BLADE SURG 15 STRL SS (BLADE) ×3
BNDG CMPR 9X4 STRL LF SNTH (GAUZE/BANDAGES/DRESSINGS) ×1
BNDG ELASTIC 3X5.8 VLCR STR LF (GAUZE/BANDAGES/DRESSINGS) IMPLANT
BNDG ELASTIC 4X5.8 VLCR STR LF (GAUZE/BANDAGES/DRESSINGS) ×1 IMPLANT
BNDG ESMARK 4X9 LF (GAUZE/BANDAGES/DRESSINGS) ×1 IMPLANT
BNDG GAUZE DERMACEA FLUFF 4 (GAUZE/BANDAGES/DRESSINGS) ×1 IMPLANT
BNDG GZE DERMACEA 4 6PLY (GAUZE/BANDAGES/DRESSINGS) ×1
BNDG PLASTER X FAST 3X3 WHT LF (CAST SUPPLIES) IMPLANT
BNDG PLASTER X FAST 4X5 WHT LF (CAST SUPPLIES) IMPLANT
BNDG PLSTR 5X4 XFST ST WHT LF (CAST SUPPLIES)
BNDG PLSTR 9X3 FST ST WHT (CAST SUPPLIES)
CHLORAPREP W/TINT 26 (MISCELLANEOUS) ×1 IMPLANT
CORD BIPOLAR FORCEPS 12FT (ELECTRODE) ×1 IMPLANT
COVER BACK TABLE 60X90IN (DRAPES) ×1 IMPLANT
COVER MAYO STAND STRL (DRAPES) ×1 IMPLANT
CUFF TOURN SGL QUICK 18X4 (TOURNIQUET CUFF) IMPLANT
CUFF TOURN SGL QUICK 24 (TOURNIQUET CUFF)
CUFF TRNQT CYL 24X4X16.5-23 (TOURNIQUET CUFF) IMPLANT
DERMABOND ADVANCED .7 DNX12 (GAUZE/BANDAGES/DRESSINGS) IMPLANT
DRAPE EXTREMITY T 121X128X90 (DISPOSABLE) ×1 IMPLANT
DRAPE OEC MINIVIEW 54X84 (DRAPES) ×1 IMPLANT
DRAPE SURG 17X23 STRL (DRAPES) ×1 IMPLANT
GAUZE SPONGE 4X4 12PLY STRL (GAUZE/BANDAGES/DRESSINGS) ×1 IMPLANT
GAUZE XEROFORM 1X8 LF (GAUZE/BANDAGES/DRESSINGS) ×1 IMPLANT
GLOVE BIO SURGEON STRL SZ7 (GLOVE) ×1 IMPLANT
GLOVE BIO SURGEON STRL SZ8 (GLOVE) IMPLANT
GLOVE BIOGEL PI IND STRL 7.0 (GLOVE) ×1 IMPLANT
GOWN STRL REUS W/ TWL LRG LVL3 (GOWN DISPOSABLE) ×2 IMPLANT
GOWN STRL REUS W/ TWL XL LVL3 (GOWN DISPOSABLE) IMPLANT
GOWN STRL REUS W/TWL LRG LVL3 (GOWN DISPOSABLE) ×1
GOWN STRL REUS W/TWL XL LVL3 (GOWN DISPOSABLE) ×2
K-WIRE DBL .045X4 NSTRL (WIRE)
KWIRE DBL .045X4 NSTRL (WIRE) IMPLANT
NDL HYPO 25X1 1.5 SAFETY (NEEDLE) IMPLANT
NEEDLE HYPO 25X1 1.5 SAFETY (NEEDLE) IMPLANT
NS IRRIG 1000ML POUR BTL (IV SOLUTION) ×1 IMPLANT
PACK BASIN DAY SURGERY FS (CUSTOM PROCEDURE TRAY) ×1 IMPLANT
PAD CAST 3X4 CTTN HI CHSV (CAST SUPPLIES) ×1 IMPLANT
PADDING CAST COTTON 3X4 STRL (CAST SUPPLIES) ×1
SLEEVE SCD COMPRESS KNEE MED (STOCKING) IMPLANT
SLING ARM FOAM STRAP LRG (SOFTGOODS) IMPLANT
SPLINT FIBERGLASS 4X30 (CAST SUPPLIES) ×1 IMPLANT
STRIP CLOSURE SKIN 1/2X4 (GAUZE/BANDAGES/DRESSINGS) IMPLANT
SUCTION FRAZIER HANDLE 10FR (MISCELLANEOUS) ×1
SUCTION TUBE FRAZIER 10FR DISP (MISCELLANEOUS) IMPLANT
SUT ETHIBOND 3-0 V-5 (SUTURE) IMPLANT
SUT ETHILON 4 0 PS 2 18 (SUTURE) ×1 IMPLANT
SUT MERSILENE 4 0 P 3 (SUTURE) IMPLANT
SUT MNCRL AB 3-0 PS2 18 (SUTURE) ×1 IMPLANT
SUT MNCRL AB 4-0 PS2 18 (SUTURE) IMPLANT
SUT SUPRAMID 3-0 (SUTURE) IMPLANT
SUT VICRYL 4-0 PS2 18IN ABS (SUTURE) IMPLANT
SYR BULB EAR ULCER 3OZ GRN STR (SYRINGE) ×1 IMPLANT
SYR CONTROL 10ML LL (SYRINGE) IMPLANT
TOWEL GREEN STERILE FF (TOWEL DISPOSABLE) ×2 IMPLANT
TUBE CONNECTING 20X1/4 (TUBING) IMPLANT
UNDERPAD 30X36 HEAVY ABSORB (UNDERPADS AND DIAPERS) ×1 IMPLANT

## 2022-10-16 NOTE — Op Note (Signed)
Date of Surgery: 10/16/2022  INDICATIONS: Patient is a 52 y.o.-year-old female with right thumb carpometacarpal arthritis that has failed conservative management with activity modification, multiple braces, oral anti-inflammatory medication, and several corticosteroid injections.  Risks, benefits, and alternatives to surgery were again discussed with the patient in the preoperative area. The patient wishes to proceed with surgery.  Informed consent was signed after our discussion.   PREOPERATIVE DIAGNOSIS:  Right thumb CMC osteoarthritis  POSTOPERATIVE DIAGNOSIS: Same.  PROCEDURE:  Right trapeziecomy (91638) Right thumb suture anchor suspensionplasty (46659)   SURGEON: Audria Nine, M.D.  ASSIST: None  ANESTHESIA:  Regional + MAC  IV FLUIDS AND URINE: See anesthesia.  ESTIMATED BLOOD LOSS: 5 mL.  IMPLANTS:  Implant Name Type Inv. Item Serial No. Manufacturer Lot No. LRB No. Used Action  [employer] S[employer] - [id number] Anchor ANCHOR [employer] [id number] Right 1 Implanted     DRAINS: None  COMPLICATIONS: None  DESCRIPTION OF PROCEDURE: The patient was met in the preoperative holding area where the surgical site was marked and the consent form was signed.  The patient was then taken to the operating room and a hand table locked adjacent to the stretcher on the patient's operative side.  All bony prominences were well padded.  A tourniquet was applied to the right upper arm.  Monitored sedation was induced.  The operative extremity was prepped and draped in the usual and sterile fashion.  A formal time-out was performed to confirm that this was the correct patient, surgery, side, and site.   Following formal timeout, the limb was exsanguinated by an Esmarch bandage and the tourniquet inflated to 250 mmHg.  A longitudinal incision was designed over the dorsal aspect of the thumb centered over the [facility] joint.  The skin was incised.  Blunt dissection  was used to develop full-thickness skin flaps.  Branches of the superficial radial nerve were identified and protected.  Small crossing vessels were coagulated by bipolar cautery as needed.  The interval between the APL and EPB tendons was identified.  Blunt dissection was used to identify the large branch of the radial artery with its accompanying vein.  The vascular bundle was mobilized using bipolar cautery to coagulate small branches.  This allowed a retractor to be placed between the APL and EPB tendons while protecting the vessels.  A full-thickness capsulotomy was performed from the base of the metacarpal to the distal pole of the scaphoid.  The trapezium was dissected circumferentially of all capsuloligamentous attachments.  The STT articulation was identified.  A McGlamry elevator was used to divide the remaining capsuloligamentous attachments and the trapezium was removed en bloc.  There was significant eburnation of the articular surface of the trapezium and metacarpal base.  The articulation between the trapezoid and scaphoid was identified and inspected.  There was no evidence of cartilage loss.  With the trapezium removed, the base of the index metacarpal was identified.  A guidewire was then placed across the base of the index metacarpal exiting just distal to the intermetacarpal space.  The screw-in guide was then passed over the guidewire and threaded into the base of the metacarpal.  The guidewire was removed the fiber tack drill advanced across the metacarpal base.  The guidewire was removed and the fiber tack anchor was then malleted into position.  The inserter was removed and the anchor was set with traction on the suture limbs.  I then turned my attention towards the base of the thumb metacarpal.  The midpoint between the volar and dorsal aspects of the articular surface was identified.  The guidewire for the swivel lock anchor was then drilled to the appropriate laser line taking care that  the tip of the guidewire did not violate the cortex in both AP and lateral views of the thumb metacarpal.  The guidewire was in appropriate position such that the suture would sit within the saddle of the metacarpal base.  The 3 mm drill was then drilled over the guidewire using the soft tissue protector to the appropriate depth stop.  The thumb metacarpal was held adducted against the palm with slight traction.  The 2 limbs of the suture tape were then placed in the fork of the anchor and the anchor was deployed with excellent bony purchase.  The ends of the suture tape were then cut.  The thumb was found to have full passive abduction and adduction.  There was no metacarpal subsidence when axial load applied to the thumb metacarpal.  Happy with my suspensioplasty, the wound was then thoroughly irrigated with copious sterile saline.  The capsule was then closed using a 4-0 Mersilene suture.  The tourniquet was let down.  Hemostasis was achieved with direct pressure over the wound.  Were warm, pink, and well-perfused with the tourniquet deflated.  The wound was then closed in a layered fashion using a 4-0 Monocryl suture in a buried interrupted fashion followed by 4-0 nylon suture in a horizontal mattress fashion.  Was then dressed with Xeroform, folded Kerlix, cast padding, and a well-padded thumb spica splint was applied.  The patient was reversed from sedation.  Drapes were taken down and the hand table removed from the side of the stretcher.  All counts were correct x 2 at the end of the procedure.  The patient was then taken to the PACU in stable condition.   POSTOPERATIVE PLAN: Patient will be discharge to home with appropriate pain medication and discharge instructions.  She will leave her splint on until she is evaluated by me in the office for her first postop visit or she is evaluated by the hand therapist.  Hand therapy referral will be placed.  Audria Nine, MD 9:06 AM

## 2022-10-16 NOTE — Anesthesia Procedure Notes (Addendum)
Anesthesia Regional Block: Supraclavicular block   Pre-Anesthetic Checklist: , timeout performed,  Correct Patient, Correct Site, Correct Laterality,  Correct Procedure, Correct Position, site marked,  Risks and benefits discussed,  Surgical consent,  Pre-op evaluation,  At surgeon's request  Laterality: Right  Prep: chloraprep       Needles:  Injection technique: Single-shot  Needle Type: Echogenic Stimulator Needle     Needle Length: 10cm  Needle Gauge: 21   Needle insertion depth: 6 cm   Additional Needles:   Procedures:,,,, ultrasound used (permanent image in chart),,    Narrative:  Start time: 10/16/2022 7:13 AM End time: 10/16/2022 7:18 AM Injection made incrementally with aspirations every 5 mL.  Performed by: Personally  Anesthesiologist: Josephine Igo, MD  Additional Notes: Timeout performed. Patient sedated. Relevant anatomy ID'd using [place]. Incremental 2-99ml injection of LA with frequent aspiration. Patient tolerated procedure well.

## 2022-10-16 NOTE — Transfer of Care (Signed)
Immediate Anesthesia Transfer of Care Note  Patient: Linda Lin  Procedure(s) Performed: THUMB TRAPEZIECTOMY WITH SUSPENSION PLASTY (Right: Thumb)  Patient Location: PACU  Anesthesia Type:MAC combined with regional for post-op pain  Level of Consciousness: awake  Airway & Oxygen Therapy: Patient Spontanous Breathing and Patient connected to face mask oxygen  Post-op Assessment: Report given to RN and Post -op Vital signs reviewed and stable  Post vital signs: Reviewed and stable  Last Vitals:  Vitals Value Taken Time  BP    Temp 36.4 C 10/16/22 0910  Pulse 58 10/16/22 0910  Resp 17 10/16/22 0910  SpO2 100 % 10/16/22 0910    Last Pain:  Vitals:   10/16/22 0636  TempSrc: Oral  PainSc: 7       Patients Stated Pain Goal: 5 (60/02/98 4730)  Complications: No notable events documented.

## 2022-10-16 NOTE — H&P (Signed)
HAND SURGERY   HPI: Patient is a 52 y.o. female who presents with severe right thumb pain at the [facility] joint secondary to osteoarthritis.  She has failed conservative management with activity modification, oral NSAIDs, multiple braces, and several corticosteroid injections. She presents today for right trapeziectomy with suspensionplasty.  Patient denies any changes to their medical history or new systemic symptoms today.    Past Medical History:  Diagnosis Date   Asthma    Cellulitis    Diabetes mellitus without complication ([place])    Heart attack ([place]) 2021   Scabies    Thyroid disorder    Past Surgical History:  Procedure Laterality Date   LEFT HEART CATH AND CORONARY ANGIOGRAPHY N/A 06/09/2020   Procedure: LEFT HEART CATH AND CORONARY ANGIOGRAPHY;  Surgeon: Wellington Hampshire, MD;  Location: [facility];  Service: Cardiovascular;  Laterality: N/A;   Social History   Socioeconomic History   Marital status: Single    Spouse name: Not on file   Number of children: Not on file   Years of education: Not on file   Highest education level: Not on file  Occupational History   Occupation: Regulatory affairs officer: [employer]  Tobacco Use   Smoking status: Every Day    Packs/day: 0.25    Years: 20.00    Total pack years: 5.00    Types: Cigarettes    Start date: 10/14/1987   Smokeless tobacco: Never  Vaping Use   Vaping Use: Never used  Substance and Sexual Activity   Alcohol use: Yes    Comment: occ   Drug use: Not Currently    Types: Marijuana    Comment: sporadically-last used sept 2019   Sexual activity: Not Currently    Partners: Male    Birth control/protection: None  Other Topics Concern   Not on file  Social History Narrative   Pt lives w/ her sister.   Supervisor.    Social Determinants of Health   Financial Resource Strain: Not on file  Food Insecurity: Not on file  Transportation Needs: Not on file  Physical Activity: Not on file  Stress:  Not on file  Social Connections: Not on file   Family History  Problem Relation Age of Onset   Asthma Mother    Diabetes Father    Kidney failure Father    Hypertension Sister    CAD Maternal Grandmother    Breast cancer Cousin    Diabetes Mellitus II Brother    Hypertension Brother    Cancer Neg Hx    - negative except otherwise stated in the family history section Allergies  Allergen Reactions   Other Hives    shellfish   Cephalexin Rash   Doxycycline Rash   Prior to Admission medications   Medication Sig Start Date End Date Taking? Authorizing Provider  acetaminophen (TYLENOL) 500 MG tablet Take 500 mg by mouth every 6 (six) hours as needed for mild pain or moderate pain.    Yes [provider]  aspirin 81 MG chewable tablet Chew 1 tablet (81 mg total) by mouth daily. 06/10/20  Yes Sande Rives E, PA-C  celecoxib (CELEBREX) 100 MG capsule Take 1 capsule (100 mg total) by mouth 2 (two) times daily as needed for mild pain or moderate pain. 07/31/22  Yes Lindell Spar, MD  fluticasone-salmeterol (ADVAIR) 100-50 MCG/ACT AEPB Inhale 1 puff into the lungs 2 (two) times daily. 08/19/22  Yes Lindell Spar, MD  ibuprofen (  ADVIL) 200 MG tablet Take 200 mg by mouth every 6 (six) hours as needed for headache.   Yes [provider]  levothyroxine (SYNTHROID) 100 MCG tablet Take 1 tablet (100 mcg total) by mouth daily before breakfast. 08/01/22  Yes Posey Pronto, Colin Broach, MD  lisinopril (ZESTRIL) 5 MG tablet Take 1 tablet (5 mg total) by mouth daily. 07/31/22 01/27/23 Yes Lindell Spar, MD  metFORMIN (GLUCOPHAGE) 500 MG tablet Take 1 tablet (500 mg total) by mouth 2 (two) times daily with a meal. 08/01/22  Yes Lindell Spar, MD  metoprolol tartrate (LOPRESSOR) 25 MG tablet Take 1 tablet (25 mg total) by mouth 2 (two) times daily. 07/31/22  Yes Lindell Spar, MD  rosuvastatin (CRESTOR) 40 MG tablet Take 1 tablet (40 mg total) by mouth daily. 01/23/22 10/16/22 Yes Branch, Alphonse Guild, MD  Vitamin D, Ergocalciferol, (DRISDOL) 1.25 MG (50000 UNIT) CAPS capsule Take 1 capsule (50,000 Units total) by mouth every 7 (seven) days. 01/01/22  Yes Lindell Spar, MD  albuterol (PROVENTIL) (2.5 MG/3ML) 0.083% nebulizer solution Take 3 mLs (2.5 mg total) by nebulization every 4 (four) hours as needed for wheezing or shortness of breath. 11/25/20 07/31/22  Kathie Dike, MD  albuterol (VENTOLIN HFA) 108 (90 Base) MCG/ACT inhaler INHALE 2 PUFFS BY MOUTH EVERY 6 HOURS AS NEEDED FOR WHEEZING OR SHORTNESS OF BREATH 06/04/22   Lindell Spar, MD   DG MINI C-ARM IMAGE ONLY  Result Date: 10/16/2022 There is no interpretation for this exam.  This order is for images obtained during a surgical procedure.  Please See "Surgeries" Tab for more information regarding the procedure.   - Positive ROS: All other systems have been reviewed and were otherwise negative with the exception of those mentioned in the HPI and as above.  Physical Exam: General: No acute distress, resting comfortably Cardiovascular: BUE warm and well perfused, normal rate Respiratory: Normal WOB on RA Skin: Warm and dry Neurologic: Sensation intact distally Psychiatric: Patient is at baseline mood and affect  Right Upper Extremity  Pain and crepitus with CMC grind test. No static or dynamic MCP hyper-extension Mild palmar abduction contracture of thumb Sensory and motor exam limited by peripheral nerve block Hand warm and well perfused w/ BCR in all fingers    Assessment: 52 yo F w/ right thumb CMC arthritis that has failed conservative management with activity modification, oral NSAIDs, bracing, and corticosteroid injection.   Plan: OR today for right thumb trapeziectomy with suspensionplasty. We again reviewed the risks of surgery which include bleeding, infection, damage to neurovascular structures, persistent symptoms, painful metacarpal subsidence, need for additional surgery.  Informed consent was signed.  All  questions were answered.   Sherilyn Cooter, M.D. [facility] 7:24 AM

## 2022-10-16 NOTE — Anesthesia Postprocedure Evaluation (Signed)
Anesthesia Post Note  Patient: Linda Lin  Procedure(s) Performed: THUMB TRAPEZIECTOMY WITH SUSPENSION PLASTY (Right: Thumb)     Patient location during evaluation: PACU Anesthesia Type: Regional and MAC Level of consciousness: awake and alert Pain management: pain level controlled Vital Signs Assessment: post-procedure vital signs reviewed and stable Respiratory status: spontaneous breathing, nonlabored ventilation and respiratory function stable Cardiovascular status: blood pressure returned to baseline and stable Postop Assessment: no apparent nausea or vomiting Anesthetic complications: no   No notable events documented.  Last Vitals:  Vitals:   10/16/22 0930 10/16/22 0945  BP:    Pulse: (!) 56 62  Resp: 19 18  Temp:  (!) 36.4 C  SpO2: 93% 94%    Last Pain:  Vitals:   10/16/22 0945  TempSrc:   PainSc: 0-No pain                 Naol Ontiveros A.

## 2022-10-16 NOTE — Discharge Instructions (Addendum)
Post Anesthesia Home Care Instructions  Activity: Get plenty of rest for the remainder of the day. A responsible individual must stay with you for 24 hours following the procedure.  For the next 24 hours, DO NOT: -Drive a car -Paediatric nurse -Drink alcoholic beverages -Take any medication unless instructed by your physician -Make any legal decisions or sign important papers.  Meals: Start with liquid foods such as gelatin or soup. Progress to regular foods as tolerated. Avoid greasy, spicy, heavy foods. If nausea and/or vomiting occur, drink only clear liquids until the nausea and/or vomiting subsides. Call your physician if vomiting continues.  Special Instructions/Symptoms: Your throat may feel dry or sore from the anesthesia or the breathing tube placed in your throat during surgery. If this causes discomfort, gargle with warm salt water. The discomfort should disappear within 24 hours.  Regional Anesthesia Blocks  1. Numbness or the inability to move the "blocked" extremity may last from 3-48 hours after placement. The length of time depends on the medication injected and your individual response to the medication. If the numbness is not going away after 48 hours, call your surgeon.  2. The extremity that is blocked will need to be protected until the numbness is gone and the  Strength has returned. Because you cannot feel it, you will need to take extra care to avoid injury. Because it may be weak, you may have difficulty moving it or using it. You may not know what position it is in without looking at it while the block is in effect.  3. For blocks in the legs and feet, returning to weight bearing and walking needs to be done carefully. You will need to wait until the numbness is entirely gone and the strength has returned. You should be able to move your leg and foot normally before you try and bear weight or walk. You will need someone to be with you when you first try to ensure  you do not fall and possibly risk injury.  4. Bruising and tenderness at the needle site are common side effects and will resolve in a few days.  5. Persistent numbness or new problems with movement should be communicated to the surgeon or the [facility] [phone] [place] [phone]).        Audria Nine, M.D. Hand Surgery  POST-OPERATIVE DISCHARGE INSTRUCTIONS   PRESCRIPTIONS: You may have been given a prescription to be taken as directed for post-operative pain control.  You may also take over the counter ibuprofen/aleve and tylenol for pain. Take this as directed on the packaging. Do not exceed 3000 mg tylenol/acetaminophen in 24 hours.  Ibuprofen 600-800 mg (3-4) tablets by mouth every 6 hours as needed for pain.  OR Aleve 2 tablets by mouth every 12 hours (twice daily) as needed for pain.  AND/OR Tylenol 1000 mg (2 tablets) every 8 hours as needed for pain.  Please use your pain medication carefully, as refills are limited and you may not be provided with one.  As stated above, please use over the counter pain medicine - it will also be helpful with decreasing your swelling.    ANESTHESIA: After your surgery, post-surgical discomfort or pain is likely. This discomfort can last several days to a few weeks. At certain times of the day your discomfort may be more intense.   Did you receive a nerve block?  A nerve block can provide pain relief for one hour to two days after your surgery.  As long as the nerve block is working, you will experience little or no sensation in the area the surgeon operated on.  As the nerve block wears off, you will begin to experience pain or discomfort. It is very important that you begin taking your prescribed pain medication before the nerve block fully wears off. Treating your pain at the first sign of the block wearing off will ensure your pain is better controlled and more tolerable when full-sensation  returns. Do not wait until the pain is intolerable, as the medicine will be less effective. It is better to treat pain in advance than to try and catch up.   General Anesthesia:  If you did not receive a nerve block during your surgery, you will need to start taking your pain medication shortly after your surgery and should continue to do so as prescribed by your surgeon.     ICE AND ELEVATION: You may use ice for the first 48-72 hours, but it is not critical.   Motion of your fingers is very important to decrease the swelling.  Elevation, as much as possible for the next 48 hours, is critical for decreasing swelling as well as for pain relief. Elevation means when you are seated or lying down, you hand should be at or above your heart. When walking, the hand needs to be at or above the level of your elbow.  If the bandage gets too tight, it may need to be loosened. Please contact our office and we will instruct you in how to do this.    SURGICAL BANDAGES:  Keep your dressing and/or splint clean and dry at all times.  Do not remove until you are seen again in the office.  If careful, you may place a plastic bag over your bandage and tape the end to shower, but be careful, do not get your bandages wet.     HAND THERAPY:  You will be contacted to set up your first therapy appointment.    ACTIVITY AND WORK: You are encouraged to move any fingers which are not in the bandage.  Light use of the fingers is allowed to assist the other hand with daily hygiene and eating, but strong gripping or lifting is often uncomfortable and should be avoided.  You might miss a variable period of time from work and hopefully this issue has been discussed prior to surgery. You may not do any heavy work with your affected hand for about 2 weeks.    [facility] Second Floor, [place], [place] [address] [phone]    Post Anesthesia Home Care Instructions  Activity: Get plenty of  rest for the remainder of the day. A responsible individual must stay with you for 24 hours following the procedure.  For the next 24 hours, DO NOT: -Drive a car -Paediatric nurse -Drink alcoholic beverages -Take any medication unless instructed by your physician -Make any legal decisions or sign important papers.  Meals: Start with liquid foods such as gelatin or soup. Progress to regular foods as tolerated. Avoid greasy, spicy, heavy foods. If nausea and/or vomiting occur, drink only clear liquids until the nausea and/or vomiting subsides. Call your physician if vomiting continues.  Special Instructions/Symptoms: Your throat may feel dry or sore from the anesthesia or the breathing tube placed in your throat during surgery. If this causes discomfort, gargle with warm salt water. The discomfort should disappear within 24 hours.  If you had a scopolamine patch placed behind your  ear for the management of post- operative nausea and/or vomiting:  1. The medication in the patch is effective for 72 hours, after which it should be removed.  Wrap patch in a tissue and discard in the trash. Wash hands thoroughly with soap and water. 2. You may remove the patch earlier than 72 hours if you experience unpleasant side effects which may include dry mouth, dizziness or visual disturbances. 3. Avoid touching the patch. Wash your hands with soap and water after contact with the patch.

## 2022-10-16 NOTE — Progress Notes (Signed)
Assisted Dr. Royce Macadamia with right, supraclavicular, ultrasound guided block. Side rails up, monitors on throughout procedure. See vital signs in flow sheet. Tolerated Procedure well.

## 2022-10-16 NOTE — Interval H&P Note (Signed)
History and Physical Interval Note:  10/16/2022 7:26 AM  Linda Lin  has presented today for surgery, with the diagnosis of Right thumb carpometacarpal osteoarthrits.  The various methods of treatment have been discussed with the patient and family. After consideration of risks, benefits and other options for treatment, the patient has consented to  Procedure(s) with comments: THUMB TRAPEZIECTOMY WITH SUSPENSION PLASTY (Right) - or MAC with regional block 120 as a surgical intervention.  The patient's history has been reviewed, patient examined, no change in status, stable for surgery.  I have reviewed the patient's chart and labs.  Questions were answered to the patient's satisfaction.     Laiyla Slagel Adnan Vanvoorhis

## 2022-10-17 ENCOUNTER — Encounter (HOSPITAL_BASED_OUTPATIENT_CLINIC_OR_DEPARTMENT_OTHER): Payer: Self-pay | Admitting: Orthopedic Surgery

## 2022-11-13 ENCOUNTER — Ambulatory Visit (INDEPENDENT_AMBULATORY_CARE_PROVIDER_SITE_OTHER): Payer: Managed Care, Other (non HMO) | Admitting: Internal Medicine

## 2022-11-13 ENCOUNTER — Encounter: Payer: Self-pay | Admitting: Internal Medicine

## 2022-11-13 VITALS — BP 146/82 | HR 70 | Ht 62.0 in | Wt 175.0 lb

## 2022-11-13 DIAGNOSIS — Z23 Encounter for immunization: Secondary | ICD-10-CM

## 2022-11-13 DIAGNOSIS — Z720 Tobacco use: Secondary | ICD-10-CM

## 2022-11-13 DIAGNOSIS — Z0001 Encounter for general adult medical examination with abnormal findings: Secondary | ICD-10-CM | POA: Diagnosis not present

## 2022-11-13 DIAGNOSIS — I10 Essential (primary) hypertension: Secondary | ICD-10-CM | POA: Diagnosis not present

## 2022-11-13 DIAGNOSIS — M1811 Unilateral primary osteoarthritis of first carpometacarpal joint, right hand: Secondary | ICD-10-CM

## 2022-11-13 DIAGNOSIS — E118 Type 2 diabetes mellitus with unspecified complications: Secondary | ICD-10-CM | POA: Diagnosis not present

## 2022-11-13 DIAGNOSIS — E559 Vitamin D deficiency, unspecified: Secondary | ICD-10-CM

## 2022-11-13 DIAGNOSIS — I2510 Atherosclerotic heart disease of native coronary artery without angina pectoris: Secondary | ICD-10-CM

## 2022-11-13 DIAGNOSIS — E782 Mixed hyperlipidemia: Secondary | ICD-10-CM

## 2022-11-13 DIAGNOSIS — E890 Postprocedural hypothyroidism: Secondary | ICD-10-CM

## 2022-11-13 DIAGNOSIS — N951 Menopausal and female climacteric states: Secondary | ICD-10-CM

## 2022-11-13 MED ORDER — VEOZAH 45 MG PO TABS: 1.0000 | ORAL_TABLET | Freq: Every day | ORAL | 5 refills | Status: DC

## 2022-11-13 MED ORDER — LISINOPRIL 10 MG PO TABS: 10.0000 mg | ORAL_TABLET | Freq: Every day | ORAL | 1 refills | Status: DC

## 2022-11-13 MED ORDER — ROSUVASTATIN CALCIUM 40 MG PO TABS: 40.0000 mg | ORAL_TABLET | Freq: Every day | ORAL | 3 refills | Status: DC

## 2022-11-13 NOTE — Assessment & Plan Note (Signed)
S/p trapeziectomy Followed by Hand surgery Undergoing to OT Celebrex PRN for pain

## 2022-11-13 NOTE — Assessment & Plan Note (Signed)
On Vitamin D supplement

## 2022-11-13 NOTE — Assessment & Plan Note (Signed)
Smokes about 4-5 cigarettes/day  Asked about quitting: confirms that she currently smokes cigarettes Advise to quit smoking: Educated about QUITTING to reduce the risk of cancer, cardio and cerebrovascular disease. Assess willingness: Unwilling to quit at this time, but is working on cutting back. Assist with counseling and pharmacotherapy: Counseled for 5 minutes and literature provided. Arrange for follow up: follow up in 3 months and continue to offer help.

## 2022-11-13 NOTE — Progress Notes (Addendum)
Established Patient Office Visit  Subjective:  Patient ID: Linda Lin, female    DOB: 28-Sep-1970  Age: 52 y.o. MRN: [id number]  CC:  Chief Complaint  Patient presents with   Annual Exam    HPI Linda Lin is a 52 y.o. female with past medical history of CAD, HTN, asthma, hypothyroidism and tobacco abuse who presents for annual physical.  HTN: Her BP was elevated today. She has been taking lisinopril and metoprolol. She denies any headache, dizziness, chest pain, dyspnea or palpitations.  Type II DM: She has been taking metformin regularly.  She does not check her blood glucose.  Denies any polyuria or polyphagia currently.   Hypothyroidism: She has history of radioactive iodine therapy for Graves' disease.  She has been taking levothyroxine on a regular basis now.  She has history of very high TSH levels, but has not followed up with endocrinology recently.  She has history of OA of CMC joint of right hand. She had recent had trapeziectomy with suspension plasty of right thumb on 01/24.  She has a thumb spica in place.  She has mild swelling of the right hand.  She is undergoing occupational therapy for it.  She she also reports hot flashes, at least 3 episodes in a day.  Her LMP was about a year ago.  She denies any vaginal dryness currently.  Of note, she currently smokes and is not candidate for HRT.  Past Medical History:  Diagnosis Date   Asthma    Cellulitis    Diabetes mellitus without complication ([place])    Heart attack ([place]) 2021   Scabies    Thyroid disorder     Past Surgical History:  Procedure Laterality Date   CARPOMETACARPEL SUSPENSION PLASTY Right 10/16/2022   Procedure: THUMB TRAPEZIECTOMY WITH SUSPENSION PLASTY;  Surgeon: Sherilyn Cooter, MD;  Location: [facility];  Service: Orthopedics;  Laterality: Right;   LEFT HEART CATH AND CORONARY ANGIOGRAPHY N/A 06/09/2020   Procedure: LEFT HEART CATH AND CORONARY ANGIOGRAPHY;  Surgeon:  Wellington Hampshire, MD;  Location: [facility];  Service: Cardiovascular;  Laterality: N/A;    Family History  Problem Relation Age of Onset   Asthma Mother    Diabetes Father    Kidney failure Father    Hypertension Sister    CAD Maternal Grandmother    Breast cancer Cousin    Diabetes Mellitus II Brother    Hypertension Brother    Cancer Neg Hx     Social History   Socioeconomic History   Marital status: Single    Spouse name: Not on file   Number of children: Not on file   Years of education: Not on file   Highest education level: Not on file  Occupational History   Occupation: Regulatory affairs officer: [employer]  Tobacco Use   Smoking status: Every Day    Packs/day: 0.25    Years: 20.00    Total pack years: 5.00    Types: Cigarettes    Start date: 10/14/1987   Smokeless tobacco: Never  Vaping Use   Vaping Use: Never used  Substance and Sexual Activity   Alcohol use: Yes    Comment: occ   Drug use: Not Currently    Types: Marijuana    Comment: sporadically-last used sept 2019   Sexual activity: Not Currently    Partners: Male    Birth control/protection: None  Other Topics Concern   Not on file  Social History Narrative   Pt lives w/ her sister.   Supervisor.    Social Determinants of Health   Financial Resource Strain: Not on file  Food Insecurity: Not on file  Transportation Needs: Not on file  Physical Activity: Not on file  Stress: Not on file  Social Connections: Not on file  Intimate Partner Violence: Not on file    Outpatient Medications Prior to Visit  Medication Sig Dispense Refill   acetaminophen (TYLENOL) 500 MG tablet Take 500 mg by mouth every 6 (six) hours as needed for mild pain or moderate pain.      albuterol (PROVENTIL) (2.5 MG/3ML) 0.083% nebulizer solution Take 3 mLs (2.5 mg total) by nebulization every 4 (four) hours as needed for wheezing or shortness of breath. 75 mL 2   albuterol (VENTOLIN HFA) 108 (90 Base)  MCG/ACT inhaler INHALE 2 PUFFS BY MOUTH EVERY 6 HOURS AS NEEDED FOR WHEEZING OR SHORTNESS OF BREATH 9 g 0   aspirin 81 MG chewable tablet Chew 1 tablet (81 mg total) by mouth daily.     celecoxib (CELEBREX) 100 MG capsule Take 1 capsule (100 mg total) by mouth 2 (two) times daily as needed for mild pain or moderate pain. 30 capsule 2   fluticasone-salmeterol (ADVAIR) 100-50 MCG/ACT AEPB Inhale 1 puff into the lungs 2 (two) times daily. 1 each 3   ibuprofen (ADVIL) 200 MG tablet Take 200 mg by mouth every 6 (six) hours as needed for headache.     levothyroxine (SYNTHROID) 100 MCG tablet Take 1 tablet (100 mcg total) by mouth daily before breakfast. 90 tablet 0   metFORMIN (GLUCOPHAGE) 500 MG tablet Take 1 tablet (500 mg total) by mouth 2 (two) times daily with a meal. 180 tablet 1   metoprolol tartrate (LOPRESSOR) 25 MG tablet Take 1 tablet (25 mg total) by mouth 2 (two) times daily. 180 tablet 1   Vitamin D, Ergocalciferol, (DRISDOL) 1.25 MG (50000 UNIT) CAPS capsule Take 1 capsule (50,000 Units total) by mouth every 7 (seven) days. 12 capsule 1   lisinopril (ZESTRIL) 5 MG tablet Take 1 tablet (5 mg total) by mouth daily. 90 tablet 1   rosuvastatin (CRESTOR) 40 MG tablet Take 1 tablet (40 mg total) by mouth daily. 30 tablet 6   No facility-administered medications prior to visit.    Allergies  Allergen Reactions   Other Hives    shellfish   Cephalexin Rash   Doxycycline Rash    ROS Review of Systems  Constitutional:  Negative for chills and fever.  HENT:  Negative for congestion, sinus pressure, sinus pain and sore throat.   Eyes:  Negative for pain and discharge.  Respiratory:  Negative for cough and shortness of breath.   Cardiovascular:  Negative for chest pain and palpitations.  Gastrointestinal:  Negative for abdominal pain, diarrhea, nausea and vomiting.  Endocrine: Negative for polydipsia and polyuria.  Genitourinary:  Negative for dysuria and hematuria.  Musculoskeletal:   Positive for arthralgias (Right hand). Negative for neck pain and neck stiffness.  Skin:  Negative for rash.  Neurological:  Negative for dizziness and weakness.  Psychiatric/Behavioral:  Negative for agitation and behavioral problems. The patient is nervous/anxious.       Objective:    Physical Exam Vitals reviewed.  Constitutional:      General: She is not in acute distress.    Appearance: She is not diaphoretic.  HENT:     Head: Normocephalic and atraumatic.     Nose: Nose normal.  Mouth/Throat:     Mouth: Mucous membranes are moist.  Eyes:     General: No scleral icterus.    Extraocular Movements: Extraocular movements intact.  Cardiovascular:     Rate and Rhythm: Normal rate and regular rhythm.     Pulses: Normal pulses.     Heart sounds: Normal heart sounds. No murmur heard. Pulmonary:     Breath sounds: Normal breath sounds. No wheezing or rales.  Abdominal:     Palpations: Abdomen is soft.     Tenderness: There is no abdominal tenderness.  Musculoskeletal:     Cervical back: Neck supple. No tenderness.     Right lower leg: No edema.     Left lower leg: No edema.     Comments: Has right thumb spica with forearm brace in place  Skin:    General: Skin is warm.     Findings: No rash.  Neurological:     General: No focal deficit present.     Mental Status: She is alert and oriented to person, place, and time.     Cranial Nerves: No cranial nerve deficit.     Sensory: No sensory deficit.     Motor: No weakness.  Psychiatric:        Mood and Affect: Mood normal.        Behavior: Behavior normal.     BP (!) 146/82 (BP Location: Left Arm, Cuff Size: Normal)   Pulse 70   Ht 5' 2"$  (1.575 m)   Wt 175 lb (79.4 kg)   LMP 05/08/2020   SpO2 95%   BMI 32.01 kg/m  Wt Readings from Last 3 Encounters:  11/13/22 175 lb (79.4 kg)  10/16/22 176 lb 12.9 oz (80.2 kg)  07/31/22 177 lb 9.6 oz (80.6 kg)    Lab Results  Component Value Date   TSH 43.300 (H) 07/31/2022    Lab Results  Component Value Date   WBC 8.0 08/29/2021   HGB 9.9 (L) 08/29/2021   HCT 31.2 (L) 08/29/2021   MCV 84 08/29/2021   PLT 411 08/29/2021   Lab Results  Component Value Date   NA 140 10/15/2022   K 4.3 10/15/2022   CO2 30 10/15/2022   GLUCOSE 86 10/15/2022   BUN 17 10/15/2022   CREATININE 1.12 (H) 10/15/2022   BILITOT 0.3 07/31/2022   ALKPHOS 144 (H) 07/31/2022   AST 29 07/31/2022   ALT 21 07/31/2022   PROT 7.4 07/31/2022   ALBUMIN 4.1 07/31/2022   CALCIUM 10.0 10/15/2022   ANIONGAP 9 10/15/2022   EGFR 67 07/31/2022   Lab Results  Component Value Date   CHOL 176 08/29/2021   Lab Results  Component Value Date   HDL 60 08/29/2021   Lab Results  Component Value Date   LDLCALC 103 (H) 08/29/2021   Lab Results  Component Value Date   TRIG 66 08/29/2021   Lab Results  Component Value Date   CHOLHDL 2.9 08/29/2021   Lab Results  Component Value Date   HGBA1C 6.5 (H) 07/31/2022      Assessment & Plan:   Problem List Items Addressed This Visit       Cardiovascular and Mediastinum   CAD (coronary artery disease)   Relevant Medications   rosuvastatin (CRESTOR) 40 MG tablet   lisinopril (ZESTRIL) 10 MG tablet   Other Relevant Orders   CMP14+EGFR   CBC with Differential/Platelet   Essential hypertension    BP Readings from Last 1 Encounters:  11/13/22 (!) 146/82  Uncontrolled due to noncompliance On lisinopril and metoprolol, but has not been taking it Increased lisinopril dose to 10 mg QD Counseled for compliance with the medications Advised DASH diet and moderate exercise/walking, at least 150 mins/week      Relevant Medications   rosuvastatin (CRESTOR) 40 MG tablet   lisinopril (ZESTRIL) 10 MG tablet     Endocrine   Hypothyroidism following radioiodine therapy    H/o Graves disease, s/p radio iodine ablation On levothyroxine 100 mcg QD, was taking it inconsistently, now takes it regularly Followed by Dr. Dorris Fetch - but has lost  follow-up Check TSH and free T4      Relevant Orders   TSH + free T4   Type 2 diabetes mellitus with complication, without long-term current use of insulin ([place])    Lab Results  Component Value Date   HGBA1C 6.5 (H) 07/31/2022  Was well controlled, but currently compliance is questionable On Metformin 500 mg BID Advised to follow diabetic diet On statin and ACEi F/u CMP and lipid panel Diabetic eye exam: Advised to follow up with Ophthalmology for diabetic eye exam      Relevant Medications   rosuvastatin (CRESTOR) 40 MG tablet   lisinopril (ZESTRIL) 10 MG tablet   Other Relevant Orders   CMP14+EGFR   Hemoglobin A1c     Musculoskeletal and Integument   Arthritis of carpometacarpal (CMC) joint of right thumb    S/p trapeziectomy Followed by Hand surgery Undergoing to OT Celebrex PRN for pain        Other   Tobacco abuse    Smokes about 4-5 cigarettes/day  Asked about quitting: confirms that she currently smokes cigarettes Advise to quit smoking: Educated about QUITTING to reduce the risk of cancer, cardio and cerebrovascular disease. Assess willingness: Unwilling to quit at this time, but is working on cutting back. Assist with counseling and pharmacotherapy: Counseled for 5 minutes and literature provided. Arrange for follow up: follow up in 3 months and continue to offer help.      Hyperlipidemia    On statin Check lipid profile      Relevant Medications   rosuvastatin (CRESTOR) 40 MG tablet   lisinopril (ZESTRIL) 10 MG tablet   Vitamin D deficiency    On Vitamin D supplement      Relevant Orders   Vitamin D (25 hydroxy)   Encounter for general adult medical examination with abnormal findings - Primary    Physical exam as documented. Counseling done  re healthy lifestyle involving commitment to 150 minutes exercise per week, heart healthy diet, and attaining healthy weight.The importance of adequate sleep also discussed. Changes in health habits are  decided on by the patient with goals and time frames  set for achieving them. Immunization and cancer screening needs are specifically addressed at this visit.  Needs to send Cologuard back. Shingrix #1 today.      Vasomotor symptoms due to menopause    Has recurrent hot flashes Not a candidate for HRT due to smoking Started Veozah, sample provided      Relevant Medications   Fezolinetant (VEOZAH) 45 MG TABS   Other Visit Diagnoses     Need for zoster vaccination       Relevant Orders   Zoster Recombinant (Shingrix ) (Completed)       Meds ordered this encounter  Medications   rosuvastatin (CRESTOR) 40 MG tablet    Sig: Take 1 tablet (40 mg total) by mouth daily.    Dispense:  90  tablet    Refill:  3   Fezolinetant (VEOZAH) 45 MG TABS    Sig: Take 1 tablet (45 mg total) by mouth daily.    Dispense:  30 tablet    Refill:  5   lisinopril (ZESTRIL) 10 MG tablet    Sig: Take 1 tablet (10 mg total) by mouth daily.    Dispense:  90 tablet    Refill:  1    Dose change    Follow-up: Return in about 4 months (around 03/14/2023) for DM and hypothyroidism, Shingrix #2.    Lindell Spar, MD

## 2022-11-13 NOTE — Assessment & Plan Note (Signed)
Lab Results  Component Value Date   HGBA1C 6.5 (H) 07/31/2022   Was well controlled, but currently compliance is questionable On Metformin 500 mg BID Advised to follow diabetic diet On statin and ACEi F/u CMP and lipid panel Diabetic eye exam: Advised to follow up with Ophthalmology for diabetic eye exam

## 2022-11-13 NOTE — Patient Instructions (Signed)
Please start taking Lisinopril 10 mg instead of 5 mg.  Please start taking Veozah as prescribed for hot flashes.  Please continue taking other medications as prescribed.  Please continue to follow low carb diet and perform moderate exercise/walking at least 150 mins/week.

## 2022-11-13 NOTE — Assessment & Plan Note (Signed)
Physical exam as documented. Counseling done  re healthy lifestyle involving commitment to 150 minutes exercise per week, heart healthy diet, and attaining healthy weight.The importance of adequate sleep also discussed. Changes in health habits are decided on by the patient with goals and time frames  set for achieving them. Immunization and cancer screening needs are specifically addressed at this visit.  Needs to send Cologuard back. Shingrix #1 today.

## 2022-11-13 NOTE — Assessment & Plan Note (Signed)
BP Readings from Last 1 Encounters:  11/13/22 (!) 146/82   Uncontrolled due to noncompliance On lisinopril and metoprolol, but has not been taking it Increased lisinopril dose to 10 mg QD Counseled for compliance with the medications Advised DASH diet and moderate exercise/walking, at least 150 mins/week

## 2022-11-13 NOTE — Assessment & Plan Note (Signed)
Has recurrent hot flashes Not a candidate for HRT due to smoking Started Veozah, sample provided

## 2022-11-13 NOTE — Assessment & Plan Note (Signed)
H/o Graves disease, s/p radio iodine ablation On levothyroxine 100 mcg QD, was taking it inconsistently, now takes it regularly Followed by Dr. Dorris Fetch - but has lost follow-up Check TSH and free T4

## 2022-11-13 NOTE — Assessment & Plan Note (Signed)
On statin Check lipid profile 

## 2022-11-14 ENCOUNTER — Encounter: Payer: Self-pay | Admitting: Cardiology

## 2022-11-14 LAB — HEMOGLOBIN A1C
Est. average glucose Bld gHb Est-mCnc: 137 mg/dL
Hgb A1c MFr Bld: 6.4 % — ABNORMAL HIGH (ref 4.8–5.6)

## 2022-11-14 LAB — CMP14+EGFR
ALT: 15 IU/L (ref 0–32)
AST: 21 IU/L (ref 0–40)
Albumin/Globulin Ratio: 1.3 (ref 1.2–2.2)
Albumin: 4.1 g/dL (ref 3.8–4.9)
Alkaline Phosphatase: 126 IU/L — ABNORMAL HIGH (ref 44–121)
BUN/Creatinine Ratio: 14 (ref 9–23)
BUN: 13 mg/dL (ref 6–24)
Bilirubin Total: 0.3 mg/dL (ref 0.0–1.2)
CO2: 25 mmol/L (ref 20–29)
Calcium: 9.4 mg/dL (ref 8.7–10.2)
Chloride: 102 mmol/L (ref 96–106)
Creatinine, Ser: 0.9 mg/dL (ref 0.57–1.00)
Globulin, Total: 3.1 g/dL (ref 1.5–4.5)
Glucose: 79 mg/dL (ref 70–99)
Potassium: 4.2 mmol/L (ref 3.5–5.2)
Sodium: 140 mmol/L (ref 134–144)
Total Protein: 7.2 g/dL (ref 6.0–8.5)
eGFR: 76 mL/min/{1.73_m2} (ref >59)

## 2022-11-14 LAB — CBC WITH DIFFERENTIAL/PLATELET
Basophils Absolute: 0.1 10*3/uL (ref 0.0–0.2)
Basos: 1 %
EOS (ABSOLUTE): 0.2 10*3/uL (ref 0.0–0.4)
Eos: 3 %
Hematocrit: 42.5 % (ref 34.0–46.6)
Hemoglobin: 14.4 g/dL (ref 11.1–15.9)
Immature Grans (Abs): 0 10*3/uL (ref 0.0–0.1)
Immature Granulocytes: 0 %
Lymphocytes Absolute: 3 10*3/uL (ref 0.7–3.1)
Lymphs: 39 %
MCH: 34 pg — ABNORMAL HIGH (ref 26.6–33.0)
MCHC: 33.9 g/dL (ref 31.5–35.7)
MCV: 101 fL — ABNORMAL HIGH (ref 79–97)
Monocytes Absolute: 0.7 10*3/uL (ref 0.1–0.9)
Monocytes: 10 %
Neutrophils Absolute: 3.5 10*3/uL (ref 1.4–7.0)
Neutrophils: 47 %
Platelets: 223 10*3/uL (ref 150–450)
RBC: 4.23 x10E6/uL (ref 3.77–5.28)
RDW: 11.4 % — ABNORMAL LOW (ref 11.7–15.4)
WBC: 7.5 10*3/uL (ref 3.4–10.8)

## 2022-11-14 LAB — TSH+FREE T4
Free T4: 1.41 ng/dL (ref 0.82–1.77)
TSH: 13.6 u[IU]/mL — ABNORMAL HIGH (ref 0.450–4.500)

## 2022-11-14 LAB — VITAMIN D 25 HYDROXY (VIT D DEFICIENCY, FRACTURES): Vit D, 25-Hydroxy: 41.3 ng/mL (ref 30.0–100.0)

## 2022-11-18 ENCOUNTER — Other Ambulatory Visit: Payer: Self-pay | Admitting: Internal Medicine

## 2022-11-18 DIAGNOSIS — E890 Postprocedural hypothyroidism: Secondary | ICD-10-CM

## 2022-11-18 MED ORDER — LEVOTHYROXINE SODIUM 100 MCG PO TABS: 100.0000 ug | ORAL_TABLET | Freq: Every day | ORAL | 1 refills | Status: DC

## 2022-11-21 ENCOUNTER — Encounter: Payer: Self-pay | Admitting: Radiology

## 2022-11-26 ENCOUNTER — Ambulatory Visit: Payer: Managed Care, Other (non HMO) | Admitting: Family Medicine

## 2022-12-10 ENCOUNTER — Ambulatory Visit: Payer: Managed Care, Other (non HMO) | Admitting: Family Medicine

## 2022-12-15 ENCOUNTER — Other Ambulatory Visit: Payer: Self-pay | Admitting: Internal Medicine

## 2022-12-15 DIAGNOSIS — J4530 Mild persistent asthma, uncomplicated: Secondary | ICD-10-CM

## 2022-12-27 ENCOUNTER — Other Ambulatory Visit (HOSPITAL_COMMUNITY)
Admission: RE | Admit: 2022-12-27 | Discharge: 2022-12-27 | Disposition: A | Discharge status: Home / Self Care | Payer: Managed Care, Other (non HMO) | Source: Ambulatory Visit | Attending: Family Medicine | Admitting: Family Medicine

## 2022-12-27 ENCOUNTER — Ambulatory Visit (INDEPENDENT_AMBULATORY_CARE_PROVIDER_SITE_OTHER): Payer: Managed Care, Other (non HMO) | Admitting: Family Medicine

## 2022-12-27 ENCOUNTER — Encounter: Payer: Self-pay | Admitting: Family Medicine

## 2022-12-27 VITALS — BP 148/88

## 2022-12-27 DIAGNOSIS — Z124 Encounter for screening for malignant neoplasm of cervix: Secondary | ICD-10-CM

## 2022-12-27 NOTE — Progress Notes (Signed)
Established Patient Office Visit  Subjective:  Patient ID: Linda Lin, female    DOB: [birth year]  Age: 52 y.o. MRN: [id number]  CC:  Chief Complaint  Patient presents with   Gynecologic Exam    Pap today    HPI Linda Lin is a 52 y.o. femalepresents for Pap smear examination.  Past Medical History:  Diagnosis Date   Asthma    Cellulitis    Diabetes mellitus without complication    Heart attack 2021   Scabies    Thyroid disorder     Past Surgical History:  Procedure Laterality Date   CARPOMETACARPEL SUSPENSION PLASTY Right 10/16/2022   Procedure: THUMB TRAPEZIECTOMY WITH SUSPENSION PLASTY;  Surgeon: Marlyne Beards, MD;  Location: [facility];  Service: Orthopedics;  Laterality: Right;   LEFT HEART CATH AND CORONARY ANGIOGRAPHY N/A 06/09/2020   Procedure: LEFT HEART CATH AND CORONARY ANGIOGRAPHY;  Surgeon: Iran Ouch, MD;  Location: [facility];  Service: Cardiovascular;  Laterality: N/A;    Family History  Problem Relation Age of Onset   Asthma Mother    Diabetes Father    Kidney failure Father    Hypertension Sister    CAD Maternal Grandmother    Breast cancer Cousin    Diabetes Mellitus II Brother    Hypertension Brother    Cancer Neg Hx     Social History   Socioeconomic History   Marital status: Single    Spouse name: Not on file   Number of children: Not on file   Years of education: Not on file   Highest education level: Not on file  Occupational History   Occupation: Landscape architect: [employer]  Tobacco Use   Smoking status: Every Day    Packs/day: 0.25    Years: 20.00    Additional pack years: 0.00    Total pack years: 5.00    Types: Cigarettes    Start date: 10/14/1987   Smokeless tobacco: Never  Vaping Use   Vaping Use: Never used  Substance and Sexual Activity   Alcohol use: Yes    Comment: occ   Drug use: Not Currently    Types: Marijuana    Comment: sporadically-last  used sept 2019   Sexual activity: Not Currently    Partners: Male    Birth control/protection: None  Other Topics Concern   Not on file  Social History Narrative   Pt lives w/ her sister.   Supervisor.    Social Determinants of Health   Financial Resource Strain: Not on file  Food Insecurity: Not on file  Transportation Needs: Not on file  Physical Activity: Not on file  Stress: Not on file  Social Connections: Not on file  Intimate Partner Violence: Not on file    Outpatient Medications Prior to Visit  Medication Sig Dispense Refill   acetaminophen (TYLENOL) 500 MG tablet Take 500 mg by mouth every 6 (six) hours as needed for mild pain or moderate pain.      albuterol (VENTOLIN HFA) 108 (90 Base) MCG/ACT inhaler INHALE 2 PUFFS BY MOUTH EVERY 6 HOURS AS NEEDED FOR WHEEZING OR SHORTNESS OF BREATH 9 g 0   aspirin 81 MG chewable tablet Chew 1 tablet (81 mg total) by mouth daily.     celecoxib (CELEBREX) 100 MG capsule Take 1 capsule (100 mg total) by mouth 2 (two) times daily as needed for mild pain or moderate pain. 30 capsule 2  diclofenac (VOLTAREN) 75 MG EC tablet Take 1 tablet by mouth 2 (two) times daily.     Fezolinetant (VEOZAH) 45 MG TABS Take 1 tablet (45 mg total) by mouth daily. 30 tablet 5   fluticasone-salmeterol (ADVAIR) 100-50 MCG/ACT AEPB INHALE 1 PUFF BY MOUTH TWICE DAILY 180 each 0   ibuprofen (ADVIL) 200 MG tablet Take 200 mg by mouth every 6 (six) hours as needed for headache.     levothyroxine (SYNTHROID) 100 MCG tablet Take 1 tablet (100 mcg total) by mouth daily before breakfast. 90 tablet 1   lisinopril (ZESTRIL) 10 MG tablet Take 1 tablet (10 mg total) by mouth daily. 90 tablet 1   metFORMIN (GLUCOPHAGE) 500 MG tablet Take 1 tablet (500 mg total) by mouth 2 (two) times daily with a meal. 180 tablet 1   metoprolol tartrate (LOPRESSOR) 25 MG tablet Take 1 tablet (25 mg total) by mouth 2 (two) times daily. 180 tablet 1   rosuvastatin (CRESTOR) 40 MG tablet Take  1 tablet (40 mg total) by mouth daily. 90 tablet 3   Vitamin D, Ergocalciferol, (DRISDOL) 1.25 MG (50000 UNIT) CAPS capsule Take 1 capsule (50,000 Units total) by mouth every 7 (seven) days. 12 capsule 1   albuterol (PROVENTIL) (2.5 MG/3ML) 0.083% nebulizer solution Take 3 mLs (2.5 mg total) by nebulization every 4 (four) hours as needed for wheezing or shortness of breath. 75 mL 2   oxyCODONE (OXY IR/ROXICODONE) 5 MG immediate release tablet TAKE 1 TABLET BY MOUTH EVERY 4 TO 6 HOURS FOR 7 DAYS     No facility-administered medications prior to visit.    Allergies  Allergen Reactions   Other Hives    shellfish   Cephalexin Rash   Doxycycline Rash    ROS Review of Systems  Constitutional:  Negative for chills and fever.  Eyes:  Negative for visual disturbance.  Respiratory:  Negative for chest tightness and shortness of breath.   Genitourinary:  Negative for menstrual problem, vaginal discharge and vaginal pain.  Neurological:  Negative for dizziness and headaches.      Objective:    Physical Exam Genitourinary:    General: Normal vulva.     Tanner stage (genital): 5.     Labia:        Right: No rash, tenderness, lesion or injury.        Left: No rash, tenderness, lesion or injury.      Urethra: No prolapse, urethral pain, urethral swelling or urethral lesion.     Comments: Vaginal wall: pink and rugated, smooth and non-tender; absence of lesions, edema, and erythema. Labia Majora and Minora: present bilaterally, moist, soft tissue, and homogeneous; free of edema and ulcerations. Clitoris is anatomically present, above the urethral, and free of lesions, masses, and ulceration.       BP (!) 148/88 (BP Location: Left Arm)   LMP 05/08/2020  Wt Readings from Last 3 Encounters:  11/13/22 175 lb (79.4 kg)  10/16/22 176 lb 12.9 oz (80.2 kg)  07/31/22 177 lb 9.6 oz (80.6 kg)    Lab Results  Component Value Date   TSH 13.600 (H) 11/13/2022   Lab Results  Component Value  Date   WBC 7.5 11/13/2022   HGB 14.4 11/13/2022   HCT 42.5 11/13/2022   MCV 101 (H) 11/13/2022   PLT 223 11/13/2022   Lab Results  Component Value Date   NA 140 11/13/2022   K 4.2 11/13/2022   CO2 25 11/13/2022   GLUCOSE 79 11/13/2022  BUN 13 11/13/2022   CREATININE 0.90 11/13/2022   BILITOT 0.3 11/13/2022   ALKPHOS 126 (H) 11/13/2022   AST 21 11/13/2022   ALT 15 11/13/2022   PROT 7.2 11/13/2022   ALBUMIN 4.1 11/13/2022   CALCIUM 9.4 11/13/2022   ANIONGAP 9 10/15/2022   EGFR 76 11/13/2022   Lab Results  Component Value Date   CHOL 176 08/29/2021   Lab Results  Component Value Date   HDL 60 08/29/2021   Lab Results  Component Value Date   LDLCALC 103 (H) 08/29/2021   Lab Results  Component Value Date   TRIG 66 08/29/2021   Lab Results  Component Value Date   CHOLHDL 2.9 08/29/2021   Lab Results  Component Value Date   HGBA1C 6.4 (H) 11/13/2022      Assessment & Plan:  Cervical cancer screening Assessment & Plan: -Cytology and HPV co-testing (preferred) every 5 years or cytology alone (acceptable) every 3 years. - Pap due 2029     Orders: -     Cytology - PAP    Follow-up: Return if symptoms worsen or fail to improve.   Gilmore Laroche, FNP

## 2022-12-27 NOTE — Assessment & Plan Note (Signed)
Cytology and HPV co-testing (preferred) every 5 years or cytology alone (acceptable) every 3 years. - Pap due 2029   

## 2022-12-27 NOTE — Patient Instructions (Signed)
I appreciate the opportunity to provide care to you today!    Follow up:  Dr. Allena Katz  We will let you know the results of your Pap examination on MyChart   Please continue to a heart-healthy diet and increase your physical activities. Try to exercise for at least five days a week.      It was a pleasure to see you and I look forward to continuing to work together on your health and well-being. Please do not hesitate to call the office if you need care or have questions about your care.   Have a wonderful day and week. With Gratitude, Gilmore Laroche MSN, FNP-BC

## 2022-12-31 LAB — CYTOLOGY - PAP
Adequacy: ABSENT
Chlamydia: NEGATIVE
Comment: NEGATIVE
Comment: NEGATIVE
Comment: NEGATIVE
Comment: NORMAL
Diagnosis: NEGATIVE
High risk HPV: NEGATIVE
Neisseria Gonorrhea: NEGATIVE
Trichomonas: POSITIVE — AB

## 2023-01-01 ENCOUNTER — Other Ambulatory Visit: Payer: Self-pay | Admitting: Family Medicine

## 2023-01-01 DIAGNOSIS — A599 Trichomoniasis, unspecified: Secondary | ICD-10-CM

## 2023-01-01 MED ORDER — METRONIDAZOLE 500 MG PO TABS: 500.0000 mg | ORAL_TABLET | Freq: Two times a day (BID) | ORAL | 0 refills | Status: AC

## 2023-01-21 ENCOUNTER — Other Ambulatory Visit: Payer: Self-pay | Admitting: Internal Medicine

## 2023-01-21 DIAGNOSIS — I2510 Atherosclerotic heart disease of native coronary artery without angina pectoris: Secondary | ICD-10-CM

## 2023-01-21 DIAGNOSIS — I10 Essential (primary) hypertension: Secondary | ICD-10-CM

## 2023-01-21 DIAGNOSIS — E118 Type 2 diabetes mellitus with unspecified complications: Secondary | ICD-10-CM

## 2023-03-13 ENCOUNTER — Ambulatory Visit: Payer: Managed Care, Other (non HMO) | Admitting: Internal Medicine

## 2023-03-20 ENCOUNTER — Other Ambulatory Visit: Payer: Self-pay | Admitting: Internal Medicine

## 2023-03-20 DIAGNOSIS — J4530 Mild persistent asthma, uncomplicated: Secondary | ICD-10-CM

## 2023-04-20 ENCOUNTER — Other Ambulatory Visit: Payer: Self-pay | Admitting: Internal Medicine

## 2023-04-20 DIAGNOSIS — E118 Type 2 diabetes mellitus with unspecified complications: Secondary | ICD-10-CM

## 2023-04-20 DIAGNOSIS — I2510 Atherosclerotic heart disease of native coronary artery without angina pectoris: Secondary | ICD-10-CM

## 2023-04-20 DIAGNOSIS — I10 Essential (primary) hypertension: Secondary | ICD-10-CM

## 2023-06-21 ENCOUNTER — Other Ambulatory Visit: Payer: Self-pay | Admitting: Internal Medicine

## 2023-06-21 DIAGNOSIS — J4530 Mild persistent asthma, uncomplicated: Secondary | ICD-10-CM

## 2023-07-20 ENCOUNTER — Other Ambulatory Visit: Payer: Self-pay | Admitting: Internal Medicine

## 2023-07-20 DIAGNOSIS — E890 Postprocedural hypothyroidism: Secondary | ICD-10-CM

## 2023-09-22 ENCOUNTER — Other Ambulatory Visit: Payer: Self-pay | Admitting: Internal Medicine

## 2023-09-22 DIAGNOSIS — J4530 Mild persistent asthma, uncomplicated: Secondary | ICD-10-CM

## 2023-10-22 ENCOUNTER — Other Ambulatory Visit: Payer: Self-pay | Admitting: Internal Medicine

## 2023-10-22 DIAGNOSIS — E890 Postprocedural hypothyroidism: Secondary | ICD-10-CM

## 2024-01-17 ENCOUNTER — Other Ambulatory Visit: Payer: Self-pay | Admitting: Internal Medicine

## 2024-01-17 DIAGNOSIS — E890 Postprocedural hypothyroidism: Secondary | ICD-10-CM

## 2024-02-06 ENCOUNTER — Emergency Department (HOSPITAL_COMMUNITY): Payer: Self-pay

## 2024-02-06 ENCOUNTER — Ambulatory Visit: Payer: Self-pay | Admitting: *Deleted

## 2024-02-06 ENCOUNTER — Encounter (HOSPITAL_COMMUNITY): Payer: Self-pay

## 2024-02-06 ENCOUNTER — Other Ambulatory Visit: Payer: Self-pay

## 2024-02-06 ENCOUNTER — Observation Stay (HOSPITAL_COMMUNITY)
Admission: EM | Admit: 2024-02-06 | Discharge: 2024-02-07 | Disposition: A | Discharge status: Home / Self Care | Payer: Self-pay | Attending: Family Medicine | Admitting: Family Medicine

## 2024-02-06 DIAGNOSIS — I6389 Other cerebral infarction: Principal | ICD-10-CM | POA: Insufficient documentation

## 2024-02-06 DIAGNOSIS — Z7982 Long term (current) use of aspirin: Secondary | ICD-10-CM | POA: Insufficient documentation

## 2024-02-06 DIAGNOSIS — J4530 Mild persistent asthma, uncomplicated: Secondary | ICD-10-CM

## 2024-02-06 DIAGNOSIS — F17210 Nicotine dependence, cigarettes, uncomplicated: Secondary | ICD-10-CM | POA: Insufficient documentation

## 2024-02-06 DIAGNOSIS — I700 Atherosclerosis of aorta: Secondary | ICD-10-CM | POA: Insufficient documentation

## 2024-02-06 DIAGNOSIS — Z79899 Other long term (current) drug therapy: Secondary | ICD-10-CM | POA: Insufficient documentation

## 2024-02-06 DIAGNOSIS — E890 Postprocedural hypothyroidism: Secondary | ICD-10-CM | POA: Diagnosis present

## 2024-02-06 DIAGNOSIS — I2510 Atherosclerotic heart disease of native coronary artery without angina pectoris: Secondary | ICD-10-CM | POA: Diagnosis present

## 2024-02-06 DIAGNOSIS — E039 Hypothyroidism, unspecified: Secondary | ICD-10-CM | POA: Insufficient documentation

## 2024-02-06 DIAGNOSIS — E782 Mixed hyperlipidemia: Secondary | ICD-10-CM

## 2024-02-06 DIAGNOSIS — I639 Cerebral infarction, unspecified: Principal | ICD-10-CM | POA: Diagnosis present

## 2024-02-06 DIAGNOSIS — E785 Hyperlipidemia, unspecified: Secondary | ICD-10-CM | POA: Diagnosis present

## 2024-02-06 DIAGNOSIS — E0500 Thyrotoxicosis with diffuse goiter without thyrotoxic crisis or storm: Secondary | ICD-10-CM | POA: Diagnosis present

## 2024-02-06 DIAGNOSIS — E66811 Obesity, class 1: Secondary | ICD-10-CM | POA: Insufficient documentation

## 2024-02-06 DIAGNOSIS — I10 Essential (primary) hypertension: Secondary | ICD-10-CM | POA: Diagnosis present

## 2024-02-06 DIAGNOSIS — I214 Non-ST elevation (NSTEMI) myocardial infarction: Secondary | ICD-10-CM | POA: Diagnosis present

## 2024-02-06 DIAGNOSIS — E118 Type 2 diabetes mellitus with unspecified complications: Secondary | ICD-10-CM | POA: Diagnosis present

## 2024-02-06 DIAGNOSIS — Z7984 Long term (current) use of oral hypoglycemic drugs: Secondary | ICD-10-CM | POA: Insufficient documentation

## 2024-02-06 DIAGNOSIS — J449 Chronic obstructive pulmonary disease, unspecified: Secondary | ICD-10-CM | POA: Insufficient documentation

## 2024-02-06 DIAGNOSIS — J45909 Unspecified asthma, uncomplicated: Secondary | ICD-10-CM | POA: Insufficient documentation

## 2024-02-06 LAB — I-STAT CHEM 8, ED
BUN: 18 mg/dL (ref 6–20)
Calcium, Ion: 1.22 mmol/L (ref 1.15–1.40)
Chloride: 102 mmol/L (ref 98–111)
Creatinine, Ser: 1.1 mg/dL — ABNORMAL HIGH (ref 0.44–1.00)
Glucose, Bld: 69 mg/dL — ABNORMAL LOW (ref 70–99)
HCT: 48 % — ABNORMAL HIGH (ref 36.0–46.0)
Hemoglobin: 16.3 g/dL — ABNORMAL HIGH (ref 12.0–15.0)
Potassium: 3.9 mmol/L (ref 3.5–5.1)
Sodium: 141 mmol/L (ref 135–145)
TCO2: 26 mmol/L (ref 22–32)

## 2024-02-06 LAB — DIFFERENTIAL
Abs Immature Granulocytes: 0.01 10*3/uL (ref 0.00–0.07)
Basophils Absolute: 0 10*3/uL (ref 0.0–0.1)
Basophils Relative: 1 %
Eosinophils Absolute: 0.2 10*3/uL (ref 0.0–0.5)
Eosinophils Relative: 3 %
Immature Granulocytes: 0 %
Lymphocytes Relative: 44 %
Lymphs Abs: 3.1 10*3/uL (ref 0.7–4.0)
Monocytes Absolute: 0.5 10*3/uL (ref 0.1–1.0)
Monocytes Relative: 8 %
Neutro Abs: 3.1 10*3/uL (ref 1.7–7.7)
Neutrophils Relative %: 44 %

## 2024-02-06 LAB — COMPREHENSIVE METABOLIC PANEL WITH GFR
ALT: 15 U/L (ref 0–44)
AST: 19 U/L (ref 15–41)
Albumin: 4.1 g/dL (ref 3.5–5.0)
Alkaline Phosphatase: 115 U/L (ref 38–126)
Anion gap: 11 (ref 5–15)
BUN: 17 mg/dL (ref 6–20)
CO2: 25 mmol/L (ref 22–32)
Calcium: 9.7 mg/dL (ref 8.9–10.3)
Chloride: 99 mmol/L (ref 98–111)
Creatinine, Ser: 1.07 mg/dL — ABNORMAL HIGH (ref 0.44–1.00)
GFR, Estimated: >60 mL/min (ref >60)
Glucose, Bld: 70 mg/dL (ref 70–99)
Potassium: 3.7 mmol/L (ref 3.5–5.1)
Sodium: 135 mmol/L (ref 135–145)
Total Bilirubin: 0.9 mg/dL (ref 0.0–1.2)
Total Protein: 8.8 g/dL — ABNORMAL HIGH (ref 6.5–8.1)

## 2024-02-06 LAB — CBC
HCT: 45.5 % (ref 36.0–46.0)
Hemoglobin: 14.9 g/dL (ref 12.0–15.0)
MCH: 33.6 pg (ref 26.0–34.0)
MCHC: 32.7 g/dL (ref 30.0–36.0)
MCV: 102.5 fL — ABNORMAL HIGH (ref 80.0–100.0)
Platelets: 274 10*3/uL (ref 150–400)
RBC: 4.44 MIL/uL (ref 3.87–5.11)
RDW: 13 % (ref 11.5–15.5)
WBC: 6.9 10*3/uL (ref 4.0–10.5)
nRBC: 0 % (ref 0.0–0.2)

## 2024-02-06 LAB — PROTIME-INR
INR: 0.9 (ref 0.8–1.2)
Prothrombin Time: 12.4 s (ref 11.4–15.2)

## 2024-02-06 LAB — ETHANOL: Alcohol, Ethyl (B): <15 mg/dL (ref <15)

## 2024-02-06 LAB — GLUCOSE, CAPILLARY: Glucose-Capillary: 179 mg/dL — ABNORMAL HIGH (ref 70–99)

## 2024-02-06 LAB — APTT: aPTT: 26 s (ref 24–36)

## 2024-02-06 MED ORDER — ENOXAPARIN SODIUM 40 MG/0.4ML IJ SOSY
40.0000 mg | PREFILLED_SYRINGE | INTRAMUSCULAR | Status: DC
  Administered 2024-02-06: 40 mg via SUBCUTANEOUS
  Filled 2024-02-06: qty 0.4

## 2024-02-06 MED ORDER — STROKE: EARLY STAGES OF RECOVERY BOOK: Freq: Once | Status: AC

## 2024-02-06 MED ORDER — ACETAMINOPHEN 650 MG RE SUPP: 650.0000 mg | RECTAL | Status: DC | PRN

## 2024-02-06 MED ORDER — INSULIN ASPART 100 UNIT/ML IJ SOLN: 0.0000 [IU] | Freq: Every day | INTRAMUSCULAR | Status: DC

## 2024-02-06 MED ORDER — CLOPIDOGREL BISULFATE 75 MG PO TABS
75.0000 mg | ORAL_TABLET | Freq: Once | ORAL | Status: AC
  Administered 2024-02-06: 75 mg via ORAL
  Filled 2024-02-06: qty 1

## 2024-02-06 MED ORDER — INSULIN ASPART 100 UNIT/ML IJ SOLN: 0.0000 [IU] | Freq: Three times a day (TID) | INTRAMUSCULAR | Status: DC

## 2024-02-06 MED ORDER — ALBUTEROL SULFATE (2.5 MG/3ML) 0.083% IN NEBU: 2.5000 mg | INHALATION_SOLUTION | Freq: Four times a day (QID) | RESPIRATORY_TRACT | Status: DC | PRN

## 2024-02-06 MED ORDER — ACETAMINOPHEN 325 MG PO TABS: 650.0000 mg | ORAL_TABLET | ORAL | Status: DC | PRN

## 2024-02-06 MED ORDER — FLUTICASONE FUROATE-VILANTEROL 100-25 MCG/ACT IN AEPB
1.0000 | INHALATION_SPRAY | Freq: Every day | RESPIRATORY_TRACT | Status: DC
  Administered 2024-02-07: 1 via RESPIRATORY_TRACT
  Filled 2024-02-06: qty 28

## 2024-02-06 MED ORDER — ROSUVASTATIN CALCIUM 20 MG PO TABS
40.0000 mg | ORAL_TABLET | Freq: Every day | ORAL | Status: DC
  Administered 2024-02-06 – 2024-02-07 (×2): 40 mg via ORAL
  Filled 2024-02-06 (×2): qty 2

## 2024-02-06 MED ORDER — SODIUM CHLORIDE 0.9% FLUSH: 3.0000 mL | INTRAVENOUS | Status: DC | PRN

## 2024-02-06 MED ORDER — LEVOTHYROXINE SODIUM 100 MCG PO TABS
100.0000 ug | ORAL_TABLET | Freq: Every day | ORAL | Status: DC
  Administered 2024-02-07: 100 ug via ORAL
  Filled 2024-02-06: qty 1

## 2024-02-06 MED ORDER — ASPIRIN 81 MG PO CHEW
81.0000 mg | CHEWABLE_TABLET | Freq: Every day | ORAL | Status: DC
  Administered 2024-02-07: 81 mg via ORAL
  Filled 2024-02-06: qty 1

## 2024-02-06 MED ORDER — SODIUM CHLORIDE 0.9% FLUSH
3.0000 mL | Freq: Two times a day (BID) | INTRAVENOUS | Status: DC
  Administered 2024-02-06 – 2024-02-07 (×2): 10 mL via INTRAVENOUS

## 2024-02-06 MED ORDER — SENNOSIDES-DOCUSATE SODIUM 8.6-50 MG PO TABS: 1.0000 | ORAL_TABLET | Freq: Every evening | ORAL | Status: DC | PRN

## 2024-02-06 MED ORDER — ACETAMINOPHEN 160 MG/5ML PO SOLN: 650.0000 mg | ORAL | Status: DC | PRN

## 2024-02-06 MED ORDER — ASPIRIN 81 MG PO CHEW
324.0000 mg | CHEWABLE_TABLET | Freq: Once | ORAL | Status: AC
  Administered 2024-02-06: 324 mg via ORAL
  Filled 2024-02-06: qty 4

## 2024-02-06 NOTE — Telephone Encounter (Signed)
  Chief Complaint: left side body numbness Symptoms: last night mouth, lips to ear numbness after taking ibuprofen . Reports this am noted left hand arm and leg numbness can move left side. Can walk.  Frequency: last night  Pertinent Negatives: Patient denies inability to move left side. No blurred vision no slurred speech.  Disposition: [x] ED /[] Urgent Care (no appt availability in office) / [] Appointment(In office/virtual)/ []  [facility]/ [] Home Care/ [] Refused Recommended Disposition /[] [facility]/ []  Follow-up with PCP Additional Notes:   Recommended ED now and if worsen sx call 911. Patient reports she has someone to take her to ED now. Called patient back and reinforced if sx worsen call 911 and go directly to nearest ED.       Copied from CRM [id number]. Topic: Clinical - Red Word Triage >> Feb 06, 2024 12:59 PM Star [place] wrote: Red Word that prompted transfer to Nurse Triage: 20 minutes after taking Ibuprofen  last night left side of body went numb Reason for Disposition  [1] Numbness (i.e., loss of sensation) of the face, arm / hand, or leg / foot on one side of the body AND [2] sudden onset AND [3] brief (now gone)    Symptoms started last night and present now  Answer Assessment - Initial Assessment Questions 1. SYMPTOM: "What is the main symptom you are concerned about?" (e.g., weakness, numbness)     Left side of body numb hand arm leg.  2. ONSET: "When did this start?" (minutes, hours, days; while sleeping)     Last night  3. LAST NORMAL: "When was the last time you (the patient) were normal (no symptoms)?"     Last night before taking ibuprofen .  4. PATTERN "Does this come and go, or has it been constant since it started?"  "Is it present now?"     Present now  5. CARDIAC SYMPTOMS: "Have you had any of the following symptoms: chest pain, difficulty breathing, palpitations?"     no 6. NEUROLOGIC SYMPTOMS: "Have you had any of the following symptoms:  headache, dizziness, vision loss, double vision, changes in speech, unsteady on your feet?"     Last night mouth, lips, to ear numb. Now left side of body hand arm leg feel numb can move and walk. Dizziness turning head 7. OTHER SYMPTOMS: "Do you have any other symptoms?"     na 8. PREGNANCY: "Is there any chance you are pregnant?" "When was your last menstrual period?"     na  Protocols used: Neurologic Deficit-A-AH

## 2024-02-06 NOTE — H&P (Signed)
 History and Physical    Patient: Linda Lin GMW:[id number] DOB: 27-Dec-1970 DOA: 02/06/2024 DOS: the patient was seen and examined on 02/06/2024 PCP: Meldon Sport, MD  Patient coming from: Home  Chief Complaint:  Chief Complaint  Patient presents with   Numbness   HPI: Linda Lin is a 53 y.o. female with medical history significant of diabetes, hypertension, hypothyroidism, tobacco abuse.  Patient presents with left-sided weakness that started this morning and has continued throughout the day.  She has noted no motor weaknesses.  Denies headache, slurred speech, vision changes, fevers, chills, lightheadedness or dizziness.  She does not drink alcohol.  She has been smoking a little more frequently since she has lost her job.  Review of Systems: As mentioned in the history of present illness. All other systems reviewed and are negative. Past Medical History:  Diagnosis Date   Asthma    Cellulitis    Diabetes mellitus without complication (HCC)    Heart attack (HCC) 2021   Scabies    Thyroid  disorder    Past Surgical History:  Procedure Laterality Date   CARPOMETACARPEL SUSPENSION PLASTY Right 10/16/2022   Procedure: THUMB TRAPEZIECTOMY WITH SUSPENSION PLASTY;  Surgeon: Marilyn Shropshire, MD;  Location: [facility];  Service: Orthopedics;  Laterality: Right;   LEFT HEART CATH AND CORONARY ANGIOGRAPHY N/A 06/09/2020   Procedure: LEFT HEART CATH AND CORONARY ANGIOGRAPHY;  Surgeon: Wenona Hamilton, MD;  Location: [facility];  Service: Cardiovascular;  Laterality: N/A;   Social History:  reports that she has been smoking cigarettes. She started smoking about 36 years ago. She has a 9.1 pack-year smoking history. She has never used smokeless tobacco. She reports current alcohol use. She reports that she does not currently use drugs after having used the following drugs: Marijuana.  Allergies  Allergen Reactions   Other Hives    shellfish    Cephalexin  Rash   Doxycycline  Rash    Family History  Problem Relation Age of Onset   Asthma Mother    Diabetes Father    Kidney failure Father    Hypertension Sister    CAD Maternal Grandmother    Breast cancer Cousin    Diabetes Mellitus II Brother    Hypertension Brother    Cancer Neg Hx     Prior to Admission medications   Medication Sig Start Date End Date Taking? Authorizing Provider  acetaminophen  (TYLENOL ) 500 MG tablet Take 500 mg by mouth every 6 (six) hours as needed for mild pain or moderate pain.     [provider]  albuterol  (PROVENTIL ) (2.5 MG/3ML) 0.083% nebulizer solution Take 3 mLs (2.5 mg total) by nebulization every 4 (four) hours as needed for wheezing or shortness of breath. 11/25/20 07/31/22  Gwendalyn Lemma, MD  albuterol  (VENTOLIN  HFA) 108 (90 Base) MCG/ACT inhaler INHALE 2 PUFFS BY MOUTH EVERY 6 HOURS AS NEEDED FOR WHEEZING OR SHORTNESS OF BREATH 06/04/22   Meldon Sport, MD  aspirin  81 MG chewable tablet Chew 1 tablet (81 mg total) by mouth daily. 06/10/20   Goodrich, Callie E, PA-C  fluticasone -salmeterol (ADVAIR) 100-50 MCG/ACT AEPB INHALE 1 DOSE BY MOUTH TWICE DAILY 09/22/23   Patel, Rutwik K, MD  ibuprofen  (ADVIL ) 200 MG tablet Take 200 mg by mouth every 6 (six) hours as needed for headache.    [provider]  levothyroxine  (SYNTHROID ) 100 MCG tablet TAKE 1 TABLET BY MOUTH ONCE DAILY BEFORE BREAKFAST 01/19/24   Meldon Sport, MD  lisinopril  (  ZESTRIL ) 10 MG tablet Take 1 tablet (10 mg total) by mouth daily. 11/13/22 05/12/23  Meldon Sport, MD  lisinopril  (ZESTRIL ) 5 MG tablet Take 1 tablet by mouth once daily 01/21/23   Patel, Rutwik K, MD  metFORMIN  (GLUCOPHAGE ) 500 MG tablet TAKE 1 TABLET BY MOUTH TWICE DAILY WITH A MEAL 01/21/23   Patel, Rutwik K, MD  metoprolol  tartrate (LOPRESSOR ) 25 MG tablet Take 1 tablet by mouth twice daily 01/21/23   Patel, Rutwik K, MD  oxyCODONE  (OXY IR/ROXICODONE ) 5 MG immediate release tablet TAKE 1 TABLET BY  MOUTH EVERY 4 TO 6 HOURS FOR 7 DAYS    [provider]  rosuvastatin  (CRESTOR ) 40 MG tablet Take 1 tablet (40 mg total) by mouth daily. 11/13/22   Meldon Sport, MD  Vitamin D , Ergocalciferol , (DRISDOL ) 1.25 MG (50000 UNIT) CAPS capsule Take 1 capsule (50,000 Units total) by mouth every 7 (seven) days. 01/01/22   Meldon Sport, MD    Physical Exam: Vitals:   02/06/24 1715 02/06/24 1730 02/06/24 1739 02/06/24 1745  BP: 129/86 (!) 152/74  (!) 158/71  Pulse: 65 64  64  Resp: 20 16  13   Temp:   98.7 F (37.1 C)   TempSrc:   Oral   SpO2: 97% 96%  96%  Weight:      Height:       General: Middle-age female. Awake and alert and oriented x3. No acute cardiopulmonary distress.  HEENT: Normocephalic atraumatic.  Right and left ears normal in appearance.  Pupils equal, round, reactive to light. Extraocular muscles are intact. Sclerae anicteric and noninjected.  Moist mucosal membranes. No mucosal lesions.  Neck: Neck supple without lymphadenopathy. No carotid bruits. No masses palpated.  Cardiovascular: Regular rate with normal S1-S2 sounds. No murmurs, rubs, gallops auscultated. No JVD.  Respiratory: Good respiratory effort with no wheezes, rales, rhonchi. Lungs clear to auscultation bilaterally.  No accessory muscle use. Abdomen: Soft, nontender, nondistended. Active bowel sounds. No masses or hepatosplenomegaly  Skin: No rashes, lesions, or ulcerations.  Dry, warm to touch. 2+ dorsalis pedis and radial pulses. Musculoskeletal: No calf or leg pain. All major joints not erythematous nontender.  No upper or lower joint deformation.  Good ROM.  No contractures  Psychiatric: Intact judgment and insight. Pleasant and cooperative. Neurologic: No focal neurological deficits. Strength is 5/5 and symmetric in upper and lower extremities.  Cranial nerves II through XII are grossly intact.   Data Reviewed: Results for orders placed or performed during the hospital encounter of 02/06/24 (from the  past 24 hours)  Ethanol     Status: None   Collection Time: 02/06/24  3:38 PM  Result Value Ref Range   Alcohol, Ethyl (B) <15 <15 mg/dL  Protime-INR     Status: None   Collection Time: 02/06/24  3:38 PM  Result Value Ref Range   Prothrombin Time 12.4 11.4 - 15.2 seconds   INR 0.9 0.8 - 1.2  APTT     Status: None   Collection Time: 02/06/24  3:38 PM  Result Value Ref Range   aPTT 26 24 - 36 seconds  CBC     Status: Abnormal   Collection Time: 02/06/24  3:38 PM  Result Value Ref Range   WBC 6.9 4.0 - 10.5 K/uL   RBC 4.44 3.87 - 5.11 MIL/uL   Hemoglobin 14.9 12.0 - 15.0 g/dL   HCT 63.8 75.6 - 43.3 %   MCV 102.5 (H) 80.0 - 100.0 fL   MCH 33.6  26.0 - 34.0 pg   MCHC 32.7 30.0 - 36.0 g/dL   RDW 16.1 09.6 - 04.5 %   Platelets 274 150 - 400 K/uL   nRBC 0.0 0.0 - 0.2 %  Differential     Status: None   Collection Time: 02/06/24  3:38 PM  Result Value Ref Range   Neutrophils Relative % 44 %   Neutro Abs 3.1 1.7 - 7.7 K/uL   Lymphocytes Relative 44 %   Lymphs Abs 3.1 0.7 - 4.0 K/uL   Monocytes Relative 8 %   Monocytes Absolute 0.5 0.1 - 1.0 K/uL   Eosinophils Relative 3 %   Eosinophils Absolute 0.2 0.0 - 0.5 K/uL   Basophils Relative 1 %   Basophils Absolute 0.0 0.0 - 0.1 K/uL   Immature Granulocytes 0 %   Abs Immature Granulocytes 0.01 0.00 - 0.07 K/uL  Comprehensive metabolic panel     Status: Abnormal   Collection Time: 02/06/24  3:38 PM  Result Value Ref Range   Sodium 135 135 - 145 mmol/L   Potassium 3.7 3.5 - 5.1 mmol/L   Chloride 99 98 - 111 mmol/L   CO2 25 22 - 32 mmol/L   Glucose, Bld 70 70 - 99 mg/dL   BUN 17 6 - 20 mg/dL   Creatinine, Ser 4.09 (H) 0.44 - 1.00 mg/dL   Calcium  9.7 8.9 - 10.3 mg/dL   Total Protein 8.8 (H) 6.5 - 8.1 g/dL   Albumin 4.1 3.5 - 5.0 g/dL   AST 19 15 - 41 U/L   ALT 15 0 - 44 U/L   Alkaline Phosphatase 115 38 - 126 U/L   Total Bilirubin 0.9 0.0 - 1.2 mg/dL   GFR, Estimated >81 >19 mL/min   Anion gap 11 5 - 15  I-stat chem 8, ED      Status: Abnormal   Collection Time: 02/06/24  3:41 PM  Result Value Ref Range   Sodium 141 135 - 145 mmol/L   Potassium 3.9 3.5 - 5.1 mmol/L   Chloride 102 98 - 111 mmol/L   BUN 18 6 - 20 mg/dL   Creatinine, Ser 1.47 (H) 0.44 - 1.00 mg/dL   Glucose, Bld 69 (L) 70 - 99 mg/dL   Calcium , Ion 1.22 1.15 - 1.40 mmol/L   TCO2 26 22 - 32 mmol/L   Hemoglobin 16.3 (H) 12.0 - 15.0 g/dL   HCT 82.9 (H) 56.2 - 13.0 %    MR BRAIN WO CONTRAST Result Date: 02/06/2024 CLINICAL DATA:  Provided history: Numbness or tingling, paresthesia. Additional history provided: Left-sided numbness, onset last night. EXAM: MRI HEAD WITHOUT CONTRAST TECHNIQUE: Multiplanar, multiecho pulse sequences of the brain and surrounding structures were obtained without intravenous contrast. COMPARISON:  Head CT performed earlier today 02/06/2024. Brain MRI 07/11/2017. FINDINGS: Brain: No age-advanced or lobar predominant cerebral atrophy. 5 mm acute infarct within the right thalamus (series 5, image 26). Chronic lacunar infarcts elsewhere within the bilateral thalami and left thalamocapsular junction. Chronic microhemorrhages also present within the thalami. Chronic lacunar infarcts within the right frontal lobe periventricular white matter and left corona radiata, new from the prior MRI. Background multifocal T2 FLAIR hyperintense signal abnormality within the cerebral white matter, overall moderate in severity but significantly greater than expected for age. Cavum septum pellucidum and cavum vergae (anatomic variant). Small chronic infarct within the right cerebellar hemisphere. No evidence of an intracranial mass. No extra-axial fluid collection. No midline shift. Vascular: Maintained flow voids within the proximal large arterial vessels. Skull  and upper cervical spine: No focal worrisome marrow lesion. Incompletely assessed cervical spondylosis. Sinuses/Orbits: No mass or acute finding within the imaged orbits. No significant paranasal  sinus disease. Other: Trace fluid within left mastoid air cells. 8 mm Tornwaldt cyst. IMPRESSION: 1. 5 mm acute infarct within the right thalamus. 2. Chronic lacunar infarcts within the thalami, left thalamocapsular junction, right frontal lobe periventricular white matter and left corona radiata, new from the prior MRI of 07/11/2017. A small chronic infarct is also now present within the right cerebellar hemisphere. 3. Background cerebral white matter chronic small vessel ischemic disease, overall moderate in severity and significantly greater than expected for age. These findings have progressed. 4. Chronic microhemorrhages within the thalami, new from the prior MRI and likely reflecting sequelae of chronic hypertensive microangiopathy. 5. Trace left mastoid effusion. Electronically Signed   By: Bascom Lily D.O.   On: 02/06/2024 17:18   CT HEAD WO CONTRAST Result Date: 02/06/2024 CLINICAL DATA:  Provided history: Numbness or tingling, paresthesia. Left-sided facial, arm and leg numbness. EXAM: CT HEAD WITHOUT CONTRAST TECHNIQUE: Contiguous axial images were obtained from the base of the skull through the vertex without intravenous contrast. RADIATION DOSE REDUCTION: This exam was performed according to the departmental dose-optimization program which includes automated exposure control, adjustment of the mA and/or kV according to patient size and/or use of iterative reconstruction technique. COMPARISON:  Brain MRI 07/11/2017. FINDINGS: Brain: No age-advanced or lobar predominant cerebral atrophy. Cavum septum pellucidum and cavum vergae (anatomic variant). Lacunar infarcts within the thalami bilaterally, new from the prior brain MRI of 07/11/2017 but otherwise age-indeterminate. Nonspecific chronic cerebral white matter and pontine disease is occult by CT and was better appreciated on the prior MRI. There is no acute intracranial hemorrhage. No demarcated cortical infarct. No extra-axial fluid collection. No  evidence of an intracranial mass. No midline shift. Vascular: No hyperdense vessel.  Atherosclerotic calcifications. Skull: No calvarial fracture or aggressive osseous lesion. Sinuses/Orbits: No mass or acute finding within the imaged orbits. No significant paranasal sinus disease at the imaged levels. IMPRESSION: 1. Lacunar infarcts within the bilateral thalami, new from the prior brain MRI of 07/11/2017 but otherwise age-indeterminate. Consider a brain MRI for further evaluation. 2. Nonspecific chronic cerebral white matter and pontine disease is occult by CT and was better appreciated on the prior MRI. Electronically Signed   By: Bascom Lily D.O.   On: 02/06/2024 14:41     Assessment and Plan: No notes have been filed under this hospital service. Service: Hospitalist  Principal Problem:   Ischemic stroke [facility]) Active Problems:   Graves' disease   Hypothyroidism following radioiodine therapy   NSTEMI (non-ST elevated myocardial infarction) (HCC)   CAD (coronary artery disease)   Hyperlipidemia   Type 2 diabetes mellitus with complication, without long-term current use of insulin  (HCC)   Essential hypertension  Ischemic stroke Observation on telemetry CTA head and neck Echocardiogram tomorrow Hemoglobin A1c, lipid panel in the morning PT/OT/speech therapy consult Continue aspirin  Plavix  per neurology Hypertension Rest of hypertension Type 2 diabetes Hold metformin  for now.  Sliding scale insulin  with CBGs AC and nightly Coronary artery disease with history of NSTEMI Continue statin Hypothyroidism Continue levothyroxine    Advance Care Planning:   Code Status: Full Code confirmed by patient  Consults: Neurology  Family Communication: None  Severity of Illness: The appropriate patient status for this patient is OBSERVATION. Observation status is judged to be reasonable and necessary in order to provide the required intensity of service to ensure  the patient's safety. The  patient's presenting symptoms, physical exam findings, and initial radiographic and laboratory data in the context of their medical condition is felt to place them at decreased risk for further clinical deterioration. Furthermore, it is anticipated that the patient will be medically stable for discharge from the hospital within 2 midnights of admission.   Author: Philbert Ocallaghan J Rigley Niess, DO 02/06/2024 7:09 PM  For on call review [email or web address].

## 2024-02-06 NOTE — ED Triage Notes (Signed)
 Pt stated that her whole left side has been numb since last night. Pt stated she is unsure if she is having reaction to Ibuprofen 

## 2024-02-06 NOTE — Discharge Instructions (Addendum)
 1)Take Aspirin  81 mg daily along with Plavix  75 mg daily for 21 days then after that STOP the Aspirin  and continue ONLY Plavix  75 mg daily indefinitely--for stroke prevention 2)Please take Losartan/HCTZ as prescribed in place of Lisinopril  blood pressure control 3)Metoprolol  Tartrate as been changed to metoprolol  succinate 50 mg once daily 4)Complete abstinence from tobacco strongly advised----you may use over-the-counter nicotine patch to help you quit smoking 5)Very Low-salt diet advised---Less than 2 gm of Sodium per day advised----ok to use Mrs DASH salt substitute instead of Salt 6)Avoid ibuprofen /Advil /Aleve /Motrin Juluis Ok Powders/Naproxen /BC powders/Meloxicam /Diclofenac /Indomethacin  and other Nonsteroidal anti-inflammatory medications as these will make you more likely to bleed and can cause stomach ulcers, can also cause Kidney problems.  7) outpatient follow-up with neurologist as advised 8)Obesity--Low calorie diet, portion control and increase physical activity discussed with patient -Body mass index is 34.48 kg/m.      If you do not have health insurance the following resources may be able to assist with primary care and medication assistance:  [facility]  [address]      [address]  [place], [place] [address]     [place], [place]  [address]  [phone]      [phone]  [email or web address]      [email or web address]

## 2024-02-06 NOTE — Progress Notes (Signed)
   02/06/24 2251  TOC Brief Assessment  Insurance and Status Lapsed (Resources for the uninsured added to AVS)  Patient has primary care physician Yes  Home environment has been reviewed From home  Prior level of function: Independent  Prior/Current Home Services No current home services  Social Drivers of Health Review SDOH reviewed interventions complete (smoking cessation added to AVS)  Readmission risk has been reviewed Yes  Transition of care needs no transition of care needs at this time   Transition of Care Department [facility]) has reviewed patient and no other TOC needs have been identified at this time. We will continue to monitor patient advancement through interdisciplinary progression rounds. If new patient needs arise, please place a TOC consult.

## 2024-02-06 NOTE — ED Notes (Signed)
 Dr. Annabell Key notified of [facility] CBG 69.

## 2024-02-06 NOTE — ED Provider Notes (Signed)
 [place] EMERGENCY DEPARTMENT AT [facility] Provider Note   CSN: [id number] Arrival date & time: 02/06/24  1320     History  Chief Complaint  Patient presents with   Numbness    Linda Lin is a 54 y.o. female.  HPI   This patient is a 53 year old female, she has a history of diabetes high cholesterol hypertension and she smokes cigarettes, she reports that last night at about 10:00 she noticed that the left side of her face went numb, this morning when she woke up her left arm and left leg were also numb.  She did feel like she was a little bit off balance at 1 point but in general has been able to ambulate without difficulty and denies any weakness of the arms or the legs, no facial droop or changes in speech or vision.  She has no prior history of cardiac or neurologic abnormalities that she is aware of and takes her regular medications consistently.  Again onset of symptoms was approximately 16 hours ago  Home Medications Prior to Admission medications   Medication Sig Start Date End Date Taking? Authorizing Provider  acetaminophen  (TYLENOL ) 500 MG tablet Take 500 mg by mouth every 6 (six) hours as needed for mild pain or moderate pain.     [provider]  albuterol  (PROVENTIL ) (2.5 MG/3ML) 0.083% nebulizer solution Take 3 mLs (2.5 mg total) by nebulization every 4 (four) hours as needed for wheezing or shortness of breath. 11/25/20 07/31/22  Gwendalyn Lemma, MD  albuterol  (VENTOLIN  HFA) 108 (90 Base) MCG/ACT inhaler INHALE 2 PUFFS BY MOUTH EVERY 6 HOURS AS NEEDED FOR WHEEZING OR SHORTNESS OF BREATH 06/04/22   Meldon Sport, MD  aspirin  81 MG chewable tablet Chew 1 tablet (81 mg total) by mouth daily. 06/10/20   Goodrich, Callie E, PA-C  celecoxib  (CELEBREX ) 100 MG capsule Take 1 capsule (100 mg total) by mouth 2 (two) times daily as needed for mild pain or moderate pain. 07/31/22   Meldon Sport, MD  diclofenac  (VOLTAREN ) 75 MG EC tablet Take 1 tablet by  mouth 2 (two) times daily. 09/10/22   [provider]  Fezolinetant  (VEOZAH ) 45 MG TABS Take 1 tablet (45 mg total) by mouth daily. 11/13/22   Meldon Sport, MD  fluticasone -salmeterol (ADVAIR) 100-50 MCG/ACT AEPB INHALE 1 DOSE BY MOUTH TWICE DAILY 09/22/23   Patel, Rutwik K, MD  ibuprofen  (ADVIL ) 200 MG tablet Take 200 mg by mouth every 6 (six) hours as needed for headache.    [provider]  levothyroxine  (SYNTHROID ) 100 MCG tablet TAKE 1 TABLET BY MOUTH ONCE DAILY BEFORE BREAKFAST 01/19/24   Meldon Sport, MD  lisinopril  (ZESTRIL ) 10 MG tablet Take 1 tablet (10 mg total) by mouth daily. 11/13/22 05/12/23  Meldon Sport, MD  lisinopril  (ZESTRIL ) 5 MG tablet Take 1 tablet by mouth once daily 01/21/23   Patel, Rutwik K, MD  metFORMIN  (GLUCOPHAGE ) 500 MG tablet TAKE 1 TABLET BY MOUTH TWICE DAILY WITH A MEAL 01/21/23   Patel, Rutwik K, MD  metoprolol  tartrate (LOPRESSOR ) 25 MG tablet Take 1 tablet by mouth twice daily 01/21/23   Patel, Rutwik K, MD  oxyCODONE  (OXY IR/ROXICODONE ) 5 MG immediate release tablet TAKE 1 TABLET BY MOUTH EVERY 4 TO 6 HOURS FOR 7 DAYS    [provider]  rosuvastatin  (CRESTOR ) 40 MG tablet Take 1 tablet (40 mg total) by mouth daily. 11/13/22   Meldon Sport, MD  Vitamin D ,  Ergocalciferol , (DRISDOL ) 1.25 MG (50000 UNIT) CAPS capsule Take 1 capsule (50,000 Units total) by mouth every 7 (seven) days. 01/01/22   Meldon Sport, MD      Allergies    Other, Cephalexin , and Doxycycline     Review of Systems   Review of Systems  All other systems reviewed and are negative.   Physical Exam Updated Vital Signs BP (!) 152/74   Pulse 64   Temp 98.7 F (37.1 C) (Oral)   Resp 16   Ht 1.575 m (5\' 2" )   Wt 81.6 kg   LMP 05/08/2020   SpO2 96%   BMI 32.92 kg/m  Physical Exam Vitals and nursing note reviewed.  Constitutional:      General: She is not in acute distress.    Appearance: She is well-developed.  HENT:     Head: Normocephalic and  atraumatic.     Mouth/Throat:     Pharynx: No oropharyngeal exudate.  Eyes:     General: No scleral icterus.       Right eye: No discharge.        Left eye: No discharge.     Conjunctiva/sclera: Conjunctivae normal.     Pupils: Pupils are equal, round, and reactive to light.  Neck:     Thyroid : No thyromegaly.     Vascular: No JVD.  Cardiovascular:     Rate and Rhythm: Normal rate and regular rhythm.     Heart sounds: Normal heart sounds. No murmur heard.    No friction rub. No gallop.  Pulmonary:     Effort: Pulmonary effort is normal. No respiratory distress.     Breath sounds: Normal breath sounds. No wheezing or rales.  Abdominal:     General: Bowel sounds are normal. There is no distension.     Palpations: Abdomen is soft. There is no mass.     Tenderness: There is no abdominal tenderness.  Musculoskeletal:        General: No tenderness. Normal range of motion.     Cervical back: Normal range of motion and neck supple.     Right lower leg: No edema.     Left lower leg: No edema.  Lymphadenopathy:     Cervical: No cervical adenopathy.  Skin:    General: Skin is warm and dry.     Findings: No erythema or rash.  Neurological:     Mental Status: She is alert.     Coordination: Coordination normal.     Comments: Decree sensation to the left face left arm and left leg, this is stocking glove, there is no weakness, normal finger-nose-finger normal strength in all 4 extremities no pronator drift, cranial nerves III through XII are otherwise normal including peripheral visual fields, just a sensory deficit.  Psychiatric:        Behavior: Behavior normal.     ED Results / Procedures / Treatments   Labs (all labs ordered are listed, but only abnormal results are displayed) Labs Reviewed  CBC - Abnormal; Notable for the following components:      Result Value   MCV 102.5 (*)    All other components within normal limits  COMPREHENSIVE METABOLIC PANEL WITH GFR - Abnormal;  Notable for the following components:   Creatinine, Ser 1.07 (*)    Total Protein 8.8 (*)    All other components within normal limits  I-STAT CHEM 8, ED - Abnormal; Notable for the following components:   Creatinine, Ser 1.10 (*)    Glucose,  Bld 69 (*)    Hemoglobin 16.3 (*)    HCT 48.0 (*)    All other components within normal limits  ETHANOL  PROTIME-INR  APTT  DIFFERENTIAL  RAPID URINE DRUG SCREEN, HOSP PERFORMED    EKG EKG Interpretation Date/Time:  Friday Feb 06 2024 15:22:42 EDT Ventricular Rate:  63 PR Interval:  140 QRS Duration:  92 QT Interval:  426 QTC Calculation: 437 R Axis:   77  Text Interpretation: Sinus rhythm Borderline T wave abnormalities Confirmed by Early Glisson ([id number]) on 02/06/2024 3:30:39 PM  Radiology MR BRAIN WO CONTRAST Result Date: 02/06/2024 CLINICAL DATA:  Provided history: Numbness or tingling, paresthesia. Additional history provided: Left-sided numbness, onset last night. EXAM: MRI HEAD WITHOUT CONTRAST TECHNIQUE: Multiplanar, multiecho pulse sequences of the brain and surrounding structures were obtained without intravenous contrast. COMPARISON:  Head CT performed earlier today 02/06/2024. Brain MRI 07/11/2017. FINDINGS: Brain: No age-advanced or lobar predominant cerebral atrophy. 5 mm acute infarct within the right thalamus (series 5, image 26). Chronic lacunar infarcts elsewhere within the bilateral thalami and left thalamocapsular junction. Chronic microhemorrhages also present within the thalami. Chronic lacunar infarcts within the right frontal lobe periventricular white matter and left corona radiata, new from the prior MRI. Background multifocal T2 FLAIR hyperintense signal abnormality within the cerebral white matter, overall moderate in severity but significantly greater than expected for age. Cavum septum pellucidum and cavum vergae (anatomic variant). Small chronic infarct within the right cerebellar hemisphere. No evidence of an  intracranial mass. No extra-axial fluid collection. No midline shift. Vascular: Maintained flow voids within the proximal large arterial vessels. Skull and upper cervical spine: No focal worrisome marrow lesion. Incompletely assessed cervical spondylosis. Sinuses/Orbits: No mass or acute finding within the imaged orbits. No significant paranasal sinus disease. Other: Trace fluid within left mastoid air cells. 8 mm Tornwaldt cyst. IMPRESSION: 1. 5 mm acute infarct within the right thalamus. 2. Chronic lacunar infarcts within the thalami, left thalamocapsular junction, right frontal lobe periventricular white matter and left corona radiata, new from the prior MRI of 07/11/2017. A small chronic infarct is also now present within the right cerebellar hemisphere. 3. Background cerebral white matter chronic small vessel ischemic disease, overall moderate in severity and significantly greater than expected for age. These findings have progressed. 4. Chronic microhemorrhages within the thalami, new from the prior MRI and likely reflecting sequelae of chronic hypertensive microangiopathy. 5. Trace left mastoid effusion. Electronically Signed   By: Bascom Lily D.O.   On: 02/06/2024 17:18   CT HEAD WO CONTRAST Result Date: 02/06/2024 CLINICAL DATA:  Provided history: Numbness or tingling, paresthesia. Left-sided facial, arm and leg numbness. EXAM: CT HEAD WITHOUT CONTRAST TECHNIQUE: Contiguous axial images were obtained from the base of the skull through the vertex without intravenous contrast. RADIATION DOSE REDUCTION: This exam was performed according to the departmental dose-optimization program which includes automated exposure control, adjustment of the mA and/or kV according to patient size and/or use of iterative reconstruction technique. COMPARISON:  Brain MRI 07/11/2017. FINDINGS: Brain: No age-advanced or lobar predominant cerebral atrophy. Cavum septum pellucidum and cavum vergae (anatomic variant). Lacunar  infarcts within the thalami bilaterally, new from the prior brain MRI of 07/11/2017 but otherwise age-indeterminate. Nonspecific chronic cerebral white matter and pontine disease is occult by CT and was better appreciated on the prior MRI. There is no acute intracranial hemorrhage. No demarcated cortical infarct. No extra-axial fluid collection. No evidence of an intracranial mass. No midline shift. Vascular: No hyperdense vessel.  Atherosclerotic  calcifications. Skull: No calvarial fracture or aggressive osseous lesion. Sinuses/Orbits: No mass or acute finding within the imaged orbits. No significant paranasal sinus disease at the imaged levels. IMPRESSION: 1. Lacunar infarcts within the bilateral thalami, new from the prior brain MRI of 07/11/2017 but otherwise age-indeterminate. Consider a brain MRI for further evaluation. 2. Nonspecific chronic cerebral white matter and pontine disease is occult by CT and was better appreciated on the prior MRI. Electronically Signed   By: Bascom Lily D.O.   On: 02/06/2024 14:41    Procedures Procedures    Medications Ordered in ED Medications  clopidogrel  (PLAVIX ) tablet 75 mg (has no administration in time range)  aspirin  chewable tablet 324 mg (324 mg Oral Given 02/06/24 1747)    ED Course/ Medical Decision Making/ A&P                                 Medical Decision Making Amount and/or Complexity of Data Reviewed Labs: ordered. Radiology: ordered.  Risk OTC drugs. Prescription drug management. Decision regarding hospitalization.    This patient presents to the ED for concern of numbness to left side, this involves an extensive number of treatment options, and is a complaint that carries with it a high risk of complications and morbidity.  The differential diagnosis includes stroke, electrolyte abnormalities, the patient thinks it might be related to taking ibuprofen  last night but she has no history of allergy to ibuprofen .   Co morbidities  that complicate the patient evaluation  Multiple car risk factors hypertension high cholesterol diabetes and tobacco use   Additional history obtained:  Additional history obtained from med record External records from outside source obtained and reviewed including multiple visits to the doctor, no recent admissions to the hospital, most recently admitted for preop testing prior to having orthopedic surgery a year and a half ago.  Otherwise no recent admissions   Lab Tests:  I Ordered, and personally interpreted labs.  The pertinent results include:     Imaging Studies ordered:  CT scan showed some chronic ischemic findings I ordered imaging studies including MRI of the brain I independently visualized and interpreted imaging which showed acute ischemic thalamic stroke I agree with the radiologist interpretation   Cardiac Monitoring: / EKG:  The patient was maintained on a cardiac monitor.  I personally viewed and interpreted the cardiac monitored which showed an underlying rhythm of: Normal sinus rhythm   Problem List / ED Course / Critical interventions / Medication management  The patient is had an acute ischemic stroke, no other signs of endorgan damage, hypertension is minimal I ordered medication including aspirin  and Plavix  for acute ischemic stroke Reevaluation of the patient after these medicines showed that the patient unchanged numbness, no progressive weakness I have reviewed the patients home medicines and have made adjustments as needed   Consultations Obtained:  I requested consultation with the neurologist Dr. Lindzen and the hospitalist Dr. Cathyann Cobia,  and discussed lab and imaging findings as well as pertinent plan - they recommend: Admission to the hospital for the rest of the workup including a transthoracic echocardiogram, Dr. Renaee Caro recommends progressive treatment into clopidogrel  as the patient has been taking aspirin  and had a stroke while taking this  medication   Social Determinants of Health:  None   Test / Admission - Considered:  Admit to hospital         Final Clinical Impression(s) / ED Diagnoses Final  diagnoses:  Acute ischemic stroke [facility])  Essential hypertension    Rx / DC Orders ED Discharge Orders     None         Early Glisson, MD 02/06/24 1758

## 2024-02-07 ENCOUNTER — Observation Stay (HOSPITAL_COMMUNITY): Payer: Self-pay

## 2024-02-07 DIAGNOSIS — I10 Essential (primary) hypertension: Secondary | ICD-10-CM

## 2024-02-07 DIAGNOSIS — I639 Cerebral infarction, unspecified: Secondary | ICD-10-CM

## 2024-02-07 DIAGNOSIS — I6389 Other cerebral infarction: Secondary | ICD-10-CM

## 2024-02-07 LAB — ECHOCARDIOGRAM COMPLETE
AR max vel: 2.25 cm2
AV Area VTI: 2.5 cm2
AV Area mean vel: 2.55 cm2
AV Mean grad: 3.4 mmHg
AV Peak grad: 7.9 mmHg
Ao pk vel: 1.41 m/s
Area-P 1/2: 2.55 cm2
Height: 62 in
MV VTI: 2.3 cm2
S' Lateral: 3 cm
Weight: 3016 [oz_av]

## 2024-02-07 LAB — GLUCOSE, CAPILLARY
Glucose-Capillary: 108 mg/dL — ABNORMAL HIGH (ref 70–99)
Glucose-Capillary: 104 mg/dL — ABNORMAL HIGH (ref 70–99)

## 2024-02-07 LAB — LIPID PANEL
Cholesterol: 211 mg/dL — ABNORMAL HIGH (ref 0–200)
HDL: 57 mg/dL (ref >40)
LDL Cholesterol: 141 mg/dL — ABNORMAL HIGH (ref 0–99)
Total CHOL/HDL Ratio: 3.7 ratio
Triglycerides: 65 mg/dL (ref <150)
VLDL: 13 mg/dL (ref 0–40)

## 2024-02-07 LAB — HIV ANTIBODY (ROUTINE TESTING W REFLEX): HIV Screen 4th Generation wRfx: NONREACTIVE

## 2024-02-07 LAB — HEMOGLOBIN A1C
Hgb A1c MFr Bld: 6.7 % — ABNORMAL HIGH (ref 4.8–5.6)
Mean Plasma Glucose: 145.59 mg/dL

## 2024-02-07 MED ORDER — LEVOTHYROXINE SODIUM 100 MCG PO TABS: 100.0000 ug | ORAL_TABLET | Freq: Every day | ORAL | 3 refills | Status: DC

## 2024-02-07 MED ORDER — NICOTINE 21 MG/24HR TD PT24
21.0000 mg | MEDICATED_PATCH | TRANSDERMAL | 0 refills | Status: AC
Start: 2024-02-07 — End: 2024-03-06

## 2024-02-07 MED ORDER — IOHEXOL 350 MG/ML SOLN
75.0000 mL | Freq: Once | INTRAVENOUS | Status: AC | PRN
  Administered 2024-02-07: 75 mL via INTRAVENOUS

## 2024-02-07 MED ORDER — LOSARTAN POTASSIUM-HCTZ 50-12.5 MG PO TABS: 1.0000 | ORAL_TABLET | Freq: Every day | ORAL | 11 refills | Status: DC

## 2024-02-07 MED ORDER — ACETAMINOPHEN 325 MG PO TABS: 650.0000 mg | ORAL_TABLET | ORAL | Status: AC | PRN

## 2024-02-07 MED ORDER — METOPROLOL SUCCINATE ER 50 MG PO TB24: 50.0000 mg | ORAL_TABLET | Freq: Every day | ORAL | 11 refills | Status: DC

## 2024-02-07 MED ORDER — ROSUVASTATIN CALCIUM 40 MG PO TABS: 40.0000 mg | ORAL_TABLET | Freq: Every day | ORAL | 3 refills | Status: DC

## 2024-02-07 MED ORDER — FLUTICASONE-SALMETEROL 100-50 MCG/ACT IN AEPB: 1.0000 | INHALATION_SPRAY | Freq: Two times a day (BID) | RESPIRATORY_TRACT | 4 refills | Status: DC

## 2024-02-07 MED ORDER — ALBUTEROL SULFATE (2.5 MG/3ML) 0.083% IN NEBU: 2.5000 mg | INHALATION_SOLUTION | RESPIRATORY_TRACT | 2 refills | Status: AC | PRN

## 2024-02-07 MED ORDER — CLOPIDOGREL BISULFATE 75 MG PO TABS: 75.0000 mg | ORAL_TABLET | Freq: Every day | ORAL | 11 refills | Status: DC

## 2024-02-07 MED ORDER — METFORMIN HCL 500 MG PO TABS: 500.0000 mg | ORAL_TABLET | Freq: Two times a day (BID) | ORAL | 3 refills | Status: DC

## 2024-02-07 MED ORDER — ALBUTEROL SULFATE HFA 108 (90 BASE) MCG/ACT IN AERS: 2.0000 | INHALATION_SPRAY | Freq: Four times a day (QID) | RESPIRATORY_TRACT | 2 refills | Status: DC | PRN

## 2024-02-07 MED ORDER — ASPIRIN 81 MG PO TBEC: 81.0000 mg | DELAYED_RELEASE_TABLET | Freq: Every day | ORAL | 0 refills | Status: AC

## 2024-02-07 NOTE — Discharge Summary (Signed)
 Linda Lin, is a 53 y.o. female  DOB 02/21/1971  MRN [id number].  Admission date:  02/06/2024  Admitting Physician  Malka Sea, DO  Discharge Date:  02/07/2024   Primary MD  Meldon Sport, MD  Recommendations for primary care physician for things to follow:   1)Take Aspirin  81 mg daily along with Plavix  75 mg daily for 21 days then after that STOP the Aspirin  and continue ONLY Plavix  75 mg daily indefinitely--for stroke prevention 2)Please take Losartan/HCTZ as prescribed in place of Lisinopril  blood pressure control 3)Metoprolol  Tartrate as been changed to metoprolol  succinate 50 mg once daily 4)Complete abstinence from tobacco strongly advised----you may use over-the-counter nicotine patch to help you quit smoking 5)Very Low-salt diet advised---Less than 2 gm of Sodium per day advised----ok to use Mrs DASH salt substitute instead of Salt 6)Avoid ibuprofen /Advil /Aleve /Motrin Juluis Ok Powders/Naproxen /BC powders/Meloxicam /Diclofenac /Indomethacin  and other Nonsteroidal anti-inflammatory medications as these will make you more likely to bleed and can cause stomach ulcers, can also cause Kidney problems.  7) outpatient follow-up with neurologist as advised  Admission Diagnosis  Acute ischemic stroke (HCC) [I63.9] Essential hypertension [I10] Ischemic stroke (HCC) [I63.9]   Discharge Diagnosis  Acute ischemic stroke (HCC) [I63.9] Essential hypertension [I10] Ischemic stroke (HCC) [I63.9]    Principal Problem:   Ischemic stroke (HCC) Active Problems:   Graves' disease   Hypothyroidism following radioiodine therapy   NSTEMI (non-ST elevated myocardial infarction) (HCC)   CAD (coronary artery disease)   Hyperlipidemia   Type 2 diabetes mellitus with complication, without long-term current use of insulin  [facility])   Essential hypertension      Past Medical History:  Diagnosis Date   Asthma     Cellulitis    Diabetes mellitus without complication (HCC)    Heart attack (HCC) 2021   Scabies    Thyroid  disorder     Past Surgical History:  Procedure Laterality Date   CARPOMETACARPEL SUSPENSION PLASTY Right 10/16/2022   Procedure: THUMB TRAPEZIECTOMY WITH SUSPENSION PLASTY;  Surgeon: Marilyn Shropshire, MD;  Location: [facility];  Service: Orthopedics;  Laterality: Right;   LEFT HEART CATH AND CORONARY ANGIOGRAPHY N/A 06/09/2020   Procedure: LEFT HEART CATH AND CORONARY ANGIOGRAPHY;  Surgeon: Wenona Hamilton, MD;  Location: [facility];  Service: Cardiovascular;  Laterality: N/A;     HPI  from the history and physical done on the day of admission:     HPI: Linda Lin is a 53 y.o. female with medical history significant of diabetes, hypertension, hypothyroidism, tobacco abuse.  Patient presents with left-sided weakness that started this morning and has continued throughout the day.  She has noted no motor weaknesses.  Denies headache, slurred speech, vision changes, fevers, chills, lightheadedness or dizziness.  She does not drink alcohol.  She has been smoking a little more frequently since she has lost her job.   Review of Systems: As mentioned in the history of present illness. All other systems reviewed and are negative.    Hospital Course:     1)Acute Stroke---  MRI brain with  5 mm acute infarct within the right thalamus. --As well as chronic lacunar infarcts within the thalami, left thalamocapsular junction, right frontal lobe periventricular white matter and left corona radiata, new from the prior MRI of 07/11/2017. A small chronic infarct is also now present within the right cerebellar hemisphere. - CT head and neck without LVO -A1c 6.7 -LDL 141, HDL 57, triglycerides 65T cholesterol 211---Crestor  as ordered - Echo with EF of 55 to 60%, moderate LVH, no regional wall motion normalities, grade 1 diastolic dysfunction noted,  no aortic stenosis,  no mitral stenosis -Take Aspirin  81 mg daily along with Plavix  75 mg daily for 21 days then after that STOP the Aspirin  and continue ONLY Plavix  75 mg daily indefinitely--for stroke  Prevention (Per The multicenter SAMMPRIS trial) -- Smoking cessation strongly advised  2)HTN--BP meds adjusted as follows take Losartan/HCTZ as prescribed in place of Lisinopril  blood pressure control, Metoprolol  Tartrate as been changed to metoprolol  succinate 50 mg once daily  3) tobacco abuse--complete abstinence advised use nicotine patch to help quit  4)Class 1 Obesity-- -Low calorie diet, portion control and increase physical activity discussed with patient -Body mass index is 34.48 kg/m.  5)COPD--- no acute exacerbation, continue bronchodilators -Smoking cessation advised as above   Discharge Condition: Stable,  Follow UP--outpatient neurologist follow-up as ordered  Diet and Activity recommendation:  As advised  Discharge Instructions    Discharge Instructions     Ambulatory referral to Neurology   Complete by: As directed    An appointment is requested in approximately: 8 weeks----stroke follow-up   Call MD for:  persistant dizziness or light-headedness   Complete by: As directed    Call MD for:  temperature >100.4   Complete by: As directed    Diet - low sodium heart healthy   Complete by: As directed    Diet Carb Modified   Complete by: As directed    Discharge instructions   Complete by: As directed    1)Take Aspirin  81 mg daily along with Plavix  75 mg daily for 21 days then after that STOP the Aspirin  and continue ONLY Plavix  75 mg daily indefinitely--for stroke prevention 2)Please take Losartan/HCTZ as prescribed in place of Lisinopril  blood pressure control 3)Metoprolol  Tartrate as been changed to metoprolol  succinate 50 mg once daily 4)Complete abstinence from tobacco strongly advised----you may use over-the-counter nicotine patch to help you quit smoking 5)Very Low-salt diet  advised---Less than 2 gm of Sodium per day advised----ok to use Mrs DASH salt substitute instead of Salt 6)Avoid ibuprofen /Advil /Aleve /Motrin Juluis Ok Powders/Naproxen /BC powders/Meloxicam /Diclofenac /Indomethacin  and other Nonsteroidal anti-inflammatory medications as these will make you more likely to bleed and can cause stomach ulcers, can also cause Kidney problems.  7) outpatient follow-up with neurologist as advised 8)Obesity--Low calorie diet, portion control and increase physical activity discussed with patient -Body mass index is 34.48 kg/m.   Increase activity slowly   Complete by: As directed        Discharge Medications     Allergies as of 02/07/2024       Reactions   Other Hives   shellfish   Cephalexin  Rash   Doxycycline  Rash        Medication List     STOP taking these medications    aspirin  81 MG chewable tablet Replaced by: aspirin  EC 81 MG tablet   ibuprofen  200 MG tablet Commonly known as: ADVIL    lisinopril  10 MG tablet Commonly known as: ZESTRIL    lisinopril  5 MG tablet Commonly known as:  ZESTRIL    metoprolol  tartrate 25 MG tablet Commonly known as: LOPRESSOR        TAKE these medications    acetaminophen  325 MG tablet Commonly known as: TYLENOL  Take 2 tablets (650 mg total) by mouth every 4 (four) hours as needed for mild pain (pain score 1-3) (or temp > 37.5 C (99.5 F)). What changed:  medication strength how much to take when to take this reasons to take this   albuterol  (2.5 MG/3ML) 0.083% nebulizer solution Commonly known as: PROVENTIL  Take 3 mLs (2.5 mg total) by nebulization every 4 (four) hours as needed for wheezing or shortness of breath. What changed: Another medication with the same name was changed. Make sure you understand how and when to take each.   albuterol  108 (90 Base) MCG/ACT inhaler Commonly known as: VENTOLIN  HFA Inhale 2 puffs into the lungs every 6 (six) hours as needed for wheezing or shortness of breath. What  changed: See the new instructions.   aspirin  EC 81 MG tablet Take 1 tablet (81 mg total) by mouth daily with breakfast. take Aspirin  81 mg daily along with Plavix  75 mg daily for 21 days then after that STOP the Aspirin  and continue ONLY Plavix  75 mg daily indefinitely- Replaces: aspirin  81 MG chewable tablet   clopidogrel  75 MG tablet Commonly known as: Plavix  Take 1 tablet (75 mg total) by mouth daily. take Aspirin  81 mg daily along with Plavix  75 mg daily for 21 days then after that STOP the Aspirin  and continue ONLY Plavix  75 mg daily indefinitely--   fluticasone -salmeterol 100-50 MCG/ACT Aepb Commonly known as: ADVAIR Inhale 1 puff into the lungs 2 (two) times daily. What changed: See the new instructions.   levothyroxine  100 MCG tablet Commonly known as: SYNTHROID  Take 1 tablet (100 mcg total) by mouth daily before breakfast.   losartan-hydrochlorothiazide 50-12.5 MG tablet Commonly known as: Hyzaar Take 1 tablet by mouth daily.   metFORMIN  500 MG tablet Commonly known as: GLUCOPHAGE  Take 1 tablet (500 mg total) by mouth 2 (two) times daily with a meal.   metoprolol  succinate 50 MG 24 hr tablet Commonly known as: Toprol  XL Take 1 tablet (50 mg total) by mouth daily. Take with or immediately following a meal.   nicotine 21 mg/24hr patch Commonly known as: NICODERM CQ - dosed in mg/24 hours Place 1 patch (21 mg total) onto the skin daily for 28 days.   oxyCODONE  5 MG immediate release tablet Commonly known as: Oxy IR/ROXICODONE  TAKE 1 TABLET BY MOUTH EVERY 4 TO 6 HOURS FOR 7 DAYS   rosuvastatin  40 MG tablet Commonly known as: CRESTOR  Take 1 tablet (40 mg total) by mouth daily.   Vitamin D  (Ergocalciferol ) 1.25 MG (50000 UNIT) Caps capsule Commonly known as: DRISDOL  Take 1 capsule (50,000 Units total) by mouth every 7 (seven) days.       Major procedures and Radiology Reports - PLEASE review detailed and final reports for all details, in brief -   CT ANGIO HEAD  NECK W WO CM Result Date: 02/07/2024 CLINICAL DATA:  53 year old female with neurologic deficit. Right thalamic subcentimeter lacunar infarct. EXAM: CT ANGIOGRAPHY HEAD AND NECK WITH AND WITHOUT CONTRAST TECHNIQUE: Multidetector CT imaging of the head and neck was performed using the standard protocol during bolus administration of intravenous contrast. Multiplanar CT image reconstructions and MIPs were obtained to evaluate the vascular anatomy. Carotid stenosis measurements (when applicable) are obtained utilizing NASCET criteria, using the distal internal carotid diameter as the denominator. RADIATION DOSE REDUCTION:  This exam was performed according to the departmental dose-optimization program which includes automated exposure control, adjustment of the mA and/or kV according to patient size and/or use of iterative reconstruction technique. CONTRAST:  75mL OMNIPAQUE  IOHEXOL  350 MG/ML SOLN COMPARISON:  Brain MRI yesterday.  Head CT yesterday. FINDINGS: CT HEAD Brain: Multifocal bilateral deep gray nuclei lacunar infarcts, especially in the bilateral thalami. The acute lesion is subtle by CT with no hemorrhage or mass effect. Stable gray-white matter differentiation throughout the brain. No midline shift, ventriculomegaly, mass effect, evidence of mass lesion, intracranial hemorrhage or evidence of cortically based acute infarction. Calvarium and skull base: Intact, negative. Paranasal sinuses: Visualized paranasal sinuses and mastoids are clear. Orbits: Visualized orbits and scalp soft tissues are within normal limits. CTA NECK Skeleton: Mild for age cervical spine degeneration. No acute or suspicious osseous lesion. Upper chest: Central pulmonary artery enlargement (series 6, image 171) but otherwise negative. Other neck: Diminutive or absent thyroid . Nonvascular neck soft tissue spaces are otherwise within normal limits. Aortic arch: 3 vessel arch.  Mild Calcified aortic atherosclerosis. Right carotid system:  Minimal atherosclerosis, most pronounced at the lateral right ICA origin. Tortuous right ICA just below the skull base. No stenosis. Left carotid system: Tortuosity with minimal atherosclerosis and no stenosis. Highly tortuous left ICA just distal to the bulb. Vertebral arteries: Proximal right subclavian artery and mildly dominant cervical right vertebral arteries are normal aside from tortuosity. Proximal left subclavian artery and cervical left vertebral artery are normal aside from tortuosity. Non dominant left vertebral. CTA HEAD Posterior circulation: Distal vertebral arteries, vertebrobasilar junction, PICA origins, basilar artery, SCA, PCA origins are patent and normal. Posterior communicating arteries are diminutive or absent. Bilateral PCA branches are within normal limits, no significant irregularity. Anterior circulation: Both ICA siphons are patent. On the left there is moderate calcified plaque in the cavernous segment with mild to moderate stenosis there. On the right there is mild calcified plaque without stenosis. Patent carotid termini. Normal MCA and ACA origins. Diminutive or absent anterior communicating artery. Bilateral ACA branches are within normal limits. Left MCA M1 segment and trifurcation are patent without stenosis. Right MCA M1 segment and trifurcation are patent without stenosis. Bilateral MCA branches are within normal limits. Venous sinuses: Patent. Anatomic variants: Dominant right vertebral artery. Review of the MIP images confirms the above findings IMPRESSION: 1. No large vessel occlusion. Minimal extracranial atherosclerosis with tortuous cervical ICAs and vertebral arteries. Left > right ICA siphon calcified plaque with mild to moderate left cavernous ICA stenosis. Otherwise negative anterior and posterior circulation. 2. Expected CT appearance of acute on chronic small vessel disease since the MRI yesterday. No new intracranial abnormality. 3. Partially visible central  pulmonary artery enlargement, raising the possibility of Pulmonary Artery Hypertension. Aortic Atherosclerosis (ICD10-I70.0). Electronically Signed   By: Marlise Simpers M.D.   On: 02/07/2024 09:39   ECHOCARDIOGRAM COMPLETE Result Date: 02/07/2024    ECHOCARDIOGRAM REPORT   Patient Name:   Linda Lin Date of Exam: 02/06/2024 Medical Rec #:  [id number]          Height:       62.0 in Accession #:    [id number]         Weight:       188.5 lb Date of Birth:  1971-01-13          BSA:          1.864 m Patient Age:    68 years  BP:           135/77 mmHg Patient Gender: F                  HR:           83 bpm. Exam Location:  [facility] Procedure: 2D Echo, Cardiac Doppler and Color Doppler (Both Spectral and Color            Flow Doppler were utilized during procedure). Indications:    Stroke  History:        Patient has prior history of Echocardiogram examinations.                 Previous Myocardial Infarction; Risk Factors:Diabetes and                 Current Smoker.  Sonographer:    Willey Harrier Referring Phys: [phone] JACOB J STINSON IMPRESSIONS  1. Left ventricular ejection fraction, by estimation, is 55 to 60%. The left ventricle has normal function. The left ventricle has no regional wall motion abnormalities. There is moderate left ventricular hypertrophy. Left ventricular diastolic parameters are consistent with Grade I diastolic dysfunction (impaired relaxation).  2. Right ventricular systolic function is normal. The right ventricular size is normal. Tricuspid regurgitation signal is inadequate for assessing PA pressure.  3. A small pericardial effusion is present. The pericardial effusion is circumferential.  4. The mitral valve is normal in structure. No evidence of mitral valve regurgitation. No evidence of mitral stenosis.  5. The aortic valve is tricuspid. Aortic valve regurgitation is not visualized. No aortic stenosis is present. FINDINGS  Left Ventricle: Left ventricular ejection fraction, by  estimation, is 55 to 60%. The left ventricle has normal function. The left ventricle has no regional wall motion abnormalities. The left ventricular internal cavity size was normal in size. There is  moderate left ventricular hypertrophy. Left ventricular diastolic parameters are consistent with Grade I diastolic dysfunction (impaired relaxation). Normal left ventricular filling pressure. Right Ventricle: The right ventricular size is normal. Right vetricular wall thickness was not well visualized. Right ventricular systolic function is normal. Tricuspid regurgitation signal is inadequate for assessing PA pressure. Left Atrium: Left atrial size was normal in size. Right Atrium: Right atrial size was normal in size. Pericardium: A small pericardial effusion is present. The pericardial effusion is circumferential. Mitral Valve: The mitral valve is normal in structure. No evidence of mitral valve regurgitation. No evidence of mitral valve stenosis. MV peak gradient, 2.8 mmHg. The mean mitral valve gradient is 1.0 mmHg. Tricuspid Valve: The tricuspid valve is normal in structure. Tricuspid valve regurgitation is trivial. No evidence of tricuspid stenosis. Aortic Valve: The aortic valve is tricuspid. Aortic valve regurgitation is not visualized. No aortic stenosis is present. Aortic valve mean gradient measures 3.4 mmHg. Aortic valve peak gradient measures 7.9 mmHg. Aortic valve area, by VTI measures 2.50 cm. Pulmonic Valve: The pulmonic valve was not well visualized. Pulmonic valve regurgitation is not visualized. No evidence of pulmonic stenosis. Aorta: The aortic root and ascending aorta are structurally normal, with no evidence of dilitation. Venous: IVC is small suggesting low RA pressure and hypovolemia. IAS/Shunts: No atrial level shunt detected by color flow Doppler.  LEFT VENTRICLE PLAX 2D LVIDd:         4.50 cm   Diastology LVIDs:         3.00 cm   LV e' medial:    4.68 cm/s LV PW:         1.40  cm   LV E/e'  medial:  15.9 LV IVS:        1.40 cm   LV e' lateral:   5.66 cm/s LVOT diam:     2.00 cm   LV E/e' lateral: 13.2 LV SV:         61 LV SV Index:   33 LVOT Area:     3.14 cm  RIGHT VENTRICLE             IVC RV Basal diam:  3.10 cm     IVC diam: 1.30 cm RV S prime:     11.00 cm/s TAPSE (M-mode): 1.8 cm LEFT ATRIUM             Index        RIGHT ATRIUM           Index LA Vol (A2C):   43.7 ml 23.44 ml/m  RA Area:     13.70 cm LA Vol (A4C):   36.6 ml 19.63 ml/m  RA Volume:   38.10 ml  20.44 ml/m LA Biplane Vol: 42.0 ml 22.53 ml/m  AORTIC VALVE AV Area (Vmax):    2.25 cm AV Area (Vmean):   2.55 cm AV Area (VTI):     2.50 cm AV Vmax:           140.92 cm/s AV Vmean:          85.839 cm/s AV VTI:            0.245 m AV Peak Grad:      7.9 mmHg AV Mean Grad:      3.4 mmHg LVOT Vmax:         101.00 cm/s LVOT Vmean:        69.600 cm/s LVOT VTI:          0.195 m LVOT/AV VTI ratio: 0.80  AORTA Ao Root diam: 3.00 cm Ao Asc diam:  3.00 cm MITRAL VALVE MV Area (PHT): 2.55 cm    SHUNTS MV Area VTI:   2.30 cm    Systemic VTI:  0.20 m MV Peak grad:  2.8 mmHg    Systemic Diam: 2.00 cm MV Mean grad:  1.0 mmHg MV Vmax:       0.84 m/s MV Vmean:      55.8 cm/s MV Decel Time: 297 msec MV E velocity: 74.60 cm/s MV A velocity: 93.00 cm/s MV E/A ratio:  0.80 Armida Lander MD Electronically signed by Armida Lander MD Signature Date/Time: 02/07/2024/9:13:15 AM    Final    MR BRAIN WO CONTRAST Result Date: 02/06/2024 CLINICAL DATA:  Provided history: Numbness or tingling, paresthesia. Additional history provided: Left-sided numbness, onset last night. EXAM: MRI HEAD WITHOUT CONTRAST TECHNIQUE: Multiplanar, multiecho pulse sequences of the brain and surrounding structures were obtained without intravenous contrast. COMPARISON:  Head CT performed earlier today 02/06/2024. Brain MRI 07/11/2017. FINDINGS: Brain: No age-advanced or lobar predominant cerebral atrophy. 5 mm acute infarct within the right thalamus (series 5, image 26).  Chronic lacunar infarcts elsewhere within the bilateral thalami and left thalamocapsular junction. Chronic microhemorrhages also present within the thalami. Chronic lacunar infarcts within the right frontal lobe periventricular white matter and left corona radiata, new from the prior MRI. Background multifocal T2 FLAIR hyperintense signal abnormality within the cerebral white matter, overall moderate in severity but significantly greater than expected for age. Cavum septum pellucidum and cavum vergae (anatomic variant). Small chronic infarct within the right cerebellar hemisphere. No evidence of an intracranial mass. No extra-axial  fluid collection. No midline shift. Vascular: Maintained flow voids within the proximal large arterial vessels. Skull and upper cervical spine: No focal worrisome marrow lesion. Incompletely assessed cervical spondylosis. Sinuses/Orbits: No mass or acute finding within the imaged orbits. No significant paranasal sinus disease. Other: Trace fluid within left mastoid air cells. 8 mm Tornwaldt cyst. IMPRESSION: 1. 5 mm acute infarct within the right thalamus. 2. Chronic lacunar infarcts within the thalami, left thalamocapsular junction, right frontal lobe periventricular white matter and left corona radiata, new from the prior MRI of 07/11/2017. A small chronic infarct is also now present within the right cerebellar hemisphere. 3. Background cerebral white matter chronic small vessel ischemic disease, overall moderate in severity and significantly greater than expected for age. These findings have progressed. 4. Chronic microhemorrhages within the thalami, new from the prior MRI and likely reflecting sequelae of chronic hypertensive microangiopathy. 5. Trace left mastoid effusion. Electronically Signed   By: Bascom Lily D.O.   On: 02/06/2024 17:18   CT HEAD WO CONTRAST Result Date: 02/06/2024 CLINICAL DATA:  Provided history: Numbness or tingling, paresthesia. Left-sided facial, arm and  leg numbness. EXAM: CT HEAD WITHOUT CONTRAST TECHNIQUE: Contiguous axial images were obtained from the base of the skull through the vertex without intravenous contrast. RADIATION DOSE REDUCTION: This exam was performed according to the departmental dose-optimization program which includes automated exposure control, adjustment of the mA and/or kV according to patient size and/or use of iterative reconstruction technique. COMPARISON:  Brain MRI 07/11/2017. FINDINGS: Brain: No age-advanced or lobar predominant cerebral atrophy. Cavum septum pellucidum and cavum vergae (anatomic variant). Lacunar infarcts within the thalami bilaterally, new from the prior brain MRI of 07/11/2017 but otherwise age-indeterminate. Nonspecific chronic cerebral white matter and pontine disease is occult by CT and was better appreciated on the prior MRI. There is no acute intracranial hemorrhage. No demarcated cortical infarct. No extra-axial fluid collection. No evidence of an intracranial mass. No midline shift. Vascular: No hyperdense vessel.  Atherosclerotic calcifications. Skull: No calvarial fracture or aggressive osseous lesion. Sinuses/Orbits: No mass or acute finding within the imaged orbits. No significant paranasal sinus disease at the imaged levels. IMPRESSION: 1. Lacunar infarcts within the bilateral thalami, new from the prior brain MRI of 07/11/2017 but otherwise age-indeterminate. Consider a brain MRI for further evaluation. 2. Nonspecific chronic cerebral white matter and pontine disease is occult by CT and was better appreciated on the prior MRI. Electronically Signed   By: Bascom Lily D.O.   On: 02/06/2024 14:41   Today   Subjective    Linda Lin today has no new complaints No fever  Or chills   No Nausea, Vomiting or Diarrhea  - Left upper extremity numbness improving         Patient has been seen and examined prior to discharge   Objective   Blood pressure (!) 153/81, pulse 60, temperature 98 F  (36.7 C), temperature source Oral, resp. rate 17, height 5\' 2"  (1.575 m), weight 85.5 kg, last menstrual period 05/08/2020, SpO2 100%.   Intake/Output Summary (Last 24 hours) at 02/07/2024 1231 Last data filed at 02/07/2024 0948 Gross per 24 hour  Intake 480 ml  Output --  Net 480 ml    Exam Gen:- Awake Alert, no acute distress  HEENT:- [place].AT, No sclera icterus Neck-Supple Neck,No JVD,.  Lungs-  CTAB , good air movement bilaterally CV- S1, S2 normal, regular Abd-  +ve B.Sounds, Abd Soft, No tenderness,    Extremity/Skin:- No  edema,   good pulses  Psych-affect is appropriate, oriented x3 Neuro-improving left upper extremity numbness, no additional new focal deficits, no tremors    Data Review   CBC w Diff:  Lab Results  Component Value Date   WBC 6.9 02/06/2024   HGB 16.3 (H) 02/06/2024   HGB 14.4 11/13/2022   HCT 48.0 (H) 02/06/2024   HCT 42.5 11/13/2022   PLT 274 02/06/2024   PLT 223 11/13/2022   LYMPHOPCT 44 02/06/2024   BANDSPCT 0 08/27/2015   MONOPCT 8 02/06/2024   EOSPCT 3 02/06/2024   BASOPCT 1 02/06/2024    CMP:  Lab Results  Component Value Date   NA 141 02/06/2024   NA 140 11/13/2022   K 3.9 02/06/2024   CL 102 02/06/2024   CO2 25 02/06/2024   BUN 18 02/06/2024   BUN 13 11/13/2022   CREATININE 1.10 (H) 02/06/2024   CREATININE 0.33 (L) 06/26/2017   PROT 8.8 (H) 02/06/2024   PROT 7.2 11/13/2022   ALBUMIN 4.1 02/06/2024   ALBUMIN 4.1 11/13/2022   BILITOT 0.9 02/06/2024   BILITOT 0.3 11/13/2022   ALKPHOS 115 02/06/2024   AST 19 02/06/2024   ALT 15 02/06/2024  .  Total Discharge time is about 33 minutes  Colin Dawley M.D on 02/07/2024 at 12:31 PM  Go to [email or web address] -  for contact info  [facility] - Office  [phone]

## 2024-02-07 NOTE — Plan of Care (Signed)

## 2024-02-07 NOTE — Evaluation (Addendum)
 Physical Therapy Evaluation Patient Details Name: Linda Lin MRN: [id number] DOB: 18-Jul-1971 Today's Date: 02/07/2024  History of Present Illness  Linda Lin is a 53 y.o. female with medical history significant of diabetes, hypertension, hypothyroidism, tobacco abuse.  Patient presents with left-sided weakness that started this morning and has continued throughout the day.  She has noted no motor weaknesses.  Denies headache, slurred speech, vision changes, fevers, chills, lightheadedness or dizziness.  She does not drink alcohol.  She has been smoking a little more frequently since she has lost her job.   Clinical Impression  Patient demonstrates decreased sensation to left side of face and weakness only in LUE in wrist compared to the right side. Pt reports she has had wrist problems in the past and does not really feel left side is that much weaker than the right. Pt does not demonstrate any signs or symptoms of pain and is independent with bed mobility, functional transfers and ambulation. Pt does demonstrate abnormality of left eye during convergence test of eye exam but is corrected with retesting with pts corrective lenses. Patient also demonstrates no unsteadiness on feet during ambulation and does not demonstrate need for any AD at this time. Patient discharged from physical therapy to care of nursing for ambulation daily as tolerated for length of stay.          If plan is discharge home, recommend the following: Help with stairs or ramp for entrance   Can travel by private vehicle        Equipment Recommendations None recommended by PT  Recommendations for Other Services       Functional Status Assessment Patient has had a recent decline in their functional status and demonstrates the ability to make significant improvements in function in a reasonable and predictable amount of time.     Precautions / Restrictions Precautions Precautions: None Recall of  Precautions/Restrictions: Intact Restrictions Weight Bearing Restrictions Per Provider Order: No      Mobility  Bed Mobility Overal bed mobility: Independent                  Transfers Overall transfer level: Independent                      Ambulation/Gait Ambulation/Gait assistance: Independent Gait Distance (Feet): 20 Feet Assistive device: None Gait Pattern/deviations: WFL(Within Functional Limits) Gait velocity: Normal     General Gait Details: Pt walking in room independently upon entering room. No unsteadiness in gait pattern observed.  Stairs            Wheelchair Mobility     Tilt Bed    Modified Rankin (Stroke Patients Only)       Balance Overall balance assessment: Independent                                           Pertinent Vitals/Pain Pain Assessment Pain Assessment: No/denies pain    Home Living Family/patient expects to be discharged to:: Private residence Living Arrangements: Other relatives Available Help at Discharge: Family Type of Home: House Home Access: Stairs to enter Entrance Stairs-Rails: Right Entrance Stairs-Number of Steps: 4   Home Layout: One level Home Equipment: None      Prior Function Prior Level of Function : Independent/Modified Independent             Mobility Comments: community ambulator  ADLs Comments: independent     Extremity/Trunk Assessment   Upper Extremity Assessment Upper Extremity Assessment: Overall WFL for tasks assessed    Lower Extremity Assessment Lower Extremity Assessment: Overall WFL for tasks assessed    Cervical / Trunk Assessment Cervical / Trunk Assessment: Normal  Communication   Communication Communication: No apparent difficulties    Cognition Arousal: Alert Behavior During Therapy: WFL for tasks assessed/performed   PT - Cognitive impairments: No apparent impairments                         Following commands:  Intact       Cueing Cueing Techniques: Verbal cues, Visual cues     General Comments      Exercises     Assessment/Plan    PT Assessment Patient does not need any further PT services  PT Problem List         PT Treatment Interventions      PT Goals (Current goals can be found in the Care Plan section)  Acute Rehab PT Goals Patient Stated Goal: to return home PT Goal Formulation: With patient Time For Goal Achievement: 02/07/24 Potential to Achieve Goals: Good    Frequency       Co-evaluation               AM-PAC PT "6 Clicks" Mobility  Outcome Measure Help needed turning from your back to your side while in a flat bed without using bedrails?: None Help needed moving from lying on your back to sitting on the side of a flat bed without using bedrails?: None Help needed moving to and from a bed to a chair (including a wheelchair)?: None Help needed standing up from a chair using your arms (e.g., wheelchair or bedside chair)?: None Help needed to walk in hospital room?: None Help needed climbing 3-5 steps with a railing? : A Little 6 Click Score: 23    End of Session   Activity Tolerance: Patient tolerated treatment well Patient left: in bed;with call bell/phone within reach Nurse Communication: Mobility status PT Visit Diagnosis: Other (comment) (impaired sensation on left side of face)    Time: 3086-5784 PT Time Calculation (min) (ACUTE ONLY): 11 min   Charges:   PT Evaluation $PT Eval Low Complexity: 1 Low PT Treatments $Therapeutic Activity: 8-22 mins PT General Charges $$ ACUTE PT VISIT: 1 Visit         Armond Bertin, PT, DPT [facility] Office: [phone] 9:59 AM, 02/07/24

## 2024-05-28 ENCOUNTER — Encounter (HOSPITAL_COMMUNITY): Payer: Self-pay

## 2024-05-28 ENCOUNTER — Other Ambulatory Visit: Payer: Self-pay

## 2024-05-28 ENCOUNTER — Emergency Department (HOSPITAL_COMMUNITY): Payer: Self-pay

## 2024-05-28 ENCOUNTER — Observation Stay (HOSPITAL_COMMUNITY)
Admission: EM | Admit: 2024-05-28 | Discharge: 2024-05-29 | Disposition: A | Discharge status: Home / Self Care | Payer: Self-pay | Attending: Family Medicine | Admitting: Family Medicine

## 2024-05-28 DIAGNOSIS — I2510 Atherosclerotic heart disease of native coronary artery without angina pectoris: Secondary | ICD-10-CM | POA: Diagnosis present

## 2024-05-28 DIAGNOSIS — E890 Postprocedural hypothyroidism: Secondary | ICD-10-CM | POA: Diagnosis present

## 2024-05-28 DIAGNOSIS — F12920 Cannabis use, unspecified with intoxication, uncomplicated: Secondary | ICD-10-CM | POA: Insufficient documentation

## 2024-05-28 DIAGNOSIS — I10 Essential (primary) hypertension: Secondary | ICD-10-CM | POA: Diagnosis not present

## 2024-05-28 DIAGNOSIS — I700 Atherosclerosis of aorta: Secondary | ICD-10-CM | POA: Diagnosis not present

## 2024-05-28 DIAGNOSIS — I6930 Unspecified sequelae of cerebral infarction: Secondary | ICD-10-CM

## 2024-05-28 DIAGNOSIS — I6389 Other cerebral infarction: Secondary | ICD-10-CM | POA: Diagnosis not present

## 2024-05-28 DIAGNOSIS — Z79899 Other long term (current) drug therapy: Secondary | ICD-10-CM | POA: Diagnosis not present

## 2024-05-28 DIAGNOSIS — I639 Cerebral infarction, unspecified: Principal | ICD-10-CM

## 2024-05-28 DIAGNOSIS — R4182 Altered mental status, unspecified: Secondary | ICD-10-CM | POA: Diagnosis present

## 2024-05-28 DIAGNOSIS — F10920 Alcohol use, unspecified with intoxication, uncomplicated: Secondary | ICD-10-CM | POA: Insufficient documentation

## 2024-05-28 DIAGNOSIS — E039 Hypothyroidism, unspecified: Secondary | ICD-10-CM | POA: Diagnosis not present

## 2024-05-28 DIAGNOSIS — J439 Emphysema, unspecified: Secondary | ICD-10-CM | POA: Insufficient documentation

## 2024-05-28 DIAGNOSIS — R29702 NIHSS score 2: Secondary | ICD-10-CM | POA: Diagnosis not present

## 2024-05-28 DIAGNOSIS — E119 Type 2 diabetes mellitus without complications: Secondary | ICD-10-CM | POA: Insufficient documentation

## 2024-05-28 DIAGNOSIS — Z7982 Long term (current) use of aspirin: Secondary | ICD-10-CM | POA: Diagnosis not present

## 2024-05-28 LAB — URINALYSIS, ROUTINE W REFLEX MICROSCOPIC
Bilirubin Urine: NEGATIVE
Glucose, UA: NEGATIVE mg/dL
Ketones, ur: NEGATIVE mg/dL
Leukocytes,Ua: NEGATIVE
Nitrite: POSITIVE — AB
Protein, ur: NEGATIVE mg/dL
Specific Gravity, Urine: 1.04 — ABNORMAL HIGH (ref 1.005–1.030)
pH: 7 (ref 5.0–8.0)

## 2024-05-28 LAB — CBC
HCT: 41.8 % (ref 36.0–46.0)
Hemoglobin: 13.9 g/dL (ref 12.0–15.0)
MCH: 33.3 pg (ref 26.0–34.0)
MCHC: 33.3 g/dL (ref 30.0–36.0)
MCV: 100.2 fL — ABNORMAL HIGH (ref 80.0–100.0)
Platelets: 266 K/uL (ref 150–400)
RBC: 4.17 MIL/uL (ref 3.87–5.11)
RDW: 12.8 % (ref 11.5–15.5)
WBC: 7.1 K/uL (ref 4.0–10.5)
nRBC: 0 % (ref 0.0–0.2)

## 2024-05-28 LAB — COMPREHENSIVE METABOLIC PANEL WITH GFR
ALT: 15 U/L (ref 0–44)
AST: 18 U/L (ref 15–41)
Albumin: 3.5 g/dL (ref 3.5–5.0)
Alkaline Phosphatase: 92 U/L (ref 38–126)
Anion gap: 10 (ref 5–15)
BUN: 13 mg/dL (ref 6–20)
CO2: 25 mmol/L (ref 22–32)
Calcium: 9.7 mg/dL (ref 8.9–10.3)
Chloride: 102 mmol/L (ref 98–111)
Creatinine, Ser: 0.9 mg/dL (ref 0.44–1.00)
GFR, Estimated: >60 mL/min (ref >60)
Glucose, Bld: 98 mg/dL (ref 70–99)
Potassium: 3.9 mmol/L (ref 3.5–5.1)
Sodium: 137 mmol/L (ref 135–145)
Total Bilirubin: 0.5 mg/dL (ref 0.0–1.2)
Total Protein: 7.7 g/dL (ref 6.5–8.1)

## 2024-05-28 LAB — DIFFERENTIAL
Abs Immature Granulocytes: 0.01 K/uL (ref 0.00–0.07)
Basophils Absolute: 0 K/uL (ref 0.0–0.1)
Basophils Relative: 0 %
Eosinophils Absolute: 0.2 K/uL (ref 0.0–0.5)
Eosinophils Relative: 2 %
Immature Granulocytes: 0 %
Lymphocytes Relative: 45 %
Lymphs Abs: 3.2 K/uL (ref 0.7–4.0)
Monocytes Absolute: 0.5 K/uL (ref 0.1–1.0)
Monocytes Relative: 8 %
Neutro Abs: 3.2 K/uL (ref 1.7–7.7)
Neutrophils Relative %: 45 %

## 2024-05-28 LAB — RAPID URINE DRUG SCREEN, HOSP PERFORMED
Amphetamines: NOT DETECTED
Barbiturates: NOT DETECTED
Benzodiazepines: NOT DETECTED
Cocaine: NOT DETECTED
Opiates: NOT DETECTED
Tetrahydrocannabinol: NOT DETECTED

## 2024-05-28 LAB — PROTIME-INR
INR: 0.9 (ref 0.8–1.2)
Prothrombin Time: 12.9 s (ref 11.4–15.2)

## 2024-05-28 LAB — APTT: aPTT: 27 s (ref 24–36)

## 2024-05-28 LAB — ETHANOL: Alcohol, Ethyl (B): <15 mg/dL (ref <15)

## 2024-05-28 LAB — CBG MONITORING, ED: Glucose-Capillary: 96 mg/dL (ref 70–99)

## 2024-05-28 MED ORDER — ROSUVASTATIN CALCIUM 20 MG PO TABS
40.0000 mg | ORAL_TABLET | Freq: Every day | ORAL | Status: DC
  Administered 2024-05-28 – 2024-05-29 (×2): 40 mg via ORAL
  Filled 2024-05-28 (×2): qty 2

## 2024-05-28 MED ORDER — ACETAMINOPHEN 650 MG RE SUPP: 650.0000 mg | RECTAL | Status: DC | PRN

## 2024-05-28 MED ORDER — METFORMIN HCL 500 MG PO TABS: 500.0000 mg | ORAL_TABLET | Freq: Two times a day (BID) | ORAL | Status: DC

## 2024-05-28 MED ORDER — ACETAMINOPHEN 160 MG/5ML PO SOLN: 650.0000 mg | ORAL | Status: DC | PRN

## 2024-05-28 MED ORDER — LEVOTHYROXINE SODIUM 100 MCG PO TABS
100.0000 ug | ORAL_TABLET | Freq: Every day | ORAL | Status: DC
  Administered 2024-05-29: 100 ug via ORAL
  Filled 2024-05-28: qty 1

## 2024-05-28 MED ORDER — SENNOSIDES-DOCUSATE SODIUM 8.6-50 MG PO TABS: 1.0000 | ORAL_TABLET | Freq: Every evening | ORAL | Status: DC | PRN

## 2024-05-28 MED ORDER — NICOTINE 7 MG/24HR TD PT24
7.0000 mg | MEDICATED_PATCH | Freq: Every day | TRANSDERMAL | Status: DC
  Administered 2024-05-28 – 2024-05-29 (×2): 7 mg via TRANSDERMAL
  Filled 2024-05-28 (×2): qty 1

## 2024-05-28 MED ORDER — SODIUM CHLORIDE 0.9 % IV SOLN: INTRAVENOUS | Status: DC

## 2024-05-28 MED ORDER — ENOXAPARIN SODIUM 40 MG/0.4ML IJ SOSY
0.5000 mg/kg | PREFILLED_SYRINGE | INTRAMUSCULAR | Status: DC
  Administered 2024-05-28: 42.5 mg via SUBCUTANEOUS
  Filled 2024-05-28: qty 0.8

## 2024-05-28 MED ORDER — STROKE: EARLY STAGES OF RECOVERY BOOK: Freq: Once | Status: AC

## 2024-05-28 MED ORDER — FLUTICASONE FUROATE-VILANTEROL 100-25 MCG/ACT IN AEPB
1.0000 | INHALATION_SPRAY | Freq: Every day | RESPIRATORY_TRACT | Status: DC
  Administered 2024-05-29: 1 via RESPIRATORY_TRACT
  Filled 2024-05-28: qty 28

## 2024-05-28 MED ORDER — IOHEXOL 350 MG/ML SOLN
100.0000 mL | Freq: Once | INTRAVENOUS | Status: AC | PRN
  Administered 2024-05-28: 100 mL via INTRAVENOUS

## 2024-05-28 MED ORDER — ACETAMINOPHEN 325 MG PO TABS: 650.0000 mg | ORAL_TABLET | ORAL | Status: DC | PRN

## 2024-05-28 MED ORDER — CLOPIDOGREL BISULFATE 75 MG PO TABS
75.0000 mg | ORAL_TABLET | Freq: Every day | ORAL | Status: DC
Start: 2024-05-28 — End: 2024-05-29
  Administered 2024-05-28 – 2024-05-29 (×2): 75 mg via ORAL
  Filled 2024-05-28 (×2): qty 1

## 2024-05-28 NOTE — ED Provider Notes (Signed)
 [place] EMERGENCY DEPARTMENT AT [facility] Provider Note   CSN: [id number] Arrival date & time: 05/28/24  1339     Patient presents with: Altered Mental Status   Linda Lin is a 53 y.o. female.   Patient states that when she woke up this morning she had some slurred speech and was unsteady walking.  She has a history of a stroke where she has had some unsteadiness before.  The history is provided by the patient and medical records. No language interpreter was used.  Altered Mental Status Presenting symptoms: no behavior changes and no confusion   Presenting symptoms comment:  Slurred speech Severity:  Mild Most recent episode:  Today Episode history:  Single Timing:  Unable to specify Progression:  Resolved Chronicity:  New Context: not alcohol use   Associated symptoms: no abdominal pain, no hallucinations, no headaches, no rash and no seizures        Prior to Admission medications   Medication Sig Start Date End Date Taking? Authorizing Provider  acetaminophen  (TYLENOL ) 325 MG tablet Take 2 tablets (650 mg total) by mouth every 4 (four) hours as needed for mild pain (pain score 1-3) (or temp > 37.5 C (99.5 F)). 02/07/24   Pearlean Manus, MD  albuterol  (PROVENTIL ) (2.5 MG/3ML) 0.083% nebulizer solution Take 3 mLs (2.5 mg total) by nebulization every 4 (four) hours as needed for wheezing or shortness of breath. 02/07/24 02/06/25  Pearlean Manus, MD  albuterol  (VENTOLIN  HFA) 108 (90 Base) MCG/ACT inhaler Inhale 2 puffs into the lungs every 6 (six) hours as needed for wheezing or shortness of breath. 02/07/24   Pearlean Manus, MD  clopidogrel  (PLAVIX ) 75 MG tablet Take 1 tablet (75 mg total) by mouth daily. take Aspirin  81 mg daily along with Plavix  75 mg daily for 21 days then after that STOP the Aspirin  and continue ONLY Plavix  75 mg daily indefinitely-- 02/07/24 02/06/25  Pearlean Manus, MD  fluticasone -salmeterol (ADVAIR) 100-50 MCG/ACT AEPB Inhale 1  puff into the lungs 2 (two) times daily. 02/07/24   Pearlean Manus, MD  levothyroxine  (SYNTHROID ) 100 MCG tablet Take 1 tablet (100 mcg total) by mouth daily before breakfast. 02/07/24   Pearlean Manus, MD  losartan -hydrochlorothiazide (HYZAAR) 50-12.5 MG tablet Take 1 tablet by mouth daily. 02/07/24 02/06/25  Pearlean Manus, MD  metFORMIN  (GLUCOPHAGE ) 500 MG tablet Take 1 tablet (500 mg total) by mouth 2 (two) times daily with a meal. 02/07/24   Emokpae, Courage, MD  metoprolol  succinate (TOPROL  XL) 50 MG 24 hr tablet Take 1 tablet (50 mg total) by mouth daily. Take with or immediately following a meal. 02/07/24 02/06/25  Emokpae, Courage, MD  oxyCODONE  (OXY IR/ROXICODONE ) 5 MG immediate release tablet TAKE 1 TABLET BY MOUTH EVERY 4 TO 6 HOURS FOR 7 DAYS    [provider]  rosuvastatin  (CRESTOR ) 40 MG tablet Take 1 tablet (40 mg total) by mouth daily. 02/07/24   Pearlean Manus, MD  Vitamin D , Ergocalciferol , (DRISDOL ) 1.25 MG (50000 UNIT) CAPS capsule Take 1 capsule (50,000 Units total) by mouth every 7 (seven) days. 01/01/22   Tobie Suzzane POUR, MD    Allergies: Other, Cephalexin , and Doxycycline     Review of Systems  Constitutional:  Negative for appetite change and fatigue.  HENT:  Negative for congestion, ear discharge and sinus pressure.   Eyes:  Negative for discharge.  Respiratory:  Negative for cough.   Cardiovascular:  Negative for chest pain.  Gastrointestinal:  Negative for abdominal pain and diarrhea.  Genitourinary:  Negative for frequency and hematuria.  Musculoskeletal:  Negative for back pain.  Skin:  Negative for rash.  Neurological:  Negative for seizures and headaches.       Slurred speech, difficulty walking  Psychiatric/Behavioral:  Negative for confusion and hallucinations.     Updated Vital Signs BP (!) 140/85   Pulse (!) 52   Resp 17   Ht 5' 2 (1.575 m)   Wt 83.9 kg   LMP 05/08/2020   SpO2 100%   BMI 33.84 kg/m   Physical Exam Vitals and nursing  note reviewed.  Constitutional:      Appearance: She is well-developed.  HENT:     Head: Normocephalic.     Nose: Nose normal.  Eyes:     General: No scleral icterus.    Conjunctiva/sclera: Conjunctivae normal.  Neck:     Thyroid : No thyromegaly.  Cardiovascular:     Rate and Rhythm: Normal rate and regular rhythm.     Heart sounds: No murmur heard.    No friction rub. No gallop.  Pulmonary:     Breath sounds: No stridor. No wheezing or rales.  Chest:     Chest wall: No tenderness.  Abdominal:     General: There is no distension.     Tenderness: There is no abdominal tenderness. There is no rebound.  Musculoskeletal:        General: Normal range of motion.     Cervical back: Neck supple.     Comments: Mild ataxia lower extremity  Lymphadenopathy:     Cervical: No cervical adenopathy.  Skin:    Findings: No erythema or rash.  Neurological:     Mental Status: She is alert and oriented to person, place, and time.     Motor: No abnormal muscle tone.     Coordination: Coordination normal.  Psychiatric:        Behavior: Behavior normal.     (all labs ordered are listed, but only abnormal results are displayed) Labs Reviewed  CBC - Abnormal; Notable for the following components:      Result Value   MCV 100.2 (*)    All other components within normal limits  ETHANOL  PROTIME-INR  APTT  DIFFERENTIAL  COMPREHENSIVE METABOLIC PANEL WITH GFR  RAPID URINE DRUG SCREEN, HOSP PERFORMED  CBG MONITORING, ED  I-STAT CHEM 8, ED    EKG: None  Radiology: No results found.   Procedures   Medications Ordered in the ED  iohexol  (OMNIPAQUE ) 350 MG/ML injection 100 mL (100 mLs Intravenous Contrast Given 05/28/24 1434)  CRITICAL CARE Performed by: Fairy Sermon Total critical care time: 45 minutes Critical care time was exclusive of separately billable procedures and treating other patients. Critical care was necessary to treat or prevent imminent or life-threatening  deterioration. Critical care was time spent personally by me on the following activities: development of treatment plan with patient and/or surrogate as well as nursing, discussions with consultants, evaluation of patient's response to treatment, examination of patient, obtaining history from patient or surrogate, ordering and performing treatments and interventions, ordering and review of laboratory studies, ordering and review of radiographic studies, pulse oximetry and re-evaluation of patient's condition.  Code stroke was activated when patient arrived                                 Medical Decision Making Amount and/or Complexity of Data Reviewed Labs: ordered. Radiology: ordered.  Risk Prescription  drug management. Decision regarding hospitalization.   Stroke symptoms.  Disposition will be determined by Dr. Towana after the MRI.    MRI showed acute stroke.  Patient was admitted for acute stroke     Final diagnoses:  None    ED Discharge Orders     None          Suzette Pac, MD 05/29/24 1740

## 2024-05-28 NOTE — Progress Notes (Signed)
 PHARMACIST - PHYSICIAN COMMUNICATION  CONCERNING:  Enoxaparin  (Lovenox ) for DVT Prophylaxis    RECOMMENDATION: Patient was prescribed enoxaprin 40mg  q24 hours for VTE prophylaxis.   Filed Weights   05/28/24 1349  Weight: 83.9 kg (185 lb)    Body mass index is 33.84 kg/m.  Estimated Creatinine Clearance: 71.7 mL/min (by C-G formula based on SCr of 0.9 mg/dL).   Based on [facility] policy patient is candidate for enoxaparin  0.5mg /kg TBW SQ every 24 hours based on BMI being >30.  DESCRIPTION: Pharmacy has adjusted enoxaparin  dose per [facility] policy.  Patient is now receiving enoxaparin  42.5 mg every 24 hours    Damien Napoleon, PharmD Clinical Pharmacist  05/28/2024 8:22 PM

## 2024-05-28 NOTE — ED Notes (Signed)
CODE STROKE activated at this time.

## 2024-05-28 NOTE — ED Provider Notes (Signed)
 Signout from Dr. Zammit.  53 year old female with prior history of stroke here with wake-up slurred speech and unsteady gait.  Neurology Dr. Michaela aware of patient.  Plan is to follow-up on labs and MRI.  If no acute findings likely can be discharged for outpatient follow-up with her neurology team. Physical Exam  BP (!) 140/85   Pulse (!) 52   Resp 17   Ht 5' 2 (1.575 m)   Wt 83.9 kg   LMP 05/08/2020   SpO2 100%   BMI 33.84 kg/m   Physical Exam  Procedures  Procedures  ED Course / MDM    Medical Decision Making Amount and/or Complexity of Data Reviewed Labs: ordered. Radiology: ordered.  Risk Prescription drug management.   6 PM.  Patient's MRI positive for multiple strokes.  I messaged neurology Dr. Michaela.  He felt the patient would need admission, can stay at [facility] if the hospitalists are comfortable with chart review evaluation by neurology otherwise would need [facility].  I updated the patient and she is agreeable to admission.  Consult to hospitalist placed       Towana Ozell BROCKS, MD 05/29/24 [phone]

## 2024-05-28 NOTE — H&P (Signed)
 History and Physical    Patient: Linda Lin FMW:[id number] DOB: 1971/03/09 DOA: 05/28/2024 DOS: the patient was seen and examined on 05/28/2024 PCP: Tobie Suzzane POUR, MD  Patient coming from: Home  Chief Complaint:  Chief Complaint  Patient presents with   Altered Mental Status   HPI: [name] is a 53 y.o. female with medical history significant of coronary artery disease with history of NSTEMI, diabetes, hypertension, history of stroke 4 months ago.  Tobacco use.  Patient presents with waking up this morning around 6 AM feeling off balance and slightly confused.  She had some slurred speech and difficulty with balance and coordination.  Due to her symptoms that were similar to her previous stroke, she presented to the hospital for evaluation.  MRI here shows 2 small strokes.  Neurology was consulted.  Review of Systems: As mentioned in the history of present illness. All other systems reviewed and are negative. Past Medical History:  Diagnosis Date   Asthma    Cellulitis    Diabetes mellitus without complication (HCC)    Heart attack (HCC) 2021   Scabies    Thyroid  disorder    Past Surgical History:  Procedure Laterality Date   CARPOMETACARPEL SUSPENSION PLASTY Right 10/16/2022   Procedure: THUMB TRAPEZIECTOMY WITH SUSPENSION PLASTY;  Surgeon: Romona Harari, MD;  Location: [facility];  Service: Orthopedics;  Laterality: Right;   LEFT HEART CATH AND CORONARY ANGIOGRAPHY N/A 06/09/2020   Procedure: LEFT HEART CATH AND CORONARY ANGIOGRAPHY;  Surgeon: Darron Deatrice LABOR, MD;  Location: [facility];  Service: Cardiovascular;  Laterality: N/A;   Social History:  reports that she has been smoking cigarettes. She started smoking about 36 years ago. She has a 9.2 pack-year smoking history. She has never used smokeless tobacco. She reports current alcohol use. She reports that she does not currently use drugs after having used the following drugs:  Marijuana.  Allergies  Allergen Reactions   Other Hives    shellfish   Cephalexin  Rash   Doxycycline  Rash    Family History  Problem Relation Age of Onset   Asthma Mother    Diabetes Father    Kidney failure Father    Hypertension Sister    CAD Maternal Grandmother    Breast cancer Cousin    Diabetes Mellitus II Brother    Hypertension Brother    Cancer Neg Hx     Prior to Admission medications   Medication Sig Start Date End Date Taking? Authorizing Provider  acetaminophen  (TYLENOL ) 325 MG tablet Take 2 tablets (650 mg total) by mouth every 4 (four) hours as needed for mild pain (pain score 1-3) (or temp > 37.5 C (99.5 F)). 02/07/24   Pearlean Manus, MD  albuterol  (PROVENTIL ) (2.5 MG/3ML) 0.083% nebulizer solution Take 3 mLs (2.5 mg total) by nebulization every 4 (four) hours as needed for wheezing or shortness of breath. 02/07/24 02/06/25  Pearlean Manus, MD  albuterol  (VENTOLIN  HFA) 108 (90 Base) MCG/ACT inhaler Inhale 2 puffs into the lungs every 6 (six) hours as needed for wheezing or shortness of breath. 02/07/24   Pearlean Manus, MD  clopidogrel  (PLAVIX ) 75 MG tablet Take 1 tablet (75 mg total) by mouth daily. take Aspirin  81 mg daily along with Plavix  75 mg daily for 21 days then after that STOP the Aspirin  and continue ONLY Plavix  75 mg daily indefinitely-- 02/07/24 02/06/25  Pearlean Manus, MD  fluticasone -salmeterol (ADVAIR) 100-50 MCG/ACT AEPB Inhale 1 puff into the lungs 2 (two)  times daily. 02/07/24   Pearlean Manus, MD  levothyroxine  (SYNTHROID ) 100 MCG tablet Take 1 tablet (100 mcg total) by mouth daily before breakfast. 02/07/24   Pearlean Manus, MD  losartan -hydrochlorothiazide (HYZAAR) 50-12.5 MG tablet Take 1 tablet by mouth daily. 02/07/24 02/06/25  Pearlean Manus, MD  metFORMIN  (GLUCOPHAGE ) 500 MG tablet Take 1 tablet (500 mg total) by mouth 2 (two) times daily with a meal. 02/07/24   Emokpae, Courage, MD  metoprolol  succinate (TOPROL  XL) 50 MG 24 hr tablet  Take 1 tablet (50 mg total) by mouth daily. Take with or immediately following a meal. 02/07/24 02/06/25  Emokpae, Courage, MD  oxyCODONE  (OXY IR/ROXICODONE ) 5 MG immediate release tablet TAKE 1 TABLET BY MOUTH EVERY 4 TO 6 HOURS FOR 7 DAYS    [provider]  rosuvastatin  (CRESTOR ) 40 MG tablet Take 1 tablet (40 mg total) by mouth daily. 02/07/24   Pearlean Manus, MD  Vitamin D , Ergocalciferol , (DRISDOL ) 1.25 MG (50000 UNIT) CAPS capsule Take 1 capsule (50,000 Units total) by mouth every 7 (seven) days. 01/01/22   Tobie Suzzane POUR, MD    Physical Exam: Vitals:   05/28/24 1830 05/28/24 1900 05/28/24 1930 05/28/24 2015  BP: (!) 140/76 (!) 150/70 (!) 140/99 135/75  Pulse:  (!) 50 (!) 51 60  Resp: 16 18 18 18   Temp:  98 F (36.7 C)  98.2 F (36.8 C)  TempSrc:  Oral  Oral  SpO2:  99% 100% 93%  Weight:      Height:       General: Middle-age female. Awake and alert and oriented x3. No acute cardiopulmonary distress.  HEENT: Normocephalic atraumatic.  Right and left ears normal in appearance.  Pupils equal, round, reactive to light. Extraocular muscles are intact. Sclerae anicteric and noninjected.  Moist mucosal membranes. No mucosal lesions.  Neck: Neck supple without lymphadenopathy. No carotid bruits. No masses palpated.  Cardiovascular: Regular rate with normal S1-S2 sounds. No murmurs, rubs, gallops auscultated. No JVD.  Respiratory: Good respiratory effort with no wheezes, rales, rhonchi. Lungs clear to auscultation bilaterally.  No accessory muscle use. Abdomen: Soft, nontender, nondistended. Active bowel sounds. No masses or hepatosplenomegaly  Skin: No rashes, lesions, or ulcerations.  Dry, warm to touch. 2+ dorsalis pedis and radial pulses. Musculoskeletal: No calf or leg pain. All major joints not erythematous nontender.  No upper or lower joint deformation.  Good ROM.  No contractures  Psychiatric: Intact judgment and insight. Pleasant and cooperative. Neurologic: No focal  neurological deficits. Strength is 5/5 and symmetric in upper and lower extremities.  Cranial nerves II through XII are grossly intact.  Data Reviewed: Reviewed by me: Labs and imaging  Assessment and Plan: No notes have been filed under this hospital service. Service: Hospitalist  Principal Problem:   Acute stroke due to ischemia [facility]) Active Problems:   Hypothyroidism following radioiodine therapy   CAD (coronary artery disease)   Essential hypertension  Acute stroke due to ischemia Observation on telemetry Echocardiogram tomorrow Hemoglobin A1c, lipid panel in the morning PT/OT/speech therapy consult Full aspirin  and Plavix   Hypertension Permissive hypertension Diabetes Check hemoglobin A1c tomorrow Continue home regimen Coronary artery disease with history of NSTEMI Continue Crestor  Hypothyroidism   Advance Care Planning:   Code Status: Full Code confirmed by patient  Consults: Neurology  Family Communication: Family members present during interview and exam  Severity of Illness: The appropriate patient status for this patient is OBSERVATION. Observation status is judged to be reasonable and necessary in order to provide  the required intensity of service to ensure the patient's safety. The patient's presenting symptoms, physical exam findings, and initial radiographic and laboratory data in the context of their medical condition is felt to place them at decreased risk for further clinical deterioration. Furthermore, it is anticipated that the patient will be medically stable for discharge from the hospital within 2 midnights of admission.   Author: Thurston Brendlinger J Caisen Mangas, DO 05/28/2024 8:19 PM  For on call review [email or web address].

## 2024-05-28 NOTE — Progress Notes (Signed)
 MRI reveals two small vessel appearing infarcts, one in the pons one in the subcortical white matter.  She will need dual antiplatelet therapy and I would add aspirin  to her Plavix  for now.  She will need lipid profile with goal LDL less than 70, A1c with goal less than seven, permissive hypertension for 48 hours then gradual correction with goal normotension, echo and telemetry.  As well as PT/OT/ST evaluation.  Aisha Seals, MD [facility]   If 7pm- 7am, please page neurology on call as listed in AMION.

## 2024-05-28 NOTE — ED Triage Notes (Signed)
 Pt stated that she began having slurred speech, balance issues and wrongful thought process when she woke up at 0600 this morning. Hx of stroke 2 months ago. Not currently taking any blood thinners

## 2024-05-28 NOTE — Consult Note (Signed)
 [facility] Telemedicine Consult   Requesting Provider: Suzette PARAS Consult Participants: Bedside nursing, patient Location of the provider: [facility] Location of the patient: [facility]  This consult was provided via telemedicine with 2-way video and audio communication. The patient/family was informed that care would be provided in this way and agreed to receive care in this manner.    Chief Complaint: Confusion  HPI: Who woke up this morning around 6 AM feeling slightly off balance and slightly confused.  She states that it was slightly similar to her previous stroke, but states that she just does not feel quite right.  She denies any focal weakness or numbness.  Due to the symptoms, a code stroke was activated.    LKW: 9/4 tnk given?: No, out of window IR Thrombectomy? No, no LVO Time of teleneurologist evaluation: 1352  Exam: There were no vitals filed for this visit.  General: In bed, NAD  1A: Level of Consciousness - 0 1B: Ask Month and Age - 0 1C: 'Blink Eyes' & 'Squeeze Hands' - 0 2: Test Horizontal Extraocular Movements - 0 3: Test Visual Fields - 0 4: Test Facial Palsy - 1 5A: Test Left Arm Motor Drift - 0 5B: Test Right Arm Motor Drift - 0 6A: Test Left Leg Motor Drift - 0 6B: Test Right Leg Motor Drift - 0 7: Test Limb Ataxia - 0 8: Test Sensation - 1 9: Test Language/Aphasia- 0 10: Test Dysarthria - 0 11: Test Extinction/Inattention - 0 NIHSS score: 2   Imaging Reviewed: CT/CTA-no acute large vessel occlusion.  She does have some age-indeterminate strokes versus small vessel white matter ischemic disease.  Labs reviewed in epic and pertinent values follow: CBG 96   Assessment: 53 year old female with a history of previous strokes who presents with vague symptoms of mild confusion and unsteadiness.  It is certainly possible that she has had a new acute ischemic stroke, but I am not certain of this.  Is also possible this could represent recrudescence of  her previous stroke symptoms.  I think an MRI would be invaluable in guiding her further care, if positive then would favor transfer to [facility] as we do not have neurology rounding at [facility] over the weekend.  Recommendations:  MRI brain Stroke workup if positive.  Aisha Seals, MD [facility] [phone]  If 7pm- 7am, please page neurology on call as listed in [facility].

## 2024-05-29 ENCOUNTER — Observation Stay (HOSPITAL_BASED_OUTPATIENT_CLINIC_OR_DEPARTMENT_OTHER): Payer: Self-pay

## 2024-05-29 DIAGNOSIS — I6389 Other cerebral infarction: Secondary | ICD-10-CM

## 2024-05-29 DIAGNOSIS — I639 Cerebral infarction, unspecified: Secondary | ICD-10-CM | POA: Diagnosis not present

## 2024-05-29 LAB — LIPID PANEL
Cholesterol: 121 mg/dL (ref 0–200)
HDL: 53 mg/dL (ref >40)
LDL Cholesterol: 55 mg/dL (ref 0–99)
Total CHOL/HDL Ratio: 2.3 ratio
Triglycerides: 65 mg/dL (ref <150)
VLDL: 13 mg/dL (ref 0–40)

## 2024-05-29 LAB — ECHOCARDIOGRAM COMPLETE
Area-P 1/2: 2.76 cm2
Height: 62 in
S' Lateral: 2.9 cm
Weight: 2977.09 [oz_av]

## 2024-05-29 MED ORDER — PANTOPRAZOLE SODIUM 40 MG PO TBEC: 40.0000 mg | DELAYED_RELEASE_TABLET | Freq: Every day | ORAL | 1 refills | Status: AC

## 2024-05-29 MED ORDER — ASPIRIN 81 MG PO TBEC: 81.0000 mg | DELAYED_RELEASE_TABLET | Freq: Every day | ORAL | 2 refills | Status: AC

## 2024-05-29 MED ORDER — SENNOSIDES-DOCUSATE SODIUM 8.6-50 MG PO TABS: 1.0000 | ORAL_TABLET | Freq: Every evening | ORAL | 0 refills | Status: AC | PRN

## 2024-05-29 MED ORDER — NICOTINE 7 MG/24HR TD PT24
7.0000 mg | MEDICATED_PATCH | Freq: Every day | TRANSDERMAL | 0 refills | Status: AC
Start: 2024-05-30 — End: ?

## 2024-05-29 NOTE — Progress Notes (Signed)
   05/29/24 1136  TOC Brief Assessment  Patient has primary care physician Yes (PATEL, RUTWIK K)  Home environment has been reviewed Pt from home  Prior level of function: Independent/recent medical complexity reports has  nuerology team/follow up  Prior/Current Home Services No current home services  Social Drivers of Health Review SDOH reviewed no interventions necessary  Readmission risk has been reviewed Yes  Transition of care needs no transition of care needs at this time

## 2024-05-29 NOTE — Evaluation (Signed)
 Physical Therapy Evaluation Patient Details Name: Linda Lin MRN: [id number] DOB: 1971-04-13 Today's Date: 05/29/2024  History of Present Illness  Linda Lin is a 53 y.o. female with medical history significant of coronary artery disease with history of NSTEMI, diabetes, hypertension, history of stroke 4 months ago.  Tobacco use.  Patient presents with waking up this morning around 6 AM feeling off balance and slightly confused.  She had some slurred speech and difficulty with balance and coordination.  Due to her symptoms that were similar to her previous stroke, she presented to the hospital for evaluation.  MRI here shows 2 small strokes.  Neurology was consulted.   Clinical Impression  Patient agreeable to PT evaluation. Patient reports at baseline, she is independent with all mobility without an AD and ADL/iADLs. Reports no therapy follow up after last CVA. On this date, patient is independent with bed mobility, and transfers without an AD. Patient given CGA/supervision during ambulation without AD due to reports of still feeling slightly off balance/incoordinated (with mild unsteadiness being of note but no overt LOB during session). Patient also demonstrates general weakness throughout with no stark difference between R/L sides. Patient reports feeling speech difficulties have resolved. Patient left sitting EOB with bed alarm set and call button within reach. Patient will benefit from skilled physical therapy in outpatient setting in order to address current deficits. Patient does not present with urgent need for skilled acute physical therapy at this time. Patient discharged to care of nursing for ambulation daily as tolerated for length of stay.         If plan is discharge home, recommend the following:     Can travel by private vehicle        Equipment Recommendations None recommended by PT  Recommendations for Other Services       Functional Status Assessment  Patient has had a recent decline in their functional status and demonstrates the ability to make significant improvements in function in a reasonable and predictable amount of time.     Precautions / Restrictions Precautions Precautions: Fall Recall of Precautions/Restrictions: Intact Restrictions Weight Bearing Restrictions Per Provider Order: No      Mobility  Bed Mobility Overal bed mobility: Modified Independent             General bed mobility comments: mod I due to inc time needed as pt does not use RUE to assist due to pain from IV, HOB flat, no use of railings    Transfers Overall transfer level: Needs assistance Equipment used: None Transfers: Sit to/from Stand Sit to Stand: Supervision, Modified independent (Device/Increase time)           General transfer comment: Labored movement with STS from EOB and toilet transfer. no overt LOB but mild unsteadiness of note    Ambulation/Gait Ambulation/Gait assistance: Supervision, Modified independent (Device/Increase time) Gait Distance (Feet): 75 Feet Assistive device: None Gait Pattern/deviations: Step-through pattern, Decreased step length - right, Decreased step length - left, Drifts right/left, Trunk flexed, Decreased stride length Gait velocity: Dec     General Gait Details: Labored movement and slight unsteadiness of note during ambulation in room and halls with no AD, no overt LOB  Stairs            Wheelchair Mobility     Tilt Bed    Modified Rankin (Stroke Patients Only)       Balance Overall balance assessment: Mild deficits observed, not formally tested  Pertinent Vitals/Pain Pain Assessment Pain Assessment: No/denies pain    Home Living Family/patient expects to be discharged to:: Private residence Living Arrangements: Other relatives Available Help at Discharge: Family;Available 24 hours/day Type of Home: Mobile home Home Access: Stairs to enter Entrance  Stairs-Rails: Left Entrance Stairs-Number of Steps: 5   Home Layout: One level Home Equipment: None Additional Comments: Reports she lives with her sister    Prior Function Prior Level of Function : Independent/Modified Independent;Driving             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 ADLs Comments: independent     Extremity/Trunk Assessment   Upper Extremity Assessment Upper Extremity Assessment: Generalized weakness (Shoulder flexion ROM WFL, MMT 4-/5 bilaterally, sensation intact bilaterally, finger to nose WNL with LUE - hindered due to pain from IV on RUE, smooth pursuits and convergence WNL with no evidence of saccades or symptoms)    Lower Extremity Assessment Lower Extremity Assessment: Generalized weakness (Ankle DF MMT 4+/5 bilat, hip flexion 4/5 bilat, sensation intact, alternating foot taps WNL, heel to shin WNL)    Cervical / Trunk Assessment Cervical / Trunk Assessment: Kyphotic  Communication   Communication Communication: No apparent difficulties    Cognition Arousal: Alert Behavior During Therapy: WFL for tasks assessed/performed       Following commands: Intact       Cueing Cueing Techniques: Verbal cues, Visual cues     General Comments      Exercises     Assessment/Plan    PT Assessment All further PT needs can be met in the next venue of care  PT Problem List Decreased strength;Decreased balance;Decreased activity tolerance       PT Treatment Interventions      PT Goals (Current goals can be found in the Care Plan section)  Acute Rehab PT Goals Patient Stated Goal: Return home with outpatient PT option PT Goal Formulation: With patient Time For Goal Achievement: 05/31/24 Potential to Achieve Goals: Good    Frequency       Co-evaluation               AM-PAC PT 6 Clicks Mobility  Outcome Measure Help needed turning from your back to your side while in a flat bed without using bedrails?:  None Help needed moving from lying on your back to sitting on the side of a flat bed without using bedrails?: None Help needed moving to and from a bed to a chair (including a wheelchair)?: A Little Help needed standing up from a chair using your arms (e.g., wheelchair or bedside chair)?: None Help needed to walk in hospital room?: A Little Help needed climbing 3-5 steps with a railing? : A Little 6 Click Score: 21    End of Session   Activity Tolerance: Patient tolerated treatment well Patient left: in bed;with bed alarm set;with call bell/phone within reach   PT Visit Diagnosis: Unsteadiness on feet (R26.81);Muscle weakness (generalized) (M62.81)    Time: 8988-8974 PT Time Calculation (min) (ACUTE ONLY): 14 min   Charges:   PT Evaluation $PT Eval Low Complexity: 1 Low   PT General Charges $$ ACUTE PT VISIT: 1 Visit         11:39 AM, 05/29/24 Rosaria Settler, PT, DPT [place] with [facility]

## 2024-05-29 NOTE — Plan of Care (Signed)
  Problem: Health Behavior/Discharge Planning: Goal: Ability to manage health-related needs will improve Outcome: Progressing   Problem: Clinical Measurements: Goal: Will remain free from infection Outcome: Progressing Goal: Respiratory complications will improve Outcome: Progressing   Problem: Activity: Goal: Risk for activity intolerance will decrease Outcome: Progressing   Problem: Coping: Goal: Level of anxiety will decrease Outcome: Progressing   Problem: Pain Managment: Goal: General experience of comfort will improve and/or be controlled Outcome: Progressing   Problem: Health Behavior/Discharge Planning: Goal: Ability to manage health-related needs will improve Outcome: Progressing   Problem: Nutrition: Goal: Risk of aspiration will decrease Outcome: Progressing

## 2024-05-29 NOTE — Progress Notes (Signed)
 Patient discharged home with instructions given on medications and follow up visits,verbalized understanding.Prescriptions picked up at Pharmacy of choice documented on AVS.IV discontinued,catheter intact. Staff to accompany patient to awaiting vehicle.

## 2024-05-29 NOTE — Progress Notes (Signed)
 Nurse at bedside,patient alert and oriented to self,place,and situation,confused to time.No c/o pain or discomfort noted this am.Plan of car ongoing.

## 2024-05-29 NOTE — Discharge Summary (Signed)
 Physician Discharge Summary   Patient: Linda Lin MRN: [id number] DOB: [birth year]  Admit date:     05/28/2024  Discharge date: 05/29/24  Discharge Physician: Adriana DELENA Grams   PCP: Tobie Suzzane POUR, MD   Recommendations at discharge:    Follow-up with neurologist in 1-2 weeks -Continue currently recommended medication of aspirin  and Plavix , with Protonix  Follow-up with PCP in 1 week  Discharge Diagnoses: Principal Problem:   Acute stroke due to ischemia [facility]) Active Problems:   Hypothyroidism following radioiodine therapy   CAD (coronary artery disease)   Essential hypertension  Resolved Problems:   * No resolved hospital problems. *  Hospital Course: Linda Lin is a 53 y.o. female with medical history significant of coronary artery disease with history of NSTEMI, diabetes, hypertension, history of stroke 4 months ago.  Tobacco use.  Patient presents with waking up this morning around 6 AM feeling off balance and slightly confused.  She had some slurred speech and difficulty with balance and coordination.  Due to her symptoms that were similar to her previous stroke, she presented to the hospital for evaluation.  MRI here shows 2 small strokes.  Neurology was consulted.       Acute stroke due to ischemia - Symptoms completely resolved, negative any leg weakness, arm weakness or change in speech Speech is back to normal, strength intact-no new focal neurological findings  -MRI-reviewed-acute new x 2 small vessel infarct morning upon/and subcortical white matter -per neurology dual antiplatelet therapy aspirin /Plavix  - Lipid profile, LDL 55, (goal  LDL > 70) - A1c: 6.7  Echo: EGF  60 to 65%.  Normal LV function, mild LVH, ventricular diastolic parameters are consistent with Grade I diastolic dysfunction (impaired relaxation).   2. Right ventricular systolic function is normal.  No valvular abnormalities -report does not indicate any PFO  - Consulting  PT/OT/speech --no significant need  Hypertension Permissive hypertension recommended to continue holding HCTZ  Diabetes type II Resuming home medication of metformin  in a.m 05/30/2024 Continue carb modified diabetic diet   Coronary artery disease with history of NSTEMI Continue Crestor  + ASA + Plavix    Hypothyroidism TSH:   ?  UTI, UA negative for any leukocyte esterase, positive for nitrites, rare bacteria, WBC 0-4 Not complaining of any dysuria, with holding biotics use          Consultants: Teleneurology Procedures performed: CT/MRI of the head/brain, echocardiogram Disposition: Home Diet recommendation:  Discharge Diet Orders (From admission, onward)     Start     Ordered   05/29/24 0000  Diet - low sodium heart healthy        05/29/24 1204           Cardiac diet DISCHARGE MEDICATION: Allergies as of 05/29/2024       Reactions   Other Hives   shellfish   Cephalexin  Rash   Doxycycline  Rash        Medication List     STOP taking these medications    losartan -hydrochlorothiazide 50-12.5 MG tablet Commonly known as: Hyzaar       TAKE these medications    acetaminophen  325 MG tablet Commonly known as: TYLENOL  Take 2 tablets (650 mg total) by mouth every 4 (four) hours as needed for mild pain (pain score 1-3) (or temp > 37.5 C (99.5 F)).   albuterol  (2.5 MG/3ML) 0.083% nebulizer solution Commonly known as: PROVENTIL  Take 3 mLs (2.5 mg total) by nebulization every 4 (four) hours as needed for wheezing or shortness of  breath.   albuterol  108 (90 Base) MCG/ACT inhaler Commonly known as: VENTOLIN  HFA Inhale 2 puffs into the lungs every 6 (six) hours as needed for wheezing or shortness of breath.   aspirin  EC 81 MG tablet Take 1 tablet (81 mg total) by mouth daily. Swallow whole.   clopidogrel  75 MG tablet Commonly known as: Plavix  Take 1 tablet (75 mg total) by mouth daily. take Aspirin  81 mg daily along with Plavix  75 mg daily for 21 days  then after that STOP the Aspirin  and continue ONLY Plavix  75 mg daily indefinitely--   fluticasone -salmeterol 100-50 MCG/ACT Aepb Commonly known as: ADVAIR Inhale 1 puff into the lungs 2 (two) times daily.   levothyroxine  100 MCG tablet Commonly known as: SYNTHROID  Take 1 tablet (100 mcg total) by mouth daily before breakfast.   metFORMIN  500 MG tablet Commonly known as: GLUCOPHAGE  Take 1 tablet (500 mg total) by mouth 2 (two) times daily with a meal.   metoprolol  succinate 50 MG 24 hr tablet Commonly known as: Toprol  XL Take 1 tablet (50 mg total) by mouth daily. Take with or immediately following a meal.   nicotine  7 mg/24hr patch Commonly known as: NICODERM CQ  - dosed in mg/24 hr Place 1 patch (7 mg total) onto the skin daily. Start taking on: May 30, 2024   oxyCODONE  5 MG immediate release tablet Commonly known as: Oxy IR/ROXICODONE  TAKE 1 TABLET BY MOUTH EVERY 4 TO 6 HOURS FOR 7 DAYS   pantoprazole  40 MG tablet Commonly known as: Protonix  Take 1 tablet (40 mg total) by mouth daily.   rosuvastatin  40 MG tablet Commonly known as: CRESTOR  Take 1 tablet (40 mg total) by mouth daily.   senna-docusate 8.6-50 MG tablet Commonly known as: Senokot-S Take 1 tablet by mouth at bedtime as needed for up to 10 days for mild constipation.   Vitamin D  (Ergocalciferol ) 1.25 MG (50000 UNIT) Caps capsule Commonly known as: DRISDOL  Take 1 capsule (50,000 Units total) by mouth every 7 (seven) days.        Discharge Exam: Filed Weights   05/28/24 1349 05/28/24 2015  Weight: 83.9 kg 84.4 kg        General:  AAO x 3,  cooperative, no distress;   HEENT:  Normocephalic, PERRL, otherwise with in Normal limits   Neuro:  CNII-XII intact. , normal motor and sensation, reflexes intact   Lungs:   Clear to auscultation BL, Respirations unlabored,  No wheezes / crackles  Cardio:    S1/S2, RRR, No murmure, No Rubs or Gallops   Abdomen:  Soft, non-tender, bowel sounds active all  four quadrants, no guarding or peritoneal signs.  Muscular  skeletal:  Limited exam -global generalized weaknesses - in bed, able to move all 4 extremities,   2+ pulses,  symmetric, No pitting edema  Skin:  Dry, warm to touch, negative for any Rashes,  Wounds: Please see nursing documentation          Condition at discharge: good  The results of significant diagnostics from this hospitalization (including imaging, microbiology, ancillary and laboratory) are listed below for reference.   Imaging Studies: ECHOCARDIOGRAM COMPLETE Result Date: 05/29/2024    ECHOCARDIOGRAM REPORT   Patient Name:   TABITA CORBO Date of Exam: 05/29/2024 Medical Rec #:  [id number]          Height:       62.0 in Accession #:    [id number]         Weight:  186.1 lb Date of Birth:  11-27-1970          BSA:          1.854 m Patient Age:    53 years           BP:           124/73 mmHg Patient Gender: F                  HR:           58 bpm. Exam Location:  [facility] Procedure: 2D Echo, 3D Echo, Cardiac Doppler, Color Doppler and Strain Analysis            (Both Spectral and Color Flow Doppler were utilized during            procedure). Indications:    Stroke  History:        Patient has prior history of Echocardiogram examinations, most                 recent 02/06/2024. CAD; Risk Factors:Hypertension and                 Dyslipidemia.  Sonographer:    Philomena Daring Referring Phys: [id number] JACOB J STINSON IMPRESSIONS  1. Left ventricular ejection fraction, by estimation, is 60 to 65%. The left ventricle has normal function. The left ventricle has no regional wall motion abnormalities. There is mild left ventricular hypertrophy of the basal-septal segment. Left ventricular diastolic parameters are consistent with Grade I diastolic dysfunction (impaired relaxation).  2. Right ventricular systolic function is normal. The right ventricular size is normal.  3. The mitral valve is normal in structure. No evidence of mitral valve  regurgitation. No evidence of mitral stenosis.  4. The aortic valve is tricuspid. Aortic valve regurgitation is not visualized. No aortic stenosis is present.  5. The inferior vena cava is normal in size with greater than 50% respiratory variability, suggesting right atrial pressure of 3 mmHg. FINDINGS  Left Ventricle: Left ventricular ejection fraction, by estimation, is 60 to 65%. The left ventricle has normal function. The left ventricle has no regional wall motion abnormalities. Strain was performed and the global longitudinal strain is indeterminate. The left ventricular internal cavity size was normal in size. There is mild left ventricular hypertrophy of the basal-septal segment. Left ventricular diastolic parameters are consistent with Grade I diastolic dysfunction (impaired relaxation). Right Ventricle: The right ventricular size is normal. Right ventricular systolic function is normal. Left Atrium: Left atrial size was normal in size. Right Atrium: Right atrial size was normal in size. Pericardium: Trivial pericardial effusion is present. Mitral Valve: The mitral valve is normal in structure. Mild mitral annular calcification. No evidence of mitral valve regurgitation. No evidence of mitral valve stenosis. Tricuspid Valve: The tricuspid valve is normal in structure. Tricuspid valve regurgitation is trivial. No evidence of tricuspid stenosis. Aortic Valve: The aortic valve is tricuspid. Aortic valve regurgitation is not visualized. No aortic stenosis is present. Pulmonic Valve: The pulmonic valve was grossly normal. Pulmonic valve regurgitation is not visualized. No evidence of pulmonic stenosis. Aorta: The aortic root is normal in size and structure. Venous: The inferior vena cava is normal in size with greater than 50% respiratory variability, suggesting right atrial pressure of 3 mmHg. IAS/Shunts: No atrial level shunt detected by color flow Doppler. Additional Comments: 3D was performed not requiring  image post processing on an independent workstation and was normal.  LEFT VENTRICLE PLAX 2D LVIDd:  4.70 cm   Diastology LVIDs:         2.90 cm   LV e' medial:    7.07 cm/s LV PW:         1.10 cm   LV E/e' medial:  10.0 LV IVS:        1.10 cm   LV e' lateral:   8.59 cm/s LVOT diam:     1.80 cm   LV E/e' lateral: 8.2 LV SV:         59 LV SV Index:   32 LVOT Area:     2.54 cm                           3D Volume EF:                          3D EF:        64 %                          LV EDV:       101 ml                          LV ESV:       36 ml                          LV SV:        65 ml RIGHT VENTRICLE             IVC RV S prime:     11.70 cm/s  IVC diam: 1.30 cm TAPSE (M-mode): 1.9 cm LEFT ATRIUM             Index        RIGHT ATRIUM           Index LA diam:        3.20 cm 1.73 cm/m   RA Area:     11.90 cm LA Vol (A2C):   23.0 ml 12.41 ml/m  RA Volume:   26.50 ml  14.29 ml/m LA Vol (A4C):   31.5 ml 16.99 ml/m LA Biplane Vol: 29.4 ml 15.86 ml/m  AORTIC VALVE LVOT Vmax:   108.00 cm/s LVOT Vmean:  69.200 cm/s LVOT VTI:    0.230 m  AORTA Ao Root diam: 2.80 cm Ao Asc diam:  3.00 cm MITRAL VALVE MV Area (PHT): 2.76 cm    SHUNTS MV Decel Time: 275 msec    Systemic VTI:  0.23 m MV E velocity: 70.70 cm/s  Systemic Diam: 1.80 cm MV A velocity: 80.70 cm/s MV E/A ratio:  0.88 Redell Shallow MD Electronically signed by Redell Shallow MD Signature Date/Time: 05/29/2024/10:58:09 AM    Final    MR BRAIN WO CONTRAST Result Date: 05/28/2024 CLINICAL DATA:  Mental status change, unknown cause EXAM: MRI HEAD WITHOUT CONTRAST TECHNIQUE: Multiplanar, multiecho pulse sequences of the brain and surrounding structures were obtained without intravenous contrast. COMPARISON:  Same day CT head.  MRI head Feb 06, 2024. FINDINGS: Brain: Small acute infarct in the left pons. Small acute infarct in the left periventricular frontal white matter adjacent to the frontal horn of the lateral ventricle. No mass effect. Moderate  T2/FLAIR hyperintensities in the white matter, advanced for age. Chronic lacunar infarcts in the thalami, left thalamus capsular junction, cerebellum  and periventricular corona radiata. No evidence of acute hemorrhage, mass lesion, midline shift or hydrocephalus. Vascular: Major arterial flow voids are maintained at the skull base. Skull and upper cervical spine: Normal marrow signal. Sinuses/Orbits: Clear sinuses.  No acute orbital findings. IMPRESSION: 1. Small acute infarcts in the left pons and left periventricular frontal white matter. 2. Accelerated chronic microvascular ischemic disease and remote lacunar infarcts. Electronically Signed   By: Gilmore GORMAN Molt M.D.   On: 05/28/2024 17:44   CT HEAD CODE STROKE WO CONTRAST Result Date: 05/28/2024 CLINICAL DATA:  Neuro deficit, acute, stroke suspected. Additional history provided: Slurred speech, balance difficulty, history of prior stroke. EXAM: CT ANGIOGRAPHY HEAD AND NECK CT PERFUSION BRAIN TECHNIQUE: Multidetector CT imaging of the head and neck was performed using the standard protocol during bolus administration of intravenous contrast. Multiplanar CT image reconstructions and MIPs were obtained to evaluate the vascular anatomy. Carotid stenosis measurements (when applicable) are obtained utilizing NASCET criteria, using the distal internal carotid diameter as the denominator. Multiphase CT imaging of the brain was performed following IV bolus contrast injection. Subsequent parametric perfusion maps were calculated using RAPID software. RADIATION DOSE REDUCTION: This exam was performed according to the departmental dose-optimization program which includes automated exposure control, adjustment of the mA and/or kV according to patient size and/or use of iterative reconstruction technique. CONTRAST:  OMNIPAQUE  IOHEXOL  350 MG/ML SOLN COMPARISON:  Non-contrast head CT and CT angiogram head/neck 02/07/2024. Brain MRI 02/06/2024. FINDINGS: CT HEAD  FINDINGS Brain: No age-advanced or lobar predominant cerebral atrophy. Lacunar infarcts within the anterior limb of left internal capsule and posterior right temporal lobe white matter, new from the prior head CT of 02/07/2024 but otherwise age-indeterminate (series 2, image 22). Known chronic lacunar infarcts elsewhere within the left frontal white matter and thalami. Background moderate cerebral white matter chronic small vessel ischemic disease, significantly greater than expected for age. Known small chronic infarct within the right cerebellar hemisphere. There is no acute intracranial hemorrhage. No demarcated cortical infarct. No extra-axial fluid collection. No evidence of an intracranial mass. No midline shift. Vascular: No hyperdense vessel.  Atherosclerotic calcifications. Skull: No calvarial fracture or aggressive osseous lesion. Sinuses/Orbits: No orbital mass or acute orbital finding. No significant paranasal sinus disease. ASPECTS ([place] Stroke Program Early CT Score) - Ganglionic level infarction (caudate, lentiform nuclei, internal capsule, insula, M1-M3 cortex): 6 - Supraganglionic infarction (M4-M6 cortex): 3 Total score (0-10 with 10 being normal): 9 Review of the MIP images confirms the above findings Lacunar infarcts in the anterior limb of left internal capsule and posterior right temporal lobe white matter, new from the prior head CT of 02/07/2024 but otherwise age-indeterminate. These results were communicated to Dr. Michaela at 2:56 pmon 9/5/2025by text page via the [facility] messaging system. CTA NECK FINDINGS Aortic arch: Standard aortic branching. Atherosclerotic plaque within the aortic arch. No hemodynamically significant innominate or proximal subclavian artery stenosis. Right carotid system: CCA and ICA patent within the neck without stenosis. Mild atherosclerotic plaque within the mid CCA and about the carotid bifurcation. Tortuosity of the cervical ICA. Left carotid system: CCA and  ICA patent within the neck without stenosis or significant atherosclerotic disease. Tortuosity of the cervical ICA. Vertebral arteries: Patent within the neck without stenosis or significant atherosclerotic disease. The right vertebral artery is dominant. Skeleton: Poor dentition with dental and periodontal disease. Cervical spondylosis. Other neck: No neck mass or cervical lymphadenopathy. Upper chest: No consolidation within the imaged lung apices. Centrilobular emphysema. Review of the MIP images confirms  the above findings CTA HEAD FINDINGS Anterior circulation: The intracranial internal carotid arteries are patent. Atherosclerotic plaque within both vessels with no more than mild stenosis. The M1 middle cerebral arteries are patent. No M2 proximal branch occlusion or high-grade proximal stenosis. The anterior cerebral arteries are patent. No intracranial aneurysm is identified. Posterior circulation: The intracranial vertebral arteries are patent. The basilar artery is patent. The posterior cerebral arteries are patent. Posterior communicating arteries are diminutive or absent, bilaterally. Venous sinuses: Within the limitations of contrast timing, no convincing thrombus. Anatomic variants: As described. Review of the MIP images confirms the above findings CT Brain Perfusion Findings: CBF (<30%) Volume: 0mL Perfusion (Tmax>6.0s) volume: 0mL Mismatch Volume: 0mL Infarction Location:None identified. No emergent large vessel occlusion identified. These results were communicated to Dr. Michaela at 3:15 pmon 9/5/2025by text page via the [facility] messaging system. IMPRESSION: Non-contrast head CT: 1. Lacunar infarcts in the anterior limb of left internal capsule and posterior right temporal lobe white matter, new from the prior head CT of 02/07/2024 but otherwise age-indeterminate. Consider brain MRI for further evaluation. 2. Background parenchymal atrophy, chronic small vessel ischemic disease and chronic infarcts,  as described. CTA neck: 1. The common carotid and internal carotid arteries are patent within the neck without stenosis. Mild atherosclerotic plaque within mid common carotid artery and about the carotid bifurcation on the right. 2. Vertebral arteries patent within the neck without stenosis or significant atherosclerotic disease. 3. Aortic Atherosclerosis (ICD10-I70.0) and Emphysema (ICD10-J43.9). CTA head: 1. No proximal intracranial large vessel occlusion or high-grade proximal arterial stenosis is identified. 2. Atherosclerotic plaque within the intracranial internal carotid arteries with no more than mild stenosis. CT perfusion head: The perfusion software identifies no core infarct. The perfusion software identifies no critically hypoperfused parenchyma (utilizing the Tmax>6 seconds threshold). No mismatch volume is reported. Electronically Signed   By: Rockey Childs D.O.   On: 05/28/2024 15:18   CT ANGIO HEAD NECK W WO CM W PERF (CODE STROKE) Result Date: 05/28/2024 CLINICAL DATA:  Neuro deficit, acute, stroke suspected. Additional history provided: Slurred speech, balance difficulty, history of prior stroke. EXAM: CT ANGIOGRAPHY HEAD AND NECK CT PERFUSION BRAIN TECHNIQUE: Multidetector CT imaging of the head and neck was performed using the standard protocol during bolus administration of intravenous contrast. Multiplanar CT image reconstructions and MIPs were obtained to evaluate the vascular anatomy. Carotid stenosis measurements (when applicable) are obtained utilizing NASCET criteria, using the distal internal carotid diameter as the denominator. Multiphase CT imaging of the brain was performed following IV bolus contrast injection. Subsequent parametric perfusion maps were calculated using RAPID software. RADIATION DOSE REDUCTION: This exam was performed according to the departmental dose-optimization program which includes automated exposure control, adjustment of the mA and/or kV according to patient  size and/or use of iterative reconstruction technique. CONTRAST:  OMNIPAQUE  IOHEXOL  350 MG/ML SOLN COMPARISON:  Non-contrast head CT and CT angiogram head/neck 02/07/2024. Brain MRI 02/06/2024. FINDINGS: CT HEAD FINDINGS Brain: No age-advanced or lobar predominant cerebral atrophy. Lacunar infarcts within the anterior limb of left internal capsule and posterior right temporal lobe white matter, new from the prior head CT of 02/07/2024 but otherwise age-indeterminate (series 2, image 22). Known chronic lacunar infarcts elsewhere within the left frontal white matter and thalami. Background moderate cerebral white matter chronic small vessel ischemic disease, significantly greater than expected for age. Known small chronic infarct within the right cerebellar hemisphere. There is no acute intracranial hemorrhage. No demarcated cortical infarct. No extra-axial fluid collection. No evidence of  an intracranial mass. No midline shift. Vascular: No hyperdense vessel.  Atherosclerotic calcifications. Skull: No calvarial fracture or aggressive osseous lesion. Sinuses/Orbits: No orbital mass or acute orbital finding. No significant paranasal sinus disease. ASPECTS ([place] Stroke Program Early CT Score) - Ganglionic level infarction (caudate, lentiform nuclei, internal capsule, insula, M1-M3 cortex): 6 - Supraganglionic infarction (M4-M6 cortex): 3 Total score (0-10 with 10 being normal): 9 Review of the MIP images confirms the above findings Lacunar infarcts in the anterior limb of left internal capsule and posterior right temporal lobe white matter, new from the prior head CT of 02/07/2024 but otherwise age-indeterminate. These results were communicated to Dr. Michaela at 2:56 pmon 9/5/2025by text page via the [facility] messaging system. CTA NECK FINDINGS Aortic arch: Standard aortic branching. Atherosclerotic plaque within the aortic arch. No hemodynamically significant innominate or proximal subclavian artery stenosis.  Right carotid system: CCA and ICA patent within the neck without stenosis. Mild atherosclerotic plaque within the mid CCA and about the carotid bifurcation. Tortuosity of the cervical ICA. Left carotid system: CCA and ICA patent within the neck without stenosis or significant atherosclerotic disease. Tortuosity of the cervical ICA. Vertebral arteries: Patent within the neck without stenosis or significant atherosclerotic disease. The right vertebral artery is dominant. Skeleton: Poor dentition with dental and periodontal disease. Cervical spondylosis. Other neck: No neck mass or cervical lymphadenopathy. Upper chest: No consolidation within the imaged lung apices. Centrilobular emphysema. Review of the MIP images confirms the above findings CTA HEAD FINDINGS Anterior circulation: The intracranial internal carotid arteries are patent. Atherosclerotic plaque within both vessels with no more than mild stenosis. The M1 middle cerebral arteries are patent. No M2 proximal branch occlusion or high-grade proximal stenosis. The anterior cerebral arteries are patent. No intracranial aneurysm is identified. Posterior circulation: The intracranial vertebral arteries are patent. The basilar artery is patent. The posterior cerebral arteries are patent. Posterior communicating arteries are diminutive or absent, bilaterally. Venous sinuses: Within the limitations of contrast timing, no convincing thrombus. Anatomic variants: As described. Review of the MIP images confirms the above findings CT Brain Perfusion Findings: CBF (<30%) Volume: 0mL Perfusion (Tmax>6.0s) volume: 0mL Mismatch Volume: 0mL Infarction Location:None identified. No emergent large vessel occlusion identified. These results were communicated to Dr. Michaela at 3:15 pmon 9/5/2025by text page via the [facility] messaging system. IMPRESSION: Non-contrast head CT: 1. Lacunar infarcts in the anterior limb of left internal capsule and posterior right temporal lobe white  matter, new from the prior head CT of 02/07/2024 but otherwise age-indeterminate. Consider brain MRI for further evaluation. 2. Background parenchymal atrophy, chronic small vessel ischemic disease and chronic infarcts, as described. CTA neck: 1. The common carotid and internal carotid arteries are patent within the neck without stenosis. Mild atherosclerotic plaque within mid common carotid artery and about the carotid bifurcation on the right. 2. Vertebral arteries patent within the neck without stenosis or significant atherosclerotic disease. 3. Aortic Atherosclerosis (ICD10-I70.0) and Emphysema (ICD10-J43.9). CTA head: 1. No proximal intracranial large vessel occlusion or high-grade proximal arterial stenosis is identified. 2. Atherosclerotic plaque within the intracranial internal carotid arteries with no more than mild stenosis. CT perfusion head: The perfusion software identifies no core infarct. The perfusion software identifies no critically hypoperfused parenchyma (utilizing the Tmax>6 seconds threshold). No mismatch volume is reported. Electronically Signed   By: Rockey Childs D.O.   On: 05/28/2024 15:18    Microbiology: Results for orders placed or performed during the hospital encounter of 11/24/20  Resp Panel by RT-PCR (Flu A&B, Covid)  Nasopharyngeal Swab     Status: None   Collection Time: 11/24/20  2:21 PM   Specimen: Nasopharyngeal Swab; Nasopharyngeal(NP) swabs in vial transport medium  Result Value Ref Range Status   SARS Coronavirus 2 by RT PCR NEGATIVE NEGATIVE Final    Comment: (NOTE) SARS-CoV-2 target nucleic acids are NOT DETECTED.  The SARS-CoV-2 RNA is generally detectable in upper respiratory specimens during the acute phase of infection. The lowest concentration of SARS-CoV-2 viral copies this assay can detect is 138 copies/mL. A negative result does not preclude SARS-Cov-2 infection and should not be used as the sole basis for treatment or other patient management  decisions. A negative result may occur with  improper specimen collection/handling, submission of specimen other than nasopharyngeal swab, presence of viral mutation(s) within the areas targeted by this assay, and inadequate number of viral copies(<138 copies/mL). A negative result must be combined with clinical observations, patient history, and epidemiological information. The expected result is Negative.  Fact Sheet for Patients:  BloggerCourse.com  Fact Sheet for Healthcare Providers:  SeriousBroker.it  This test is no t yet approved or cleared by the [place]  FDA and  has been authorized for detection and/or diagnosis of SARS-CoV-2 by FDA under an Emergency Use Authorization (EUA). This EUA will remain  in effect (meaning this test can be used) for the duration of the COVID-19 declaration under Section 564(b)(1) of the Act, 21 U.S.C.section 360bbb-3(b)(1), unless the authorization is terminated  or revoked sooner.       Influenza A by PCR NEGATIVE NEGATIVE Final   Influenza B by PCR NEGATIVE NEGATIVE Final    Comment: (NOTE) The Xpert Xpress SARS-CoV-2/FLU/RSV plus assay is intended as an aid in the diagnosis of influenza from Nasopharyngeal swab specimens and should not be used as a sole basis for treatment. Nasal washings and aspirates are unacceptable for Xpert Xpress SARS-CoV-2/FLU/RSV testing.  Fact Sheet for Patients: BloggerCourse.com  Fact Sheet for Healthcare Providers: SeriousBroker.it  This test is not yet approved or cleared by the [place]  FDA and has been authorized for detection and/or diagnosis of SARS-CoV-2 by FDA under an Emergency Use Authorization (EUA). This EUA will remain in effect (meaning this test can be used) for the duration of the COVID-19 declaration under Section 564(b)(1) of the Act, 21 U.S.C. section 360bbb-3(b)(1), unless the  authorization is terminated or revoked.  Performed at [facility], [address] [place], [place] [address]     Labs: CBC: Recent Labs  Lab 05/28/24 1404  WBC 7.1  NEUTROABS 3.2  HGB 13.9  HCT 41.8  MCV 100.2*  PLT 266   Basic Metabolic Panel: Recent Labs  Lab 05/28/24 1404  NA 137  K 3.9  CL 102  CO2 25  GLUCOSE 98  BUN 13  CREATININE 0.90  CALCIUM  9.7   Liver Function Tests: Recent Labs  Lab 05/28/24 1404  AST 18  ALT 15  ALKPHOS 92  BILITOT 0.5  PROT 7.7  ALBUMIN 3.5   CBG: Recent Labs  Lab 05/28/24 1351  GLUCAP 96    Discharge time spent: greater than 30 minutes.  Signed: Adriana DELENA Grams, MD [facility] 05/29/2024

## 2024-05-29 NOTE — Hospital Course (Addendum)
 Linda Lin is a 53 y.o. female with medical history significant of coronary artery disease with history of NSTEMI, diabetes, hypertension, history of stroke 4 months ago.  Tobacco use.  Patient presents with waking up this morning around 6 AM feeling off balance and slightly confused.  She had some slurred speech and difficulty with balance and coordination.  Due to her symptoms that were similar to her previous stroke, she presented to the hospital for evaluation.  MRI here shows 2 small strokes.  Neurology was consulted.     Assessment & Plan:   Principal Problem:   Acute stroke due to ischemia [facility]) Active Problems:   Hypothyroidism following radioiodine therapy   CAD (coronary artery disease)   Essential hypertension      Acute stroke due to ischemia -  -MRI-reviewed-acute new x 2 small vessel infarct morning upon/and subcortical white matter -per neurology dual antiplatelet therapy aspirin /Plavix  - Lipid profile, LDL 55, (goal  LDL > 70_ - A1c:  - Permissive hypertension - Consulting PT/OT/speech  Hypertension Permissive hypertension -holding BP meds  Diabetes type II Check hemoglobin A1c tomorrow Continue home regimen, SSI    Coronary artery disease with history of NSTEMI Continue Crestor  + ASA + Plavix    Hypothyroidism TSH:   ?  UTI, UA negative for any leukocyte esterase, positive for nitrites, rare bacteria, WBC 0-4 Not complaining of any dysuria, with holding biotics use

## 2024-06-14 ENCOUNTER — Encounter: Payer: Self-pay | Admitting: Internal Medicine

## 2024-06-14 ENCOUNTER — Ambulatory Visit (INDEPENDENT_AMBULATORY_CARE_PROVIDER_SITE_OTHER): Payer: Self-pay | Admitting: Internal Medicine

## 2024-06-14 VITALS — BP 138/74 | HR 50 | Ht 62.0 in | Wt 191.6 lb

## 2024-06-14 DIAGNOSIS — Z8673 Personal history of transient ischemic attack (TIA), and cerebral infarction without residual deficits: Secondary | ICD-10-CM | POA: Diagnosis not present

## 2024-06-14 DIAGNOSIS — E890 Postprocedural hypothyroidism: Secondary | ICD-10-CM

## 2024-06-14 DIAGNOSIS — E118 Type 2 diabetes mellitus with unspecified complications: Secondary | ICD-10-CM | POA: Diagnosis not present

## 2024-06-14 DIAGNOSIS — Z7984 Long term (current) use of oral hypoglycemic drugs: Secondary | ICD-10-CM

## 2024-06-14 DIAGNOSIS — Z1231 Encounter for screening mammogram for malignant neoplasm of breast: Secondary | ICD-10-CM

## 2024-06-14 DIAGNOSIS — Z1211 Encounter for screening for malignant neoplasm of colon: Secondary | ICD-10-CM | POA: Insufficient documentation

## 2024-06-14 DIAGNOSIS — Z09 Encounter for follow-up examination after completed treatment for conditions other than malignant neoplasm: Secondary | ICD-10-CM

## 2024-06-14 DIAGNOSIS — I10 Essential (primary) hypertension: Secondary | ICD-10-CM | POA: Diagnosis not present

## 2024-06-14 DIAGNOSIS — F4321 Adjustment disorder with depressed mood: Secondary | ICD-10-CM

## 2024-06-14 MED ORDER — LISINOPRIL 5 MG PO TABS: 5.0000 mg | ORAL_TABLET | Freq: Every day | ORAL | 3 refills | Status: DC

## 2024-06-14 MED ORDER — TRAZODONE HCL 50 MG PO TABS: 25.0000 mg | ORAL_TABLET | Freq: Every evening | ORAL | 3 refills | Status: AC | PRN

## 2024-06-14 NOTE — Assessment & Plan Note (Signed)
H/o Graves disease, s/p radio iodine ablation On levothyroxine 100 mcg QD, was taking it inconsistently, now takes it regularly Followed by Dr. Dorris Fetch - but has lost follow-up Check TSH and free T4

## 2024-06-14 NOTE — Assessment & Plan Note (Signed)
 BP Readings from Last 1 Encounters:  06/14/24 128/74   Uncontrolled due to noncompliance On metoprolol  50 mg QD, but has not been taking it Lisinopril  was recently discontinued during recent hospitalization Counseled for compliance with the medications Advised DASH diet and moderate exercise/walking, at least 150 mins/week

## 2024-06-14 NOTE — Progress Notes (Signed)
 Established Patient Office Visit  Subjective:  Patient ID: Linda Lin, female    DOB: 1971-07-03  Age: 53 y.o. MRN: [id number]  CC:  Chief Complaint  Patient presents with   Follow-up    Follow up from hospital.     HPI Linda Lin is a 53 y.o. female with past medical history of CAD, HTN, asthma, hypothyroidism and tobacco abuse who presents for follow-up after recent hospitalization from 05/28/24-05/29/24.  Patient presented to ER with waking up around 6 AM feeling off balance and slightly confused. She had some slurred speech and difficulty with balance and coordination. Due to her symptoms that were similar to her previous stroke, she presented to the hospital for evaluation. MRI here shows 2 small strokes. Neurology was consulted.  She was placed on DAPT and Crestor  was continued.  Today, she denies any focal neurologic complaints, including numbness or tingling of the UE or LE.  Speech appears to be at baseline, denies any episode of confusion since being discharged from the hospital.   She has been feeling low, lack of concentration and insomnia for the last few weeks due to loss of her job.  She has adequate family support, lives at her home with her sister currently.  Denies any SI or HI currently.  HTN: Her BP was borderline elevated today. She has been taking metoprolol .  Her lisinopril  was held upon discharge recently.  She denies any headache, dizziness, chest pain, dyspnea or palpitations.  Type II DM: She has been taking metformin  regularly.  She does not check her blood glucose.  Denies any polyuria or polyphagia currently.  Hypothyroidism: She has history of radioactive iodine therapy for Graves' disease.  She has been taking levothyroxine  on a regular basis now.  She has history of very high TSH levels, but has not followed up with endocrinology recently.   Past Medical History:  Diagnosis Date   Asthma    Cellulitis    Diabetes mellitus without  complication (HCC)    Heart attack (HCC) 2021   Scabies    Thyroid  disorder     Past Surgical History:  Procedure Laterality Date   CARPOMETACARPEL SUSPENSION PLASTY Right 10/16/2022   Procedure: THUMB TRAPEZIECTOMY WITH SUSPENSION PLASTY;  Surgeon: Romona Harari, MD;  Location: [facility];  Service: Orthopedics;  Laterality: Right;   LEFT HEART CATH AND CORONARY ANGIOGRAPHY N/A 06/09/2020   Procedure: LEFT HEART CATH AND CORONARY ANGIOGRAPHY;  Surgeon: Darron Deatrice LABOR, MD;  Location: [facility];  Service: Cardiovascular;  Laterality: N/A;    Family History  Problem Relation Age of Onset   Asthma Mother    Diabetes Father    Kidney failure Father    Hypertension Sister    CAD Maternal Grandmother    Breast cancer Cousin    Diabetes Mellitus II Brother    Hypertension Brother    Cancer Neg Hx     Social History   Socioeconomic History   Marital status: Single    Spouse name: Not on file   Number of children: Not on file   Years of education: Not on file   Highest education level: Not on file  Occupational History   Occupation: Landscape architect: [employer]  Tobacco Use   Smoking status: Every Day    Current packs/day: 0.25    Average packs/day: 0.3 packs/day for 36.7 years (9.2 ttl pk-yrs)    Types: Cigarettes    Start date: 10/14/1987  Smokeless tobacco: Never  Vaping Use   Vaping status: Never Used  Substance and Sexual Activity   Alcohol use: Yes    Comment: occ   Drug use: Not Currently    Types: Marijuana    Comment: sporadically-last used sept 2019   Sexual activity: Not Currently    Partners: Male    Birth control/protection: None  Other Topics Concern   Not on file  Social History Narrative   Pt lives w/ her sister.   Supervisor.    Social Drivers of Corporate investment banker Strain: Not on file  Food Insecurity: No Food Insecurity (05/28/2024)   Hunger Vital Sign    Worried About Running Out of Food  in the Last Year: Never true    Ran Out of Food in the Last Year: Never true  Transportation Needs: No Transportation Needs (05/28/2024)   PRAPARE - Administrator, Civil Service (Medical): No    Lack of Transportation (Non-Medical): No  Physical Activity: Not on file  Stress: Not on file  Social Connections: Not on file  Intimate Partner Violence: Not At Risk (05/28/2024)   Humiliation, Afraid, Rape, and Kick questionnaire    Fear of Current or Ex-Partner: No    Emotionally Abused: No    Physically Abused: No    Sexually Abused: No    Outpatient Medications Prior to Visit  Medication Sig Dispense Refill   acetaminophen  (TYLENOL ) 325 MG tablet Take 2 tablets (650 mg total) by mouth every 4 (four) hours as needed for mild pain (pain score 1-3) (or temp > 37.5 C (99.5 F)).     albuterol  (PROVENTIL ) (2.5 MG/3ML) 0.083% nebulizer solution Take 3 mLs (2.5 mg total) by nebulization every 4 (four) hours as needed for wheezing or shortness of breath. 75 mL 2   albuterol  (VENTOLIN  HFA) 108 (90 Base) MCG/ACT inhaler Inhale 2 puffs into the lungs every 6 (six) hours as needed for wheezing or shortness of breath. 8 g 2   aspirin  EC 81 MG tablet Take 1 tablet (81 mg total) by mouth daily. Swallow whole. 150 tablet 2   clopidogrel  (PLAVIX ) 75 MG tablet Take 1 tablet (75 mg total) by mouth daily. take Aspirin  81 mg daily along with Plavix  75 mg daily for 21 days then after that STOP the Aspirin  and continue ONLY Plavix  75 mg daily indefinitely-- 30 tablet 11   levothyroxine  (SYNTHROID ) 100 MCG tablet Take 1 tablet (100 mcg total) by mouth daily before breakfast. 90 tablet 3   metFORMIN  (GLUCOPHAGE ) 500 MG tablet Take 1 tablet (500 mg total) by mouth 2 (two) times daily with a meal. 180 tablet 3   metoprolol  succinate (TOPROL  XL) 50 MG 24 hr tablet Take 1 tablet (50 mg total) by mouth daily. Take with or immediately following a meal. 30 tablet 11   nicotine  (NICODERM CQ  - DOSED IN MG/24 HR) 7  mg/24hr patch Place 1 patch (7 mg total) onto the skin daily. 28 patch 0   pantoprazole  (PROTONIX ) 40 MG tablet Take 1 tablet (40 mg total) by mouth daily. 30 tablet 1   rosuvastatin  (CRESTOR ) 40 MG tablet Take 1 tablet (40 mg total) by mouth daily. 90 tablet 3   No facility-administered medications prior to visit.    Allergies  Allergen Reactions   Shellfish Allergy Hives   Cephalexin  Rash   Doxycycline  Rash    ROS Review of Systems  Constitutional:  Negative for chills and fever.  HENT:  Negative for congestion,  sinus pressure, sinus pain and sore throat.   Eyes:  Negative for pain and discharge.  Respiratory:  Negative for cough and shortness of breath.   Cardiovascular:  Negative for chest pain and palpitations.  Gastrointestinal:  Negative for abdominal pain, diarrhea, nausea and vomiting.  Endocrine: Negative for polydipsia and polyuria.  Genitourinary:  Negative for dysuria and hematuria.  Musculoskeletal:  Negative for neck pain and neck stiffness.  Skin:  Negative for rash.  Neurological:  Negative for dizziness and weakness.  Psychiatric/Behavioral:  Positive for sleep disturbance. Negative for agitation and behavioral problems. The patient is nervous/anxious.       Objective:    Physical Exam Vitals reviewed.  Constitutional:      General: She is not in acute distress.    Appearance: She is not diaphoretic.  HENT:     Head: Normocephalic and atraumatic.     Nose: Nose normal.     Mouth/Throat:     Mouth: Mucous membranes are moist.  Eyes:     General: No scleral icterus.    Extraocular Movements: Extraocular movements intact.  Cardiovascular:     Rate and Rhythm: Normal rate and regular rhythm.     Heart sounds: Normal heart sounds. No murmur heard. Pulmonary:     Breath sounds: Normal breath sounds. No wheezing or rales.  Musculoskeletal:     Cervical back: Neck supple. No tenderness.     Right lower leg: No edema.     Left lower leg: No edema.   Skin:    General: Skin is warm.     Findings: No rash.  Neurological:     General: No focal deficit present.     Mental Status: She is alert and oriented to person, place, and time.     Sensory: No sensory deficit.     Motor: No weakness.  Psychiatric:        Mood and Affect: Mood normal.        Behavior: Behavior normal.     BP 138/74 (BP Location: Left Arm)   Pulse (!) 50   Ht 5' 2 (1.575 m)   Wt 191 lb 9.6 oz (86.9 kg)   LMP 05/08/2020   SpO2 99%   BMI 35.04 kg/m  Wt Readings from Last 3 Encounters:  06/14/24 191 lb 9.6 oz (86.9 kg)  05/28/24 186 lb 1.1 oz (84.4 kg)  02/06/24 188 lb 8 oz (85.5 kg)    Lab Results  Component Value Date   TSH 13.600 (H) 11/13/2022   Lab Results  Component Value Date   WBC 7.1 05/28/2024   HGB 13.9 05/28/2024   HCT 41.8 05/28/2024   MCV 100.2 (H) 05/28/2024   PLT 266 05/28/2024   Lab Results  Component Value Date   NA 137 05/28/2024   K 3.9 05/28/2024   CO2 25 05/28/2024   GLUCOSE 98 05/28/2024   BUN 13 05/28/2024   CREATININE 0.90 05/28/2024   BILITOT 0.5 05/28/2024   ALKPHOS 92 05/28/2024   AST 18 05/28/2024   ALT 15 05/28/2024   PROT 7.7 05/28/2024   ALBUMIN 3.5 05/28/2024   CALCIUM  9.7 05/28/2024   ANIONGAP 10 05/28/2024   EGFR 76 11/13/2022   Lab Results  Component Value Date   CHOL 121 05/29/2024   Lab Results  Component Value Date   HDL 53 05/29/2024   Lab Results  Component Value Date   LDLCALC 55 05/29/2024   Lab Results  Component Value Date   TRIG 65 05/29/2024  Lab Results  Component Value Date   CHOLHDL 2.3 05/29/2024   Lab Results  Component Value Date   HGBA1C 6.7 (H) 02/07/2024      Assessment & Plan:   Problem List Items Addressed This Visit       Cardiovascular and Mediastinum   Essential hypertension   BP Readings from Last 1 Encounters:  06/14/24 128/74   Uncontrolled due to noncompliance On metoprolol  50 mg QD, but has not been taking it Lisinopril  was recently  discontinued during recent hospitalization Counseled for compliance with the medications Advised DASH diet and moderate exercise/walking, at least 150 mins/week      Relevant Medications   lisinopril  (ZESTRIL ) 5 MG tablet   Other Relevant Orders   CBC with Differential/Platelet   CMP14+EGFR     Endocrine   Hypothyroidism following radioiodine therapy   H/o Graves disease, s/p radio iodine ablation On levothyroxine  100 mcg QD, was taking it inconsistently, now takes it regularly Followed by Dr. Lenis - but has lost follow-up Check TSH and free T4      Relevant Orders   TSH + free T4   Type 2 diabetes mellitus with complication, without long-term current use of insulin  (HCC)   Lab Results  Component Value Date   HGBA1C 6.7 (H) 02/07/2024   Well controlled Associated with HTN, HLD and CAD On Metformin  500 mg BID Advised to follow diabetic diet On statin and ACEi F/u CMP and lipid panel Diabetic eye exam: Advised to follow up with Ophthalmology for diabetic eye exam      Relevant Medications   lisinopril  (ZESTRIL ) 5 MG tablet   Other Relevant Orders   Microalbumin / creatinine urine ratio   CMP14+EGFR   Hemoglobin A1c     Other   History of CVA (cerebrovascular accident) - Primary   Had CVA in 05/25 and 09/25 Had neurology evaluation during hospitalization - advised to continue DAPT for 3 weeks and then continue Plavix  Continue Crestor  No focal neurologic deficit currently      Hospital discharge follow-up   Hospital chart reviewed, including discharge summary Medications reconciled and reviewed with the patient in detail      Adjustment disorder with depressed mood   Her current mood symptoms are likely due to loss of job Also complains of insomnia Started trazodone  for insomnia, can also help with anxiety If persistent, can discuss about SSRI      Relevant Medications   traZODone  (DESYREL ) 50 MG tablet   Colon cancer screening   Discussed about  colonoscopy and cologuard - benefits of each procedure discussed. Patient prefers Cologuard - ordered.      Relevant Orders   Cologuard   Other Visit Diagnoses       Breast cancer screening by mammogram       Relevant Orders   MM 3D SCREENING MAMMOGRAM BILATERAL BREAST       Meds ordered this encounter  Medications   lisinopril  (ZESTRIL ) 5 MG tablet    Sig: Take 1 tablet (5 mg total) by mouth daily.    Dispense:  90 tablet    Refill:  3    Dose change - 06/14/24   traZODone  (DESYREL ) 50 MG tablet    Sig: Take 0.5-1 tablets (25-50 mg total) by mouth at bedtime as needed for sleep.    Dispense:  30 tablet    Refill:  3    Follow-up: Return in about 3 months (around 09/13/2024) for DM and HTN.    Layana Konkel K  Tobie, MD

## 2024-06-14 NOTE — Assessment & Plan Note (Signed)
 Her current mood symptoms are likely due to loss of job Also complains of insomnia Started trazodone  for insomnia, can also help with anxiety If persistent, can discuss about SSRI

## 2024-06-14 NOTE — Assessment & Plan Note (Signed)
 Hospital chart reviewed, including discharge summary Medications reconciled and reviewed with the patient in detail

## 2024-06-14 NOTE — Patient Instructions (Addendum)
 Please schedule Mammogram.  Please start taking Lisinopril  5 mg once daily.  Please stop Aspirin  after 1 week. Please continue taking Plavix  and Rosuvastatin  regularly.  Please take Trazodone  as needed for insomnia.  Please continue to take other medications as prescribed.  Please continue to follow low carb diet and perform moderate exercise/walking at least 150 mins/week.  Please maintain simple sleep hygiene. - Maintain dark and non-noisy environment in the bedroom. - Please use the bedroom for sleep and sexual activity only. - Do not use electronic devices in the bedroom. - Please take dinner at least 2 hours before bedtime. - Please avoid caffeinated products in the evening, including coffee, soft drinks. - Please try to maintain the regular sleep-wake cycle - Go to bed and wake up at the same time.

## 2024-06-14 NOTE — Assessment & Plan Note (Signed)
 Discussed about colonoscopy and cologuard - benefits of each procedure discussed. Patient prefers Cologuard - ordered.

## 2024-06-14 NOTE — Assessment & Plan Note (Addendum)
 Lab Results  Component Value Date   HGBA1C 6.7 (H) 02/07/2024   Well controlled Associated with HTN, HLD and CAD On Metformin  500 mg BID Advised to follow diabetic diet On statin and ACEi F/u CMP and lipid panel Diabetic eye exam: Advised to follow up with Ophthalmology for diabetic eye exam

## 2024-06-14 NOTE — Assessment & Plan Note (Signed)
 Had CVA in 05/25 and 09/25 Had neurology evaluation during hospitalization - advised to continue DAPT for 3 weeks and then continue Plavix  Continue Crestor  No focal neurologic deficit currently

## 2024-06-15 ENCOUNTER — Ambulatory Visit: Payer: Self-pay | Admitting: Internal Medicine

## 2024-06-15 ENCOUNTER — Other Ambulatory Visit: Payer: Self-pay | Admitting: Internal Medicine

## 2024-06-15 DIAGNOSIS — E890 Postprocedural hypothyroidism: Secondary | ICD-10-CM

## 2024-06-15 LAB — CMP14+EGFR
ALT: 12 IU/L (ref 0–32)
AST: 21 IU/L (ref 0–40)
Albumin: 4.2 g/dL (ref 3.8–4.9)
Alkaline Phosphatase: 100 IU/L (ref 49–135)
BUN/Creatinine Ratio: 11 (ref 9–23)
BUN: 12 mg/dL (ref 6–24)
Bilirubin Total: 0.3 mg/dL (ref 0.0–1.2)
CO2: 26 mmol/L (ref 20–29)
Calcium: 9.5 mg/dL (ref 8.7–10.2)
Chloride: 100 mmol/L (ref 96–106)
Creatinine, Ser: 1.07 mg/dL — ABNORMAL HIGH (ref 0.57–1.00)
Globulin, Total: 3.2 g/dL (ref 1.5–4.5)
Glucose: 103 mg/dL — ABNORMAL HIGH (ref 70–99)
Potassium: 4.4 mmol/L (ref 3.5–5.2)
Sodium: 137 mmol/L (ref 134–144)
Total Protein: 7.4 g/dL (ref 6.0–8.5)
eGFR: 62 mL/min/1.73 (ref >59)

## 2024-06-15 LAB — CBC WITH DIFFERENTIAL/PLATELET
Basophils Absolute: 0.1 x10E3/uL (ref 0.0–0.2)
Basos: 1 %
EOS (ABSOLUTE): 0.3 x10E3/uL (ref 0.0–0.4)
Eos: 5 %
Hematocrit: 41.5 % (ref 34.0–46.6)
Hemoglobin: 13.1 g/dL (ref 11.1–15.9)
Immature Grans (Abs): 0 x10E3/uL (ref 0.0–0.1)
Immature Granulocytes: 0 %
Lymphocytes Absolute: 2.4 x10E3/uL (ref 0.7–3.1)
Lymphs: 40 %
MCH: 32.3 pg (ref 26.6–33.0)
MCHC: 31.6 g/dL (ref 31.5–35.7)
MCV: 102 fL — ABNORMAL HIGH (ref 79–97)
Monocytes Absolute: 0.5 x10E3/uL (ref 0.1–0.9)
Monocytes: 9 %
Neutrophils Absolute: 2.7 x10E3/uL (ref 1.4–7.0)
Neutrophils: 45 %
Platelets: 230 x10E3/uL (ref 150–450)
RBC: 4.06 x10E6/uL (ref 3.77–5.28)
RDW: 12.6 % (ref 11.7–15.4)
WBC: 5.9 x10E3/uL (ref 3.4–10.8)

## 2024-06-15 LAB — HEMOGLOBIN A1C
Est. average glucose Bld gHb Est-mCnc: 157 mg/dL
Hgb A1c MFr Bld: 7.1 % — ABNORMAL HIGH (ref 4.8–5.6)

## 2024-06-15 LAB — TSH+FREE T4
Free T4: 0.66 ng/dL — ABNORMAL LOW (ref 0.82–1.77)
TSH: 32.9 u[IU]/mL — ABNORMAL HIGH (ref 0.450–4.500)

## 2024-06-15 MED ORDER — LEVOTHYROXINE SODIUM 125 MCG PO TABS: 125.0000 ug | ORAL_TABLET | Freq: Every day | ORAL | 1 refills | Status: AC

## 2024-06-16 LAB — MICROALBUMIN / CREATININE URINE RATIO
Creatinine, Urine: 229.9 mg/dL
Microalb/Creat Ratio: 44 mg/g{creat} — ABNORMAL HIGH (ref 0–29)
Microalbumin, Urine: 102.1 ug/mL

## 2024-06-28 ENCOUNTER — Ambulatory Visit (HOSPITAL_COMMUNITY)
Admission: RE | Admit: 2024-06-28 | Discharge: 2024-06-28 | Disposition: A | Discharge status: Home / Self Care | Payer: Self-pay | Source: Ambulatory Visit | Attending: Internal Medicine | Admitting: Internal Medicine

## 2024-06-28 ENCOUNTER — Telehealth: Payer: Self-pay

## 2024-06-28 ENCOUNTER — Telehealth: Payer: Self-pay | Admitting: Internal Medicine

## 2024-06-28 DIAGNOSIS — Z1231 Encounter for screening mammogram for malignant neoplasm of breast: Secondary | ICD-10-CM | POA: Insufficient documentation

## 2024-06-28 NOTE — Telephone Encounter (Signed)
 Patient dropped off document Disability form , to be filled out by provider. Patient requested to send it back via Call Patient to pick up within 7-days. Document is located in providers tray at front office.Please advise at Mobile [phone] (mobile)

## 2024-06-28 NOTE — Telephone Encounter (Signed)
 error

## 2024-09-17 ENCOUNTER — Encounter: Payer: Self-pay | Admitting: Internal Medicine

## 2024-09-17 ENCOUNTER — Ambulatory Visit (INDEPENDENT_AMBULATORY_CARE_PROVIDER_SITE_OTHER): Payer: Self-pay | Admitting: Internal Medicine

## 2024-09-17 VITALS — BP 144/84 | Ht 62.0 in | Wt 188.0 lb

## 2024-09-17 DIAGNOSIS — Z7984 Long term (current) use of oral hypoglycemic drugs: Secondary | ICD-10-CM

## 2024-09-17 DIAGNOSIS — E118 Type 2 diabetes mellitus with unspecified complications: Secondary | ICD-10-CM

## 2024-09-17 DIAGNOSIS — Z8673 Personal history of transient ischemic attack (TIA), and cerebral infarction without residual deficits: Secondary | ICD-10-CM | POA: Diagnosis not present

## 2024-09-17 DIAGNOSIS — E782 Mixed hyperlipidemia: Secondary | ICD-10-CM | POA: Diagnosis not present

## 2024-09-17 DIAGNOSIS — I2510 Atherosclerotic heart disease of native coronary artery without angina pectoris: Secondary | ICD-10-CM

## 2024-09-17 DIAGNOSIS — F4321 Adjustment disorder with depressed mood: Secondary | ICD-10-CM | POA: Diagnosis not present

## 2024-09-17 DIAGNOSIS — E890 Postprocedural hypothyroidism: Secondary | ICD-10-CM

## 2024-09-17 DIAGNOSIS — J4530 Mild persistent asthma, uncomplicated: Secondary | ICD-10-CM

## 2024-09-17 DIAGNOSIS — I10 Essential (primary) hypertension: Secondary | ICD-10-CM | POA: Diagnosis not present

## 2024-09-17 MED ORDER — METOPROLOL SUCCINATE ER 50 MG PO TB24: 50.0000 mg | ORAL_TABLET | Freq: Every day | ORAL | 11 refills | Status: AC

## 2024-09-17 MED ORDER — ROSUVASTATIN CALCIUM 40 MG PO TABS: 40.0000 mg | ORAL_TABLET | Freq: Every day | ORAL | 3 refills | Status: AC

## 2024-09-17 MED ORDER — CLOPIDOGREL BISULFATE 75 MG PO TABS: 75.0000 mg | ORAL_TABLET | Freq: Every day | ORAL | 11 refills | Status: AC

## 2024-09-17 MED ORDER — METFORMIN HCL 500 MG PO TABS: 500.0000 mg | ORAL_TABLET | Freq: Two times a day (BID) | ORAL | 3 refills | Status: AC

## 2024-09-17 MED ORDER — ALBUTEROL SULFATE HFA 108 (90 BASE) MCG/ACT IN AERS: 2.0000 | INHALATION_SPRAY | Freq: Four times a day (QID) | RESPIRATORY_TRACT | 2 refills | Status: AC | PRN

## 2024-09-17 MED ORDER — LISINOPRIL 10 MG PO TABS: 10.0000 mg | ORAL_TABLET | Freq: Every day | ORAL | 3 refills | Status: AC

## 2024-09-17 NOTE — Assessment & Plan Note (Addendum)
 BP Readings from Last 1 Encounters:  09/17/24 (!) 144/84   Uncontrolled On lisinopril  5 mg QD and metoprolol  50 mg QD Increased dose of lisinopril  to 10 mg QD Counseled for compliance with the medications Advised DASH diet and moderate exercise/walking, at least 150 mins/week

## 2024-09-17 NOTE — Assessment & Plan Note (Signed)
 H/o NSTEMI, managed medically On aspirin  and statin On beta-blocker - Metoprolol  50 mg QD, but has not seen cardiology for a long time Followed by cardiology - needs to schedule appointment

## 2024-09-17 NOTE — Assessment & Plan Note (Addendum)
 Had CVA in 05/25 and 09/25 Had neurology evaluation during hospitalization - advised to continue Plavix  only Continue Crestor  Has right sided LE weakness leading to fall - referred to PT and Neurology

## 2024-09-17 NOTE — Assessment & Plan Note (Signed)
 Lab Results  Component Value Date   HGBA1C 7.1 (H) 06/14/2024   Well controlled overall Associated with HTN, HLD and CAD On Metformin  500 mg BID, needs to be compliant Advised to follow diabetic diet On statin and ACEi F/u CMP and lipid panel Diabetic eye exam: Advised to follow up with Ophthalmology for diabetic eye exam

## 2024-09-17 NOTE — Assessment & Plan Note (Signed)
 Her current mood symptoms are likely due to loss of job Also complains of insomnia Continue trazodone  for insomnia, can also help with anxiety If persistent, can discuss about SSRI - but she prefers to avoid for now [facility] therapy, but she prefers to wait for now

## 2024-09-17 NOTE — Assessment & Plan Note (Signed)
 H/o Graves disease, s/p radio iodine ablation On levothyroxine  125 mcg QD, was taking it inconsistently, now takes it regularly Used to be followed by Dr. Lenis - but has lost follow-up Check TSH and free T4

## 2024-09-17 NOTE — Progress Notes (Signed)
 "  Established Patient Office Visit  Subjective:  Patient ID: Linda Lin, female    DOB: Oct 26, 1970  Age: 53 y.o. MRN: [id number]  CC:  Chief Complaint  Patient presents with   Diabetes    3 month f/u    Hypertension    3 month f/u     HPI [name] is a 53 y.o. female with past medical history of CAD, HTN, asthma, hypothyroidism and tobacco abuse who presents for follow-up of her chronic medical conditions.  History of CVA: She was admitted at [facility] in 09/25.  She has been taking Plavix  and statin since then.  She has noticed RLE weakness, leading to fall in the last week.  Denies any knee or hip pain currently.  She has been feeling low, lack of concentration and insomnia for the last 4 months due to loss of her job.  She has tried taking trazodone , which helps with insomnia, but prefers to avoid taking it daily.  She has adequate family support, lives at her home with her sister currently.  Denies any SI or HI currently.  HTN: Her BP was borderline elevated today. She has been taking lisinopril  and metoprolol . She denies any headache, dizziness, chest pain, dyspnea or palpitations.  Type II DM: Her last HbA1c was 7.1 in 09/25. She has been taking metformin  500 mg BID, but compliance is questionable.  She does not check her blood glucose.  Denies any polyuria or polyphagia currently.  Hypothyroidism: She has history of radioactive iodine therapy for Graves' disease.  She has been taking levothyroxine  125 mcg QD on a regular basis now.  She has history of very high TSH levels, but has not followed up with endocrinology recently.   Past Medical History:  Diagnosis Date   Asthma    Cellulitis    Diabetes mellitus without complication (HCC)    Heart attack (HCC) 2021   Scabies    Thyroid  disorder     Past Surgical History:  Procedure Laterality Date   CARPOMETACARPEL SUSPENSION PLASTY Right 10/16/2022   Procedure: THUMB TRAPEZIECTOMY WITH SUSPENSION  PLASTY;  Surgeon: Romona Harari, MD;  Location: [facility];  Service: Orthopedics;  Laterality: Right;   LEFT HEART CATH AND CORONARY ANGIOGRAPHY N/A 06/09/2020   Procedure: LEFT HEART CATH AND CORONARY ANGIOGRAPHY;  Surgeon: Darron Deatrice LABOR, MD;  Location: [facility];  Service: Cardiovascular;  Laterality: N/A;    Family History  Problem Relation Age of Onset   Asthma Mother    Diabetes Father    Kidney failure Father    Hypertension Sister    CAD Maternal Grandmother    Breast cancer Cousin    Diabetes Mellitus II Brother    Hypertension Brother    Cancer Neg Hx     Social History   Socioeconomic History   Marital status: Single    Spouse name: Not on file   Number of children: Not on file   Years of education: Not on file   Highest education level: Not on file  Occupational History   Occupation: Landscape Architect: [employer]  Tobacco Use   Smoking status: Every Day    Current packs/day: 0.25    Average packs/day: 0.3 packs/day for 36.9 years (9.2 ttl pk-yrs)    Types: Cigarettes    Start date: 10/14/1987   Smokeless tobacco: Never  Vaping Use   Vaping status: Never Used  Substance and Sexual Activity  Alcohol use: Yes    Comment: occ   Drug use: Not Currently    Types: Marijuana    Comment: sporadically-last used sept 2019   Sexual activity: Not Currently    Partners: Male    Birth control/protection: None  Other Topics Concern   Not on file  Social History Narrative   Pt lives w/ her sister.   Supervisor.    Social Drivers of Health   Tobacco Use: High Risk (09/17/2024)   Patient History    Smoking Tobacco Use: Every Day    Smokeless Tobacco Use: Never    Passive Exposure: Not on file  Financial Resource Strain: Not on file  Food Insecurity: No Food Insecurity (05/28/2024)   Epic    Worried About Programme Researcher, Broadcasting/film/video in the Last Year: Never true    Ran Out of Food in the Last Year: Never true  Transportation  Needs: No Transportation Needs (05/28/2024)   Epic    Lack of Transportation (Medical): No    Lack of Transportation (Non-Medical): No  Physical Activity: Not on file  Stress: Not on file  Social Connections: Not on file  Intimate Partner Violence: Not At Risk (05/28/2024)   Epic    Fear of Current or Ex-Partner: No    Emotionally Abused: No    Physically Abused: No    Sexually Abused: No  Depression (PHQ2-9): High Risk (06/14/2024)   Depression (PHQ2-9)    PHQ-2 Score: 13  Alcohol Screen: Not on file  Housing: Low Risk (05/28/2024)   Epic    Unable to Pay for Housing in the Last Year: No    Number of Times Moved in the Last Year: 0    Homeless in the Last Year: No  Utilities: Not At Risk (05/28/2024)   Epic    Threatened with loss of utilities: No  Health Literacy: Not on file    Outpatient Medications Prior to Visit  Medication Sig Dispense Refill   acetaminophen  (TYLENOL ) 325 MG tablet Take 2 tablets (650 mg total) by mouth every 4 (four) hours as needed for mild pain (pain score 1-3) (or temp > 37.5 C (99.5 F)).     albuterol  (PROVENTIL ) (2.5 MG/3ML) 0.083% nebulizer solution Take 3 mLs (2.5 mg total) by nebulization every 4 (four) hours as needed for wheezing or shortness of breath. 75 mL 2   aspirin  EC 81 MG tablet Take 1 tablet (81 mg total) by mouth daily. Swallow whole. 150 tablet 2   levothyroxine  (SYNTHROID ) 125 MCG tablet Take 1 tablet (125 mcg total) by mouth daily before breakfast. 90 tablet 1   nicotine  (NICODERM CQ  - DOSED IN MG/24 HR) 7 mg/24hr patch Place 1 patch (7 mg total) onto the skin daily. 28 patch 0   pantoprazole  (PROTONIX ) 40 MG tablet Take 1 tablet (40 mg total) by mouth daily. 30 tablet 1   traZODone  (DESYREL ) 50 MG tablet Take 0.5-1 tablets (25-50 mg total) by mouth at bedtime as needed for sleep. 30 tablet 3   albuterol  (VENTOLIN  HFA) 108 (90 Base) MCG/ACT inhaler Inhale 2 puffs into the lungs every 6 (six) hours as needed for wheezing or shortness of  breath. 8 g 2   clopidogrel  (PLAVIX ) 75 MG tablet Take 1 tablet (75 mg total) by mouth daily. take Aspirin  81 mg daily along with Plavix  75 mg daily for 21 days then after that STOP the Aspirin  and continue ONLY Plavix  75 mg daily indefinitely-- 30 tablet 11   lisinopril  (ZESTRIL ) 5 MG  tablet Take 1 tablet (5 mg total) by mouth daily. 90 tablet 3   metFORMIN  (GLUCOPHAGE ) 500 MG tablet Take 1 tablet (500 mg total) by mouth 2 (two) times daily with a meal. 180 tablet 3   metoprolol  succinate (TOPROL  XL) 50 MG 24 hr tablet Take 1 tablet (50 mg total) by mouth daily. Take with or immediately following a meal. 30 tablet 11   rosuvastatin  (CRESTOR ) 40 MG tablet Take 1 tablet (40 mg total) by mouth daily. 90 tablet 3   No facility-administered medications prior to visit.    Allergies  Allergen Reactions   Shellfish Allergy Hives   Cephalexin  Rash   Doxycycline  Rash    ROS Review of Systems  Constitutional:  Negative for chills and fever.  HENT:  Negative for congestion, sinus pressure, sinus pain and sore throat.   Eyes:  Negative for pain and discharge.  Respiratory:  Negative for cough and shortness of breath.   Cardiovascular:  Negative for chest pain and palpitations.  Gastrointestinal:  Negative for abdominal pain, diarrhea, nausea and vomiting.  Endocrine: Negative for polydipsia and polyuria.  Genitourinary:  Negative for dysuria and hematuria.  Musculoskeletal:  Negative for neck pain and neck stiffness.  Skin:  Negative for rash.  Neurological:  Positive for weakness. Negative for dizziness.  Psychiatric/Behavioral:  Positive for sleep disturbance. Negative for agitation and behavioral problems. The patient is nervous/anxious.       Objective:    Physical Exam Vitals reviewed.  Constitutional:      General: She is not in acute distress.    Appearance: She is not diaphoretic.  HENT:     Head: Normocephalic and atraumatic.     Nose: Nose normal.     Mouth/Throat:      Mouth: Mucous membranes are moist.  Eyes:     General: No scleral icterus.    Extraocular Movements: Extraocular movements intact.  Cardiovascular:     Rate and Rhythm: Normal rate and regular rhythm.     Heart sounds: Normal heart sounds. No murmur heard. Pulmonary:     Breath sounds: Normal breath sounds. No wheezing or rales.  Musculoskeletal:     Cervical back: Neck supple. No tenderness.     Right lower leg: No edema.     Left lower leg: No edema.  Skin:    General: Skin is warm.     Findings: No rash.  Neurological:     General: No focal deficit present.     Mental Status: She is alert and oriented to person, place, and time.     Sensory: No sensory deficit.     Motor: Weakness (RLE - 4/5) present.  Psychiatric:        Mood and Affect: Mood is depressed.        Behavior: Behavior is cooperative.     BP (!) 144/84 (BP Location: Left Arm)   Ht 5' 2 (1.575 m)   LMP 05/08/2020   BMI 35.04 kg/m  Wt Readings from Last 3 Encounters:  06/14/24 191 lb 9.6 oz (86.9 kg)  05/28/24 186 lb 1.1 oz (84.4 kg)  02/06/24 188 lb 8 oz (85.5 kg)    Lab Results  Component Value Date   TSH 32.900 (H) 06/14/2024   Lab Results  Component Value Date   WBC 5.9 06/14/2024   HGB 13.1 06/14/2024   HCT 41.5 06/14/2024   MCV 102 (H) 06/14/2024   PLT 230 06/14/2024   Lab Results  Component Value Date  NA 137 06/14/2024   K 4.4 06/14/2024   CO2 26 06/14/2024   GLUCOSE 103 (H) 06/14/2024   BUN 12 06/14/2024   CREATININE 1.07 (H) 06/14/2024   BILITOT 0.3 06/14/2024   ALKPHOS 100 06/14/2024   AST 21 06/14/2024   ALT 12 06/14/2024   PROT 7.4 06/14/2024   ALBUMIN 4.2 06/14/2024   CALCIUM  9.5 06/14/2024   ANIONGAP 10 05/28/2024   EGFR 62 06/14/2024   Lab Results  Component Value Date   CHOL 121 05/29/2024   Lab Results  Component Value Date   HDL 53 05/29/2024   Lab Results  Component Value Date   LDLCALC 55 05/29/2024   Lab Results  Component Value Date   TRIG 65  05/29/2024   Lab Results  Component Value Date   CHOLHDL 2.3 05/29/2024   Lab Results  Component Value Date   HGBA1C 7.1 (H) 06/14/2024      Assessment & Plan:   Problem List Items Addressed This Visit       Cardiovascular and Mediastinum   CAD (coronary artery disease)   H/o NSTEMI, managed medically On aspirin  and statin On beta-blocker - Metoprolol  50 mg QD, but has not seen cardiology for a long time Followed by cardiology - needs to schedule appointment      Relevant Medications   lisinopril  (ZESTRIL ) 10 MG tablet   metoprolol  succinate (TOPROL  XL) 50 MG 24 hr tablet   rosuvastatin  (CRESTOR ) 40 MG tablet   Essential hypertension - Primary   BP Readings from Last 1 Encounters:  09/17/24 (!) 144/84   Uncontrolled On lisinopril  5 mg QD and metoprolol  50 mg QD Increased dose of lisinopril  to 10 mg QD Counseled for compliance with the medications Advised DASH diet and moderate exercise/walking, at least 150 mins/week      Relevant Medications   lisinopril  (ZESTRIL ) 10 MG tablet   metoprolol  succinate (TOPROL  XL) 50 MG 24 hr tablet   rosuvastatin  (CRESTOR ) 40 MG tablet     Respiratory   Mild persistent asthma without complication   Well controlled now with as needed albuterol       Relevant Medications   albuterol  (VENTOLIN  HFA) 108 (90 Base) MCG/ACT inhaler     Endocrine   Hypothyroidism following radioiodine therapy   H/o Graves disease, s/p radio iodine ablation On levothyroxine  125 mcg QD, was taking it inconsistently, now takes it regularly Used to be followed by Dr. Lenis - but has lost follow-up Check TSH and free T4      Relevant Medications   metoprolol  succinate (TOPROL  XL) 50 MG 24 hr tablet   Other Relevant Orders   TSH + free T4   Type 2 diabetes mellitus with complication, without long-term current use of insulin  (HCC)   Lab Results  Component Value Date   HGBA1C 7.1 (H) 06/14/2024   Well controlled overall Associated with HTN, HLD and  CAD On Metformin  500 mg BID, needs to be compliant Advised to follow diabetic diet On statin and ACEi F/u CMP and lipid panel Diabetic eye exam: Advised to follow up with Ophthalmology for diabetic eye exam      Relevant Medications   lisinopril  (ZESTRIL ) 10 MG tablet   metFORMIN  (GLUCOPHAGE ) 500 MG tablet   rosuvastatin  (CRESTOR ) 40 MG tablet   Other Relevant Orders   CMP14+EGFR   Hemoglobin A1c     Other   Hyperlipidemia   Relevant Medications   lisinopril  (ZESTRIL ) 10 MG tablet   metoprolol  succinate (TOPROL  XL) 50 MG  24 hr tablet   rosuvastatin  (CRESTOR ) 40 MG tablet   History of CVA (cerebrovascular accident)   Had CVA in 05/25 and 09/25 Had neurology evaluation during hospitalization - advised to continue Plavix  only Continue Crestor  Has right sided LE weakness leading to fall - referred to PT and Neurology      Relevant Medications   clopidogrel  (PLAVIX ) 75 MG tablet   Other Relevant Orders   Ambulatory referral to Neurology   Ambulatory referral to Physical Therapy   Adjustment disorder with depressed mood   Her current mood symptoms are likely due to loss of job Also complains of insomnia Continue trazodone  for insomnia, can also help with anxiety If persistent, can discuss about SSRI - but she prefers to avoid for now [facility] therapy, but she prefers to wait for now       Meds ordered this encounter  Medications   albuterol  (VENTOLIN  HFA) 108 (90 Base) MCG/ACT inhaler    Sig: Inhale 2 puffs into the lungs every 6 (six) hours as needed for wheezing or shortness of breath.    Dispense:  18 g    Refill:  2   lisinopril  (ZESTRIL ) 10 MG tablet    Sig: Take 1 tablet (10 mg total) by mouth daily.    Dispense:  90 tablet    Refill:  3    Dose change - 09/17/24   metFORMIN  (GLUCOPHAGE ) 500 MG tablet    Sig: Take 1 tablet (500 mg total) by mouth 2 (two) times daily with a meal.    Dispense:  180 tablet    Refill:  3   metoprolol  succinate (TOPROL  XL) 50  MG 24 hr tablet    Sig: Take 1 tablet (50 mg total) by mouth daily. Take with or immediately following a meal.    Dispense:  30 tablet    Refill:  11   clopidogrel  (PLAVIX ) 75 MG tablet    Sig: Take 1 tablet (75 mg total) by mouth daily. take Aspirin  81 mg daily along with Plavix  75 mg daily for 21 days then after that STOP the Aspirin  and continue ONLY Plavix  75 mg daily indefinitely--    Dispense:  30 tablet    Refill:  11   rosuvastatin  (CRESTOR ) 40 MG tablet    Sig: Take 1 tablet (40 mg total) by mouth daily.    Dispense:  90 tablet    Refill:  3    Follow-up: Return in about 3 months (around 12/16/2024).    Suzzane MARLA Blanch, MD "

## 2024-09-17 NOTE — Assessment & Plan Note (Signed)
 Well controlled now with as needed albuterol 

## 2024-09-17 NOTE — Patient Instructions (Signed)
 Please start taking Lisinopril  10 mg once daily instead of 5 mg.  Please continue to take medications as prescribed.  Please continue to follow low carb diet and perform moderate exercise/walking at least 150 mins/week.

## 2024-09-18 LAB — CMP14+EGFR
ALT: 8 IU/L (ref 0–32)
AST: 15 IU/L (ref 0–40)
Albumin: 4.4 g/dL (ref 3.8–4.9)
Alkaline Phosphatase: 113 IU/L (ref 49–135)
BUN/Creatinine Ratio: 23 (ref 9–23)
BUN: 22 mg/dL (ref 6–24)
Bilirubin Total: 0.2 mg/dL (ref 0.0–1.2)
CO2: 27 mmol/L (ref 20–29)
Calcium: 9.9 mg/dL (ref 8.7–10.2)
Chloride: 99 mmol/L (ref 96–106)
Creatinine, Ser: 0.94 mg/dL (ref 0.57–1.00)
Globulin, Total: 3.5 g/dL (ref 1.5–4.5)
Glucose: 90 mg/dL (ref 70–99)
Potassium: 4 mmol/L (ref 3.5–5.2)
Sodium: 140 mmol/L (ref 134–144)
Total Protein: 7.9 g/dL (ref 6.0–8.5)
eGFR: 72 mL/min/1.73

## 2024-09-18 LAB — TSH+FREE T4
Free T4: 1.68 ng/dL (ref 0.82–1.77)
TSH: 2.77 u[IU]/mL (ref 0.450–4.500)

## 2024-09-18 LAB — HEMOGLOBIN A1C
Est. average glucose Bld gHb Est-mCnc: 137 mg/dL
Hgb A1c MFr Bld: 6.4 % — ABNORMAL HIGH (ref 4.8–5.6)

## 2024-09-20 ENCOUNTER — Ambulatory Visit: Payer: Self-pay | Admitting: Internal Medicine

## 2024-12-16 ENCOUNTER — Ambulatory Visit: Payer: Self-pay | Admitting: Internal Medicine
